# Patient Record
Sex: Female | Born: 1940
Health system: Southern US, Community
[De-identification: ages and names within clinical notes are randomized; demographics above are authoritative.]

## PROBLEM LIST (undated history)

## (undated) DIAGNOSIS — G629 Polyneuropathy, unspecified: Secondary | ICD-10-CM

## (undated) DIAGNOSIS — R0600 Dyspnea, unspecified: Secondary | ICD-10-CM

## (undated) DIAGNOSIS — G4730 Sleep apnea, unspecified: Secondary | ICD-10-CM

## (undated) DIAGNOSIS — Z8719 Personal history of other diseases of the digestive system: Secondary | ICD-10-CM

## (undated) DIAGNOSIS — J449 Chronic obstructive pulmonary disease, unspecified: Secondary | ICD-10-CM

## (undated) DIAGNOSIS — D649 Anemia, unspecified: Secondary | ICD-10-CM

## (undated) DIAGNOSIS — Z87442 Personal history of urinary calculi: Secondary | ICD-10-CM

## (undated) DIAGNOSIS — Z8711 Personal history of peptic ulcer disease: Secondary | ICD-10-CM

## (undated) DIAGNOSIS — Z4542 Encounter for adjustment and management of neuropacemaker (brain) (peripheral nerve) (spinal cord): Secondary | ICD-10-CM

## (undated) DIAGNOSIS — R000 Tachycardia, unspecified: Secondary | ICD-10-CM

## (undated) DIAGNOSIS — M1990 Unspecified osteoarthritis, unspecified site: Secondary | ICD-10-CM

## (undated) DIAGNOSIS — K219 Gastro-esophageal reflux disease without esophagitis: Secondary | ICD-10-CM

## (undated) DIAGNOSIS — I82409 Acute embolism and thrombosis of unspecified deep veins of unspecified lower extremity: Secondary | ICD-10-CM

## (undated) DIAGNOSIS — I10 Essential (primary) hypertension: Secondary | ICD-10-CM

## (undated) HISTORY — PX: ARTHOSCOPIC ROTAOR CUFF REPAIR: SHX5002

## (undated) HISTORY — PX: EYE SURGERY: SHX253

## (undated) HISTORY — PX: APPENDECTOMY: SHX54

## (undated) HISTORY — PX: ABDOMINAL HYSTERECTOMY: SHX81

## (undated) HISTORY — PX: BACK SURGERY: SHX140

---
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
## 1964-05-11 HISTORY — PX: OTHER SURGICAL HISTORY: SHX169

## 1997-05-18 HISTORY — PX: BREAST BIOPSY: SHX20

## 1997-08-22 ENCOUNTER — Encounter: Admission: RE | Admit: 1997-08-22 | Discharge: 1997-11-20 | Payer: Self-pay | Admitting: Internal Medicine

## 1997-12-19 ENCOUNTER — Encounter: Admission: RE | Admit: 1997-12-19 | Discharge: 1998-03-19 | Payer: Self-pay | Admitting: Internal Medicine

## 1998-07-16 ENCOUNTER — Ambulatory Visit (HOSPITAL_COMMUNITY): Admission: RE | Admit: 1998-07-16 | Discharge: 1998-07-16 | Payer: Self-pay | Admitting: Obstetrics & Gynecology

## 1998-07-26 ENCOUNTER — Ambulatory Visit (HOSPITAL_COMMUNITY): Admission: RE | Admit: 1998-07-26 | Discharge: 1998-07-26 | Payer: Self-pay | Admitting: Neurosurgery

## 1998-07-26 ENCOUNTER — Encounter: Payer: Self-pay | Admitting: Neurosurgery

## 1998-08-08 ENCOUNTER — Ambulatory Visit (HOSPITAL_COMMUNITY): Admission: RE | Admit: 1998-08-08 | Discharge: 1998-08-08 | Payer: Self-pay | Admitting: Neurosurgery

## 1998-08-08 ENCOUNTER — Encounter: Payer: Self-pay | Admitting: Neurosurgery

## 1998-09-16 ENCOUNTER — Ambulatory Visit (HOSPITAL_COMMUNITY): Admission: RE | Admit: 1998-09-16 | Discharge: 1998-09-16 | Payer: Self-pay | Admitting: Neurosurgery

## 1998-09-16 ENCOUNTER — Encounter: Payer: Self-pay | Admitting: Neurosurgery

## 1998-09-24 ENCOUNTER — Encounter: Admission: RE | Admit: 1998-09-24 | Discharge: 1998-11-21 | Payer: Self-pay | Admitting: Neurosurgery

## 2001-10-17 ENCOUNTER — Ambulatory Visit: Admission: RE | Admit: 2001-10-17 | Discharge: 2001-10-17 | Payer: Self-pay | Admitting: Internal Medicine

## 2002-04-17 ENCOUNTER — Ambulatory Visit (HOSPITAL_COMMUNITY): Admission: RE | Admit: 2002-04-17 | Discharge: 2002-04-17 | Payer: Self-pay | Admitting: Gastroenterology

## 2002-05-11 HISTORY — PX: KNEE ARTHROSCOPY: SUR90

## 2003-10-26 ENCOUNTER — Encounter: Admission: RE | Admit: 2003-10-26 | Discharge: 2003-10-26 | Payer: Self-pay | Admitting: Internal Medicine

## 2003-11-14 ENCOUNTER — Inpatient Hospital Stay (HOSPITAL_COMMUNITY): Admission: RE | Admit: 2003-11-14 | Discharge: 2003-11-22 | Payer: Self-pay | Admitting: Neurosurgery

## 2003-12-13 ENCOUNTER — Encounter: Admission: RE | Admit: 2003-12-13 | Discharge: 2004-03-12 | Payer: Self-pay | Admitting: Neurosurgery

## 2004-03-13 ENCOUNTER — Encounter: Admission: RE | Admit: 2004-03-13 | Discharge: 2004-06-11 | Payer: Self-pay | Admitting: Neurosurgery

## 2005-05-10 ENCOUNTER — Encounter: Admission: RE | Admit: 2005-05-10 | Discharge: 2005-05-10 | Payer: Self-pay | Admitting: Internal Medicine

## 2007-12-21 ENCOUNTER — Ambulatory Visit (HOSPITAL_COMMUNITY): Admission: RE | Admit: 2007-12-21 | Discharge: 2007-12-22 | Payer: Self-pay | Admitting: Orthopedic Surgery

## 2008-01-23 ENCOUNTER — Encounter: Admission: RE | Admit: 2008-01-23 | Discharge: 2008-04-03 | Payer: Self-pay | Admitting: Orthopedic Surgery

## 2010-01-21 ENCOUNTER — Ambulatory Visit (HOSPITAL_COMMUNITY): Admission: RE | Admit: 2010-01-21 | Discharge: 2010-01-21 | Payer: Self-pay | Admitting: Orthopedic Surgery

## 2010-03-20 ENCOUNTER — Ambulatory Visit (HOSPITAL_COMMUNITY): Admission: RE | Admit: 2010-03-20 | Discharge: 2010-03-20 | Payer: Self-pay | Admitting: Gastroenterology

## 2010-05-11 HISTORY — PX: JOINT REPLACEMENT: SHX530

## 2010-07-22 LAB — GLUCOSE, CAPILLARY
Glucose-Capillary: 120 mg/dL — ABNORMAL HIGH (ref 70–99)
Glucose-Capillary: 56 mg/dL — ABNORMAL LOW (ref 70–99)

## 2010-07-24 LAB — COMPREHENSIVE METABOLIC PANEL
ALT: 20 U/L (ref 0–35)
AST: 24 U/L (ref 0–37)
Albumin: 3.8 g/dL (ref 3.5–5.2)
Alkaline Phosphatase: 44 U/L (ref 39–117)
BUN: 20 mg/dL (ref 6–23)
CO2: 28 mEq/L (ref 19–32)
Calcium: 10 mg/dL (ref 8.4–10.5)
Chloride: 106 mEq/L (ref 96–112)
Creatinine, Ser: 1.22 mg/dL — ABNORMAL HIGH (ref 0.4–1.2)
GFR calc Af Amer: 53 mL/min — ABNORMAL LOW (ref >60)
GFR calc non Af Amer: 44 mL/min — ABNORMAL LOW (ref >60)
Glucose, Bld: 106 mg/dL — ABNORMAL HIGH (ref 70–99)
Potassium: 4.3 mEq/L (ref 3.5–5.1)
Sodium: 141 mEq/L (ref 135–145)
Total Bilirubin: 0.6 mg/dL (ref 0.3–1.2)
Total Protein: 6.9 g/dL (ref 6.0–8.3)

## 2010-07-24 LAB — URINALYSIS, ROUTINE W REFLEX MICROSCOPIC
Bilirubin Urine: NEGATIVE
Glucose, UA: NEGATIVE mg/dL
Hgb urine dipstick: NEGATIVE
Ketones, ur: NEGATIVE mg/dL
Nitrite: NEGATIVE
Protein, ur: NEGATIVE mg/dL
Specific Gravity, Urine: 1.022 (ref 1.005–1.030)
Urobilinogen, UA: 0.2 mg/dL (ref 0.0–1.0)
pH: 8 (ref 5.0–8.0)

## 2010-07-24 LAB — CBC
HCT: 35.8 % — ABNORMAL LOW (ref 36.0–46.0)
HCT: 36.8 % (ref 36.0–46.0)
Hemoglobin: 11.9 g/dL — ABNORMAL LOW (ref 12.0–15.0)
Hemoglobin: 12.6 g/dL (ref 12.0–15.0)
MCH: 27.6 pg (ref 26.0–34.0)
MCH: 27.9 pg (ref 26.0–34.0)
MCHC: 33.3 g/dL (ref 30.0–36.0)
MCHC: 34.3 g/dL (ref 30.0–36.0)
MCV: 81.4 fL (ref 78.0–100.0)
MCV: 82.7 fL (ref 78.0–100.0)
Platelets: 256 10*3/uL (ref 150–400)
Platelets: 270 10*3/uL (ref 150–400)
RBC: 4.33 MIL/uL (ref 3.87–5.11)
RBC: 4.53 MIL/uL (ref 3.87–5.11)
RDW: 14.6 % (ref 11.5–15.5)
RDW: 14.6 % (ref 11.5–15.5)
WBC: 7.3 10*3/uL (ref 4.0–10.5)
WBC: 8.2 10*3/uL (ref 4.0–10.5)

## 2010-07-24 LAB — DIFFERENTIAL
Basophils Absolute: 0 10*3/uL (ref 0.0–0.1)
Basophils Relative: 0 % (ref 0–1)
Eosinophils Absolute: 0.2 10*3/uL (ref 0.0–0.7)
Eosinophils Relative: 3 % (ref 0–5)
Lymphocytes Relative: 35 % (ref 12–46)
Lymphs Abs: 2.5 10*3/uL (ref 0.7–4.0)
Monocytes Absolute: 0.7 10*3/uL (ref 0.1–1.0)
Monocytes Relative: 9 % (ref 3–12)
Neutro Abs: 3.8 10*3/uL (ref 1.7–7.7)
Neutrophils Relative %: 52 % (ref 43–77)

## 2010-07-24 LAB — SURGICAL PCR SCREEN
MRSA, PCR: NEGATIVE
Staphylococcus aureus: NEGATIVE

## 2010-07-24 LAB — PROTIME-INR
INR: 0.84 (ref 0.00–1.49)
Prothrombin Time: 11.7 seconds (ref 11.6–15.2)

## 2010-07-24 LAB — APTT: aPTT: 27 seconds (ref 24–37)

## 2010-07-24 LAB — GLUCOSE, CAPILLARY: Glucose-Capillary: 114 mg/dL — ABNORMAL HIGH (ref 70–99)

## 2010-09-23 NOTE — Op Note (Signed)
NAMEJORDIS, [name]             ACCOUNT NO.:  [id number]   MEDICAL RECORD NO.:  [id number]          PATIENT TYPE:  AMB   LOCATION:  DAY                          FACILITY:  [facility]   PHYSICIAN:  Ronald A. Gioffre, M.D.DATE OF BIRTH:  February 24, 1941   DATE OF PROCEDURE:  12/21/2007  DATE OF DISCHARGE:                               OPERATIVE REPORT   SURGEON:  Dr. Darrelyn Hillock   ASSISTANT:  Arlyn Leak, PA.   PREOPERATIVE DIAGNOSIS:  1. Severe impingement syndrome, left shoulder.  2. Torn rotator cuff tendon, right shoulder.   POSTOPERATIVE DIAGNOSIS:  1. Severe impingement syndrome, left shoulder.  2. Torn rotator cuff tendon, right shoulder.   OPERATION:  1. Partial acromionectomy and acromioplasty, left shoulder.  2. Repair of a torn rotator cuff tendon.  She had a central tear.  3. A tissue mend graft to reinforce the tear of the rotator cuff, left      shoulder.  4. One PEEK anchor was used.   PROCEDURE:  Under general anesthesia, routine orthopedic prep and drape  of the left shoulder was carried out.  Prior to being brought back to  the operating room, she had an interscalene nerve block.  At that time  she also had 1 gram of IV Ancef.  After sterile prep and drape was  carried out, an incision was made down the anterior aspect of the left  shoulder.  Bleeders were identified and cauterized.  I separated the  deltoid tendon from the acromion by sharp dissection.  Following that, I  protected the cuff and I partially incised the proximal part of the  deltoid muscle.  I went down and excised the subdeltoid bursa, which was  quite thickened and full of fluid.  I then went down and noted a large  tear in the rotator cuff.  It was actually a central-type tear.  I  gently retracted the cuff with the retractors and then utilized a bur to  bur down the lateral articular surface of the humerus.  Once a good  bleeding bone bed was established, I then irrigated the area out.  Prior  to  doing that we did a routine acromioplasty and a partial  acromionectomy utilizing an oscillating saw and a bur.  We did protect  the cuff during that procedure.  We thoroughly irrigated out the area as  well.  I then inserted 1 PEEK anchor with 4 sutures.  I brought 2  sutures up through the cuff and sutured the cuff down to the bone and  then reinforced the cuff repair with a tissue mend graft utilizing the  other 2 sutures from the anchor.  We had a nice repair.  We thoroughly  repaired the tendon  circumferentially.  I thoroughly irrigated out the area.  We now had  good reestablishment of the subacromial space.  I then reapproximated  the deltoid tendon and muscle in the usual fashion.  The subcu was  closed with 0 Vicryl, the skin with metal staples.  A sterile Neosporin  dressing was applied.  She was placed in a shoulder immobilizer.  ______________________________  Georges Lynch Darrelyn Hillock, M.D.     RAG/MEDQ  D:  12/21/2007  T:  12/21/2007  Job:  [id number]   cc:   Windy Fast A. Darrelyn Hillock, M.D.  Fax: [phone]

## 2010-09-26 NOTE — H&P (Signed)
[name], [name]                         ACCOUNT NO.:  [id number]   MEDICAL RECORD NO.:  [id number]                   PATIENT TYPE:  INP   LOCATION:  2899                                 FACILITY:  [facility]   PHYSICIAN:  Hilda Lias, M.D.                DATE OF BIRTH:  07/05/1940   DATE OF ADMISSION:  11/14/2003  DATE OF DISCHARGE:                                HISTORY & PHYSICAL   CLINICAL HISTORY:  Ms. [name] is a lady who has been seen by me since 1997.  At that time, she underwent a L4-5 diskectomy. She came to see me  complaining of worsening back pain radiation to both her legs, right worse  than the left one. The patient has had conservative treatment, and she is  not any better. X-rays were obtained. Because of the findings, she wanted to  proceed with surgery.   PAST MEDICAL HISTORY:  1. L4-5 diskectomy in 1997.  2. Hysterectomy.  3. Tonsillectomy.  4. Ovarian resection.   ALLERGIES:  She is allergic to HYDROCHLOROTHIAZIDE, PERCOCET. She is also  allergic to CODEINE.   SOCIAL HISTORY:  Negative.   FAMILY HISTORY:  Mother died of cerebral hemorrhage. Father died of coronary  artery thrombosis.   REVIEW OF SYSTEMS:  Positive for back and bilateral leg pain.   PHYSICAL EXAMINATION:  GENERAL:  The patient came to my office with her  husband. She was walking with a short step. She had difficulty sitting or  standing.  HEENT:  Head, ears, and throat normal.  NECK:  Normal.  LUNGS:  Clear.  HEART SOUNDS:  Normal.  ABDOMEN:  Normal.  EXTREMITIES:  Normal pulses.  NEUROLOGICAL:  Mental status normal. Cranial nerves normal. Strength 5/5 in  the upper and lower extremities except for weakness and dorsi flexion of  both feet. Straight leg raising is positive bilaterally 45 degrees.  Sensation is normal. She has decrease in flexibility of the lumbar spine.   STUDIES:  X-rays show severe case of degenerative disk disease at the level  of 4-5 and 5-1. The MRI showed  _____________ with no evidence of any  spondylolisthesis.   CLINICAL IMPRESSION:  Degenerative disk disease, 4-5/5-1, with bilateral  radiculopathy.   RECOMMENDATIONS:  The patient is being admitted for L4-5/5-1 diskectomy  fusion with pedicle screws. She and her husband know about the risks such as  infection, CSF leak, worsening pain, damage to the vessel in the abdomen,  failure of the bone graft, need for further surgery.                                                Hilda Lias, M.D.    EB/MEDQ  D:  11/14/2003  T:  11/14/2003  Job:  [id number]

## 2010-09-26 NOTE — Op Note (Signed)
   NAMEMARIACHRISTINA, [name]                         ACCOUNT NO.:  [id number]   MEDICAL RECORD NO.:  [id number]                   PATIENT TYPE:  AMB   LOCATION:  ENDO                                 FACILITY:  [facility]   PHYSICIAN:  Danise Edge, M.D.                DATE OF BIRTH:  1940-09-01   DATE OF PROCEDURE:  04/17/2002  DATE OF DISCHARGE:                                 OPERATIVE REPORT   PROCEDURE PERFORMED:  Colonoscopy.   REFERRING PHYSICIAN:  Candyce Churn, M.D.   ENDOSCOPIST:  Charolett Bumpers, M.D.   INDICATIONS FOR PROCEDURE:  The patient is a 70 year old female.  The  patient submitted stool cards for Hemoccult testing; one of six stool cards  was positive for blood.   PREMEDICATION:  Demerol 50 mg, Versed 10 mg.   INSTRUMENT USED:  Olympus pediatric colonoscope.   DESCRIPTION OF PROCEDURE:  After obtaining informed consent, the patient was  placed in the left lateral decubitus position.  I administered intravenous  Demerol and intravenous Versed to achieve conscious sedation for the  procedure.  The patient's blood pressure, oxygen saturations and cardiac  rhythm were monitored throughout the procedure and documented in the medical  record.   Anal inspection was normal.  Digital rectal exam was normal.  The pediatric  Olympus video colonoscope was introduced into the rectum and advanced to the  cecum.  Colonic preparation for the exam today was satisfactory.   Rectum:  Normal.   Sigmoid colon and descending colon:  Normal.   Splenic flexure:  Normal.   Transverse colon:  Normal.   Hepatic flexure:  Normal.   Ascending colon:  Normal.   Cecum and ileocecal valve:  Normal.    ASSESSMENT:  Normal proctocolonoscopy to the cecum.  No endoscopic evidence  for the presence of lower gastrointestinal bleeding or colorectal neoplasia.                                                   Danise Edge, M.D.    MJ/MEDQ  D:  04/17/2002  T:  04/17/2002   Job:  [id number]

## 2010-09-26 NOTE — Op Note (Signed)
NAMEJAYLIANI, [name]                         ACCOUNT NO.:  [id number]   MEDICAL RECORD NO.:  [id number]                   PATIENT TYPE:  INP   LOCATION:  3172                                 FACILITY:  [facility]   PHYSICIAN:  Hilda Lias, M.D.                DATE OF BIRTH:  06/13/1940   DATE OF PROCEDURE:  11/14/2003  DATE OF DISCHARGE:                                 OPERATIVE REPORT   PREOPERATIVE DIAGNOSIS:  Degenerative disk disease, 4-5, 5-1, with bilateral  radiculopathy, right worse than the left one.   POSTOPERATIVE DIAGNOSIS:  Degenerative disk disease, 4-5, 5-1, with  bilateral radiculopathy, right worse than the left one.   PROCEDURE:  1. Bilateral 4-5 laminectomy.  2. Bilateral 4-5 diskectomy.  3. Interbody fusion with allograft 4-5.  4. Foraminotomy.  5. Facetectomy.  6. Decompression of the L4, L5, S1 nerve root.  7. Lysis of adhesions.  8. Pedicle screws from L4 to S1 bilaterally with crosslink.  9. Posterolateral arthrodesis without doing allograft.  10.      Cell-saver.  11.      C-arm.   SURGEON:  Dr. Jeral Fruit   ASSISTANT:  Dr. Danielle Dess.   CLINICAL HISTORY:  The patient was admitted because of back pain, radiating  down to both legs.  The patient in 1977 underwent a 5-1 diskectomy on the  right side.  The patient has a severe case of degenerative disk disease with  chronic radiculopathy.  The patient wants to proceed with surgery.  The  risks were explained in the history and physical.   DESCRIPTION OF PROCEDURE:  The patient was taken to the OR and after  intubation, she was positioned in a prone manner.  The back was prepped with  Betadine.  A midline incision following the previous one was made from L4 to  S1.  Muscle was retracted laterally.  We proceeded with removal of spinous  process and lamina of 4 and 5.  This was done with the Leksell as well as  the drill.  Indeed, we found quite a bit of adhesion.  Although the patient  had surgery on the  right side, the patient had more adhesions in the left  side.  Decompression of the L4, L5, S1 nerve root was done.  Facetectomy was  achieved.  We knew by x-ray that she was almost completely fused at L5-S1.  From then on, we entered the disk space bilaterally, and a total diskectomy  was achieved.  Having done this, the endplate was removed with a curette.  A  piece of bone graft of 8 x 24 was introduced bilaterally and in the midline  as well as laterally.  A mix of allograft and VITOSS was used to fill up the  disk space.  From then on, using the C-arm, we identified the pedicle screw  for 4, 5, and 1.  Cobb was used, and the 6 screws ,  4 being of 6.5 x 45 and  1 of 5.5 x 45 were introduced.  AP and lateral showed good position of the  bone graft as well as the pedicle screws.  Then a rod was applied followed  by caps.  These were tightened in place.  Then a crosslink from right to  left was used.  Then we identified the ala of the sacrum as well as the  transverse process of 4-5.  Within this area, a mix of VITOSS as well as  autograft was used posterolaterally through this.  Having good  decompression, the ___________.  There was a small opening in the dura which  was taken care of with Prolene.  Tisseal was used in the dural space.  From  then on, Valsalva maneuver was negative, and the wound was closed with  Vicryl and Steri-Strips.  The patient did really well.                                               Hilda Lias, M.D.    EB/MEDQ  D:  11/14/2003  T:  11/14/2003  Job:  [id number]

## 2010-09-26 NOTE — Discharge Summary (Signed)
[name], [name]                         ACCOUNT NO.:  [id number]   MEDICAL RECORD NO.:  [id number]                   PATIENT TYPE:  INP   LOCATION:  3009                                 FACILITY:  [facility]   PHYSICIAN:  Hilda Lias, M.D.                DATE OF BIRTH:  02/03/1941   DATE OF ADMISSION:  11/14/2003  DATE OF DISCHARGE:  11/22/2003                                 DISCHARGE SUMMARY   ADMISSION DIAGNOSES:  Degenerative disc disease of L4-L5, L5-S1 with  bilateral radiculopathy.   FINAL DIAGNOSES:  1. Degenerative disc disease L4-L5, L5-S1 with bilateral radiculopathy.  2. Hyperglycemia and hypoglycemia, diabetes mellitus.  3. Hypertension.   HISTORY OF PRESENT ILLNESS:  The patient is a lady who underwent L4-L5  diskectomy on the right side back in 1999.  Lately, for the past year, the  patient has been complaining of back pain with radiation mostly to the right  leg.  The patient has failed conservative treatment.  The patient had x-rays  which showed degenerative disc disease at the L4-L5 and L5-S1 level.   LABORATORY DATA:  At the present time white blood cells 7.1, hemoglobin  12.1.  His urine is pending.  Initially glucose on admission was 129 and  during hospitalization the glucose went from 250 to 360.  ___________  yesterday, came down to 113.   HOSPITAL COURSE:  The patient was taken to surgery and L4-L5, L5-S1  diskectomy with interbody fusion was done.  During surgery we found she had  quite a bit scar tissue, interestingly, mostly in the left side.  Lysis was  accomplished.  After surgery the patient had no pain but she had 0-1  weakness dorsiflexion of the left foot.  She also complained of numbness  affecting the L5 nerve root.  With the use of Decadron we were able to  decrease the swelling.  Because the patient did not improve I did a CT scan  of the lumbar area which showed the possibility of epidural hematoma.  Interesting of note the so-called  epidural hematoma was in the left side,  not in the right side.  I was suspicious that this might be the glue that we  used during surgery to cover the dural matter.  Because the patient did not  improve, I took her back to surgery on Saturday, November 17, 2003.  We invested  the area with more lysis of adhesions and indeed there was no evidence of an  epidural hematoma.  All the screws were in place and there was no evidence  of any screw close to the nerve.  After surgery she was continued on  Decadron and her glucose continued to fluctuate, going over 350.  She had  been seen by physical therapy.  A special brace was made for her left foot.  The patient, for the past few days, has fluctuated with her glucose levels  and thus this was the main reason we decided to keep her until today to be  sure she did not develop any hyperglycemia or hypoglycemia situation which  might cause her to lose consciousness, fall and damage her spine.  Right now  her glucose is under control and therefore she is being discharged today.   CONDITION ON DISCHARGE:  Improved in relationship to her pain, still some  numbness in the foot with weakness of dorsiflexion of the left.   DISCHARGE MEDICATIONS:  Patient will continue to take Demerol, Neurontin and  Flexeril.  She is going to continue taking her medications for diabetes and  blood pressure.   ACTIVITY:  Patient is encouraged to walk and she is going to be seen by  physical therapy at home.   DIET:  She is to continue with her ADA diet.   FOLLOW UP:  She is going to come to my office in four weeks in follow up.  She is going to give me a call in two weeks to let me know how she is coming  along.  If she continues to have burning we are going to increase the  Neurontin from 300 mg three times a day to 600 mg three times a day.  Again,  the reason for this lady to be kept in the hospital two extra days was to  prevent the possibility of changes in mental  status secondary to  hypoglycemia.   Incidentally, this morning, November 22, 2003, she had an episode where she went  down onto the floor and she was helped back into her bed by personnel.                                                Hilda Lias, M.D.    EB/MEDQ  D:  11/22/2003  T:  11/22/2003  Job:  [id number]

## 2010-09-26 NOTE — Op Note (Signed)
[name], [name]                         ACCOUNT NO.:  [id number]   MEDICAL RECORD NO.:  [id number]                   PATIENT TYPE:  INP   LOCATION:  3009                                 FACILITY:  [facility]   PHYSICIAN:  Hilda Lias, M.D.                DATE OF BIRTH:  1940/06/27   DATE OF PROCEDURE:  11/17/2003  DATE OF DISCHARGE:                                 OPERATIVE REPORT   PREOPERATIVE DIAGNOSIS:  Status post L4-5, L5-S1 fusion.  Weakness of the  left foot.  Rule out epidural hematoma.   POSTOPERATIVE DIAGNOSIS:  Status post L4-5, L5-S1 fusion.  Lysis of  adhesions.  Microscope.   OPERATION PERFORMED:   SURGEON:  Hilda Lias, M.D.   ASSISTANT:  Coletta Memos, M.D.   ANESTHESIA:  General.   INDICATIONS FOR PROCEDURE:  [name] was admitted because of back pain  with radiation to both legs, right worse than the left one.  Previously she  had surgery  back in 1999.  Fusion at the level of 4-5, 5-1 with pedicle  screw was done.  We found quite a bit of scar tissue, mostly in the left  side involving the L5 nerve root and the S1.  Nevertheless, she also had  some in the right side where she had previously surgery.  During the  procedure there was a small opening in the dura mater and a single stitch of  Prolene was used.  After surgery, the patient developed 1/5 weakness of  dorsiflexion on the left foot with normal plantar flexion. We waited 48  hours to see if that would improve.  Because of no improvement, a CT scan of  the lumbar spine was done.  It was really difficult to read. It was read by  Dr. Sherrie Mustache but later on I saw him with Dr. Tyron Russell and Dr. Lyman Bishop.  They  advised to do an MRI.  Knowing that probably MRI won't be of any help, I  talked to the patient and we agreed with exploration.   DESCRIPTION OF PROCEDURE:  The patient was taken to the operating room and  was positioned in prone manner.  The back was prepped with Betadine.  Midline incision  was made.  There was a small amount of serosanguineous  fluid which was removed.  Indeed, we brought the microscope into the area.  There was no evidence of any hematoma.  We did the Valsalva maneuver which  was essentially negative. There was some scar tissue right underneath the L5  nerve root and that was removed.  The foramen was opened not only for the L4  but also L5-S1.  The pedicle screw were in good position.  Then with the  help of the microscope we removed the stitch of Prolene which was right at  the take off of the L5 nerve root.  Indeed we explored and also there was  some scar tissue  there was no major compromise of the L5 nerve root.  The  dura mater was seen.  In the dural sac itself we put a single stitch of  Prolene to stop the CSF leak. In the area where the nerve root was going  through the foramen we decided not to put any stitch but we used Duragen to  cover the nerve root.  On top of that we used Tisseel.  Then the Valsalva  maneuver was negative and the wound was closed with Vicryl and Steri-Strip.                                               Hilda Lias, M.D.    EB/MEDQ  D:  11/17/2003  T:  11/19/2003  Job:  [id number]

## 2010-09-26 NOTE — Consult Note (Signed)
NAMEJULETTA, [name]                         ACCOUNT NO.:  [id number]   MEDICAL RECORD NO.:  [id number]                   PATIENT TYPE:  INP   LOCATION:  3009                                 FACILITY:  [facility]   PHYSICIAN:  Hilda Lias, M.D.                DATE OF BIRTH:  Apr 21, 1941   DATE OF CONSULTATION:  11/16/2003  DATE OF DISCHARGE:                                   CONSULTATION   [name] is a lady who was taken to surgery on July 6 by me for L4-5, L5-  S1 diskectomy with fusion.  Dr. Barnett Abu helped me during the case.  The  procedure went really well, although she had surgery previously at the level  of 4-5 on the right side with mostly scar tissue on the left side affecting  the S1 nerve root.  The procedure was done with fluoroscopy, and the pedicle  screws were in the normal posthion.  After surgery, the patient came back  complaining of numbness and weakness of the left foot which was 1 to 2/5 and  eventually right now is 1/5.  She denies any back pain or any pain in the  right leg.  There is no shooting pain in the left leg.  She denies any  burning sensation in the left foot.  Although during surgery we had to do  quite a bit of reduction of the L5 nerve root; nevertheless, I have been  concerned about the weakness in the left foot.  Today, knowing that the CT  scan can be a little bit misleading, I did a CT scan of the lumbar spine  which showed that the pedicle screws were in normal position except that  probably the L4 might be a little bit medial.  Nevertheless, it is not  affecting either the L4 or L5 nerve root.  The one at the L5-S1 is normal.   Dr. Sherrie Mustache, from the x-ray department, called me while I was in surgery to  let me know that the CT scan showed a possibility of epidural hematoma at  the level of 4-5 and 5-1 on the left side.  Since I knew after surgery I  left Tisseel in the epidural space, I was concerned that what they were  calling a  hematoma was probably the Tisseel.  After surgery, I went down.  I  talked to Dr. Lyman Bishop and to Dr. Tyron Russell.  We looked at the CT scan in the  hospital location.  It is difficult to say if indeed this is an epidural  hematoma or might be the Tisseel in that area.  One thing works against  epidural hematoma is the location is midline to the left.  It does not go to  the right side.  We talked about doing an MRI and probably the MRI might  give [place] a clue about what is going on.  Nevertheless, I was  really concerned  that doing the MRI we might end up having the same confusion about what is  going on in the epidural space.   I came to [name].  She just finished eating dinner.  I explained to her  what my dilemma is.  I told her repeating the MRI might be helpful, but it  might add to the confusion about the epidural space.  I told her that in one  way I would like to wait and see how she develops.  On the other hand, I  will be disappointed to sit and do nothing if this is indeed an epidural  hematoma.  Finally, looking at the pros and cons, we agreed to go ahead with  a exploration at level 4-5 tomorrow morning.  This procedure will not take  more than one-half hour since we are not going to be doing any changes in  the bone graft and pedicle screws.  I tried three times to see if I was able  to get hold of her husband, but he was not around.  She will be talking to  him, and if any questions, they will let me know.  I will be seeing her  again tomorrow morning.                                               Hilda Lias, M.D.    EB/MEDQ  D:  11/16/2003  T:  11/17/2003  Job:  [id number]

## 2010-10-23 ENCOUNTER — Other Ambulatory Visit: Payer: Self-pay | Admitting: Orthopedic Surgery

## 2010-10-23 ENCOUNTER — Other Ambulatory Visit (HOSPITAL_COMMUNITY): Payer: Self-pay | Admitting: Orthopedic Surgery

## 2010-10-23 ENCOUNTER — Encounter (HOSPITAL_COMMUNITY): Payer: Medicare Other

## 2010-10-23 ENCOUNTER — Ambulatory Visit (HOSPITAL_COMMUNITY)
Admission: RE | Admit: 2010-10-23 | Discharge: 2010-10-23 | Disposition: A | Discharge status: Home / Self Care | Payer: Medicare Other | Source: Ambulatory Visit | Attending: Orthopedic Surgery | Admitting: Orthopedic Surgery

## 2010-10-23 DIAGNOSIS — Z01810 Encounter for preprocedural cardiovascular examination: Secondary | ICD-10-CM | POA: Insufficient documentation

## 2010-10-23 DIAGNOSIS — M1711 Unilateral primary osteoarthritis, right knee: Secondary | ICD-10-CM

## 2010-10-23 DIAGNOSIS — E119 Type 2 diabetes mellitus without complications: Secondary | ICD-10-CM | POA: Insufficient documentation

## 2010-10-23 DIAGNOSIS — I10 Essential (primary) hypertension: Secondary | ICD-10-CM | POA: Insufficient documentation

## 2010-10-23 DIAGNOSIS — Z01812 Encounter for preprocedural laboratory examination: Secondary | ICD-10-CM | POA: Insufficient documentation

## 2010-10-23 DIAGNOSIS — M1710 Unilateral primary osteoarthritis, unspecified knee: Secondary | ICD-10-CM | POA: Insufficient documentation

## 2010-10-23 LAB — COMPREHENSIVE METABOLIC PANEL
ALT: 15 U/L (ref 0–35)
AST: 20 U/L (ref 0–37)
Albumin: 3.3 g/dL — ABNORMAL LOW (ref 3.5–5.2)
Alkaline Phosphatase: 51 U/L (ref 39–117)
BUN: 23 mg/dL (ref 6–23)
CO2: 26 mEq/L (ref 19–32)
Calcium: 10 mg/dL (ref 8.4–10.5)
Chloride: 103 mEq/L (ref 96–112)
Creatinine, Ser: 1.02 mg/dL (ref 0.4–1.2)
GFR calc Af Amer: >60 mL/min (ref >60)
GFR calc non Af Amer: 54 mL/min — ABNORMAL LOW (ref >60)
Glucose, Bld: 78 mg/dL (ref 70–99)
Potassium: 4.1 mEq/L (ref 3.5–5.1)
Sodium: 139 mEq/L (ref 135–145)
Total Bilirubin: 0.3 mg/dL (ref 0.3–1.2)
Total Protein: 7.1 g/dL (ref 6.0–8.3)

## 2010-10-23 LAB — URINALYSIS, ROUTINE W REFLEX MICROSCOPIC
Bilirubin Urine: NEGATIVE
Glucose, UA: NEGATIVE mg/dL
Hgb urine dipstick: NEGATIVE
Ketones, ur: NEGATIVE mg/dL
Leukocytes, UA: NEGATIVE
Nitrite: NEGATIVE
Protein, ur: NEGATIVE mg/dL
Specific Gravity, Urine: 1.02 (ref 1.005–1.030)
Urobilinogen, UA: 0.2 mg/dL (ref 0.0–1.0)
pH: 5.5 (ref 5.0–8.0)

## 2010-10-23 LAB — PROTIME-INR
INR: 0.95 (ref 0.00–1.49)
Prothrombin Time: 12.9 seconds (ref 11.6–15.2)

## 2010-10-23 LAB — APTT: aPTT: 29 seconds (ref 24–37)

## 2010-10-23 LAB — SURGICAL PCR SCREEN
MRSA, PCR: NEGATIVE
Staphylococcus aureus: NEGATIVE

## 2010-10-29 ENCOUNTER — Inpatient Hospital Stay (HOSPITAL_COMMUNITY): Payer: Medicare Other

## 2010-10-29 ENCOUNTER — Inpatient Hospital Stay (HOSPITAL_COMMUNITY)
Admission: RE | Admit: 2010-10-29 | Discharge: 2010-11-01 | DRG: 470 | Disposition: A | Discharge status: Home Health | Payer: Medicare Other | Source: Ambulatory Visit | Attending: Orthopedic Surgery | Admitting: Orthopedic Surgery

## 2010-10-29 DIAGNOSIS — D62 Acute posthemorrhagic anemia: Secondary | ICD-10-CM | POA: Diagnosis not present

## 2010-10-29 DIAGNOSIS — E669 Obesity, unspecified: Secondary | ICD-10-CM | POA: Diagnosis present

## 2010-10-29 DIAGNOSIS — E119 Type 2 diabetes mellitus without complications: Secondary | ICD-10-CM | POA: Diagnosis present

## 2010-10-29 DIAGNOSIS — M1710 Unilateral primary osteoarthritis, unspecified knee: Principal | ICD-10-CM | POA: Diagnosis present

## 2010-10-29 DIAGNOSIS — Z01812 Encounter for preprocedural laboratory examination: Secondary | ICD-10-CM

## 2010-10-29 DIAGNOSIS — K219 Gastro-esophageal reflux disease without esophagitis: Secondary | ICD-10-CM | POA: Diagnosis present

## 2010-10-29 DIAGNOSIS — Z86718 Personal history of other venous thrombosis and embolism: Secondary | ICD-10-CM

## 2010-10-29 DIAGNOSIS — I10 Essential (primary) hypertension: Secondary | ICD-10-CM | POA: Diagnosis present

## 2010-10-29 LAB — GLUCOSE, CAPILLARY
Glucose-Capillary: 137 mg/dL — ABNORMAL HIGH (ref 70–99)
Glucose-Capillary: 186 mg/dL — ABNORMAL HIGH (ref 70–99)
Glucose-Capillary: 215 mg/dL — ABNORMAL HIGH (ref 70–99)
Glucose-Capillary: 195 mg/dL — ABNORMAL HIGH (ref 70–99)
Glucose-Capillary: 154 mg/dL — ABNORMAL HIGH (ref 70–99)

## 2010-10-29 LAB — TYPE AND SCREEN
ABO/RH(D): B POS
Antibody Screen: NEGATIVE

## 2010-10-29 NOTE — H&P (Addendum)
[name], [name]             ACCOUNT NO.:  [id number]  MEDICAL RECORD NO.:  [id number]  LOCATION:  0003                         FACILITY:  [facility]  PHYSICIAN:  Georges Lynch. Ailana Cuadrado, M.D.DATE OF BIRTH:  August 02, 1940  DATE OF ADMISSION:  10/29/2010 DATE OF DISCHARGE:                             HISTORY & PHYSICAL   BRIEF HISTORY:  [name] has been followed for worsening pain in her right knee by Dr. Darrelyn Hillock.  She has undergone conservative measures such as injections with Celestone as well as Depo-Medrol.  Unfortunately she is getting less and less relief with these injections.  She feels she is also having increased difficulty with her daily activities and the knee pain is significantly limiting her activity.  [name] now presents for a right total knee arthroplasty.  ALLERGIES: 1. VIOXX. 2. REZULIN. 3. CYMBALTA. 4. HYDROCHLOROTHIAZIDE. 5. INDOCIN. 6. LYRICA. 7. NEOSPORIN. 8. VYTORIN. 9. ZOCOR.  CURRENT MEDICATIONS: 1. Metaglip. 2. Niacinamide. 3. Omeprazole 4. Allegra 5. Fish oil 6. Coenzyme Q10. 7. Crestor. 8. Levemir FlexPen. 9. Vitamin D. 10.Multivitamin with iron 11.Diovan 12.Byetta, 13.Estradiol. 14.Vicodin.  PRIMARY CARE PHYSICIAN:  Candyce Churn, MD  PAST MEDICAL HISTORY: 1. End-stage arthritis of the right knee. 2. History of bronchitis. 3. Hypertension. 4. Hyperlipidemia. 5. Reflux disease. 6. Gastric ulceration secondary to NSAID usage. 7. History of urinary tract infection, the last one was 3 to 4 years     ago. 8. History of kidney stones 15 to 20 years ago. 9. Non-insulin dependent diabetes. 10.History of DVT back in 2003 following a long flight 11.Arthritis 12.Degenerative disk disease. 13.History of measles, mumps and rubella as a child 80.History of menopause.  PAST SURGICAL HISTORY: 1. Right knee arthroscopy in 2011 2. Rotator cuff repair in 2009. 3. Spinal implants in 2003. 4. Vasectomy in 1999. 5. Partial  hysterectomy and then full hysterectomy. 6. Ovarian wedge resection. 7. Tonsillectomy in 1959.  She has done fine with anesthesia was all     of her surgeries.  FAMILY HISTORY:  Father passed at age of 34, had coronary thrombosis. Mother passed at age of 70, she had a brain aneurysm.  SOCIAL HISTORY:  The patient is married, retired.  Denies past or present use of alcohol or tobacco products.  She does plan to go home following her hospital stay.  So we will arrange for physical therapy to come out to the house.  REVIEW OF SYSTEMS:  GENERAL:  Negative for fevers, chills or weight change.  HEENT/NEURO:  Negative for headache, blurred vision or insomnia.  DERMATOLOGIC:  Positive for hives.  Negative for rash or lesion.  RESPIRATORY:  Negative for shortness of breath at rest or with exertion.  CARDIOVASCULAR:  Negative for chest pain or palpitations. GI:  Negative for nausea, vomiting, diarrhea.  GU: Negative for hematuria, dysuria.  MUSCULOSKELETAL:  Positive for muscular pain and back pain.  PHYSICAL EXAMINATION:  VITAL SIGNS:  Pulse 80, respirations 18, blood pressure 138/80 in the left arm. GENERAL:  [name] is alert and oriented x3.  She is well developed, well nourished, in no apparent distress.  She is a pleasant 70 year old female.  She has a stated height of 5 feet 6 inches  and stated weight of 190 pounds. HEENT:  Normocephalic, atraumatic.  Extraocular movements intact.  The patient wears reading glasses. NECK:  Supple full range of motion without lymphadenopathy. CHEST:  Lungs are clear to auscultation bilaterally without wheezes, rhonchi or rales. HEART:  Regular rate and rhythm without murmur. ABDOMEN:  Bowel sounds present in all 4 quadrants.  Abdomen is obese and nontender to palpation. EXTREMITIES:  Right knee, she has mild effusion, pain with palpation over the medial joint line.  No instability is noted.  She has marked crepitus throughout the range of motion.   Range is 5 to 100 degrees. SKIN:  Unremarkable. NEUROLOGIC:  Intact.  Peripheral vascular, carotid pulses 2+ bilaterally without bruit.  RADIOGRAPHS:  AP and lateral views of the right knee reveal end-stage arthritic changes tricompartmentally.  IMPRESSION:  End-stage arthritis of the right knee.  PLAN:  Right total knee arthroplasty to be performed by Dr. Darrelyn Hillock.     Rozell Searing, PAC   ______________________________ Georges Lynch Darrelyn Hillock, M.D.    LD/MEDQ  D:  10/29/2010  T:  10/29/2010  Job:  [id number]  Electronically Signed by Rozell Searing  on 10/29/2010 08:01:06 AM Electronically Signed by Ranee Gosselin M.D. on 11/15/2010 08:18:45 AM

## 2010-10-30 LAB — GLUCOSE, CAPILLARY
Glucose-Capillary: 154 mg/dL — ABNORMAL HIGH (ref 70–99)
Glucose-Capillary: 148 mg/dL — ABNORMAL HIGH (ref 70–99)
Glucose-Capillary: 171 mg/dL — ABNORMAL HIGH (ref 70–99)
Glucose-Capillary: 148 mg/dL — ABNORMAL HIGH (ref 70–99)

## 2010-10-30 LAB — PROTIME-INR
INR: 0.97 (ref 0.00–1.49)
Prothrombin Time: 13.1 seconds (ref 11.6–15.2)

## 2010-10-30 LAB — HEMOGLOBIN AND HEMATOCRIT, BLOOD
HCT: 32.3 % — ABNORMAL LOW (ref 36.0–46.0)
Hemoglobin: 10.5 g/dL — ABNORMAL LOW (ref 12.0–15.0)

## 2010-10-31 LAB — GLUCOSE, CAPILLARY
Glucose-Capillary: 129 mg/dL — ABNORMAL HIGH (ref 70–99)
Glucose-Capillary: 130 mg/dL — ABNORMAL HIGH (ref 70–99)
Glucose-Capillary: 196 mg/dL — ABNORMAL HIGH (ref 70–99)
Glucose-Capillary: 114 mg/dL — ABNORMAL HIGH (ref 70–99)

## 2010-10-31 LAB — HEMOGLOBIN AND HEMATOCRIT, BLOOD
HCT: 30.8 % — ABNORMAL LOW (ref 36.0–46.0)
Hemoglobin: 9.8 g/dL — ABNORMAL LOW (ref 12.0–15.0)

## 2010-10-31 LAB — PROTIME-INR
INR: 1.21 (ref 0.00–1.49)
Prothrombin Time: 15.6 seconds — ABNORMAL HIGH (ref 11.6–15.2)

## 2010-11-01 LAB — PROTIME-INR
INR: 1.53 — ABNORMAL HIGH (ref 0.00–1.49)
Prothrombin Time: 18.7 seconds — ABNORMAL HIGH (ref 11.6–15.2)

## 2010-11-01 LAB — HEMOGLOBIN AND HEMATOCRIT, BLOOD
HCT: 28.8 % — ABNORMAL LOW (ref 36.0–46.0)
Hemoglobin: 9.2 g/dL — ABNORMAL LOW (ref 12.0–15.0)

## 2010-11-01 LAB — GLUCOSE, CAPILLARY: Glucose-Capillary: 76 mg/dL (ref 70–99)

## 2010-11-05 NOTE — Discharge Summary (Addendum)
[name], [name]             ACCOUNT NO.:  [id number]  MEDICAL RECORD NO.:  [id number]  LOCATION:  1601                         FACILITY:  [facility]  PHYSICIAN:  Georges Lynch. Jaion Lagrange, M.D.DATE OF BIRTH:  09/20/1940  DATE OF ADMISSION:  10/29/2010 DATE OF DISCHARGE:  11/01/2010                              DISCHARGE SUMMARY   ADMITTING DIAGNOSES: 1. End-stage arthritis of the right knee. 2. History of bronchitis. 3. Hypertension. 4. Hyperlipidemia. 5. Reflux disease. 6. Gastric ulceration secondary to nonsteroidal anti-inflammatory     drugs usage. 7. History of urinary tract infection, the last one was about 3-4     years ago. 8. History of kidney stones about 15-20 years ago. 9. Non-insulin dependent diabetes. 10.History of deep venous thrombosis. 11.Arthritis. 12.Degenerative disk disease. 13.History of measles, mumps and rubella as a child. 14.History of menopause.  DISCHARGE DIAGNOSES: 1. End-stage arthritis of the right knee, status post right total knee     arthroplasty. 2. History of bronchitis. 3. Hypertension. 4. Hyperlipidemia. 5. Reflux disease. 6. Gastric ulceration secondary to nonsteroidal anti-inflammatory     drugs usage. 7. History of urinary tract infection, the last one was about 3-4     years ago. 8. History of kidney stones about 15-20 years ago. 9. Non-insulin dependent diabetes. 10.History of deep venous thrombosis. 11.Arthritis. 12.Degenerative disk disease. 13.History of measles, mumps and rubella as a child. 14.History of menopause.  LABORATORY DATA:  Throughout the patient's hospital stay, serial hemoglobin and hematocrits were taken.  On postoperative day #1, the patient's hemoglobin was 9.5.  On postop day #2, it  was 9.8.  On postop day #3, she was down to 9.2.  She did not require blood transfusion throughout the hospital stay.  The patient's INR was also monitored and by postoperative day #3, she was still subtherapeutic at  1.53.  PROCEDURES:  On October 29, 2010, Ms. [name] was taken to the operating room by surgeon Dr. Ranee Gosselin, assistant, Rozell Searing, PA-C. She underwent a right total knee arthroplasty.  The procedure was performed under general anesthesia.  Routine orthopedic prep and drape were carried out.  She received IV antibiotics preoperatively.  There are no complications with the procedure.  The patient was returned to the recovery room in satisfactory condition.  Postoperative radiographs revealed good position and alignment following the right total knee arthroplasty with no obvious complications.  HOSPITAL COURSE:  Ms. Crandell was admitted to [facility] on October 29, 2010.  She underwent the above-stated procedure without complication.  After adequate time in the recovery room, she was taken to the orthopedic floor.  She was placed on reduced dose PCA Dilaudid and muscle relaxant for pain control.  On postoperative day #1, the patient's Hemovac was pulled and her PCA was discontinued.  She was visited by physical therapy and she was able to ambulate 80 feet twice using the rolling walker in the morning session and then 6 feet twice using the rolling walker in the afternoon session.  On postoperative day #2, she was able to ambulate 100 feet in the morning session and 175 in the afternoon session.  She was able to work on stairs and on postoperative day #3,  the patient was discharged home.  DISPOSITION:  Home on November 01, 2010, which is postop day #3.  DISCHARGE MEDICATIONS: 1. Diovan. 2. Rogaine. 3. Mobic, which she will discontinue on Coumadin. 4. Multivitamin. 5. Fish oil. 6. Vitamin D. 7. CoQ10. 8. Niacinamide. 9. Omeprazole. 10.Vicodin, which she will discontinue while she is taking Percocet. 11.Estrace. 12.Byetta. 13.Levemir. 14.Crestor. 15.Metaglip. 16.She is going to be taking hydrocodone. 17.Iron. 18.Docusate  sodium. 19.Coumadin. 20.Crestor.  DISCHARGE INSTRUCTIONS:  Patient will increase her activity slowly.  She should use a walker for assistance.  Diet:  No restrictions.  WOUND CARE:  Daily dressing change.  SPECIAL INSTRUCTIONS:  She will be on Coumadin for 4 weeks from the day of surgery, INR needs to be kept between 2 and 3 to remain therapeutic.  FOLLOWUP:  She will follow with Dr. Darrelyn Hillock in 2 weeks from the day of surgery.  CONDITION ON DISCHARGE:  Improving.     Rozell Searing, PAC   ______________________________ Georges Lynch Darrelyn Hillock, M.D.    LD/MEDQ  D:  11/05/2010  T:  11/05/2010  Job:  [id number]  Electronically Signed by Rozell Searing  on 11/05/2010 05:04:14 PM Electronically Signed by Ranee Gosselin M.D. on 11/15/2010 08:18:43 AM

## 2010-11-15 NOTE — Op Note (Signed)
[name], [name]             ACCOUNT NO.:  [id number]  MEDICAL RECORD NO.:  [id number]  LOCATION:  1601                         FACILITY:  [facility]  PHYSICIAN:  Georges Lynch. Kodey Xue, M.D.DATE OF BIRTH:  March 26, 1941  DATE OF PROCEDURE:  10/29/2010 DATE OF DISCHARGE:                              OPERATIVE REPORT   SURGEON:  Georges Lynch. Darrelyn Hillock, M.D.  ASSISTANT:  Rozell Searing, [facility]  PREOPERATIVE DIAGNOSIS:  Degenerative arthritis, right knee.  POSTOPERATIVE DIAGNOSIS:  Degenerative arthritis, right knee.  OPERATION:  Right total knee arthroplasty utilizing DePuy system.  The sizes used were as follows:  Tibial tray was a size 3 cemented.  We utilized a rotating platform insert size 4 of 12.5-mm thickness.  The femoral component was a size 4 right narrow.  The patella was a size 35. We did use gentamicin in the cement.  All three components were cemented.  DESCRIPTION OF PROCEDURE:  The procedure under general anesthesia, routine orthopedic prep and drape of the right lower extremity was carried out.  She had 1 gram of IV Ancef preop.  The appropriate time- out was carried out before any incision was made.  Also, on the holding area, I marked the appropriate right leg.  At this time, after sterile prep and drape was carried out, the leg was placed in a DeMayo leg holder.  The leg was exsanguinated with Esmarch and tourniquet was elevated to 350 mmHg.  The knee then was flexed and the anterior incision was made.  Bleeders identified and cauterized.  Two flaps were created.  I then carried out a median parapatellar incision reflecting patella laterally and then went down did a synovectomy and did a medial and lateral meniscectomy and excised the anterior and posterior cruciate ligaments.  Following that, initial drill holes were made in the intercondylar notch.  The intramedullary guide rod was inserted, 12 mm thickness was taken off the distal femur.  We then measured the femur  to be a size 4 narrow.  We then cut the anterior-posterior chamfering cuts for a size 4 narrow right femoral component.  After that, we then prepared the tibia by removing 6 mm thickness off of the appropriate side as a guide for removal of the tibia portion of the tibial plateau cut.  We did utilize the intramedullary rod.  Following that, the keel cut was cut for a size 3 tibial tray.  Prior to doing that, we did go through our tension guides and selected a 12.5-mm thickness tibial insert.  After that, we then prepared the remaining part of the tibia. We then cut our keel cut as I mentioned above and then cut our notch cut out of the femur in the usual fashion.  Thoroughly irrigated out the knee, inserted our trial components and felt that the 12.5 mm thickness insert was the best far stability and flexion/extension, after that, did a resurfacing procedure on the patella.  Three drill holes were made in the patella.  Patella size selected with a 35 mm.  We then removed all trial components, thoroughly water picked out of the knee, cemented all three components in simultaneously.  After the cement was dried, we then searched for all  loose pieces of cement and they were appropriately removed.  We then water picked the knee to make sure there are no other loose pieces of cement posteriorly.  I then inserted the rotating platform, reduced the knee and we had excellent stability again and excellent motion.  I injected 10 mL of Surgiflo into the soft tissue structures, closed the knee in layers in the usual fashion with the over Hemovac drain.  Prior to closing the subcu and skin, the tourniquet was let down and we had excellent control of the bleeding.  Remaining part of the wound then was closed.  Skin was closed with metal staples.  Sterile dressing was applied.  No Neosporin was used.          ______________________________ Georges Lynch Darrelyn Hillock, M.D.     RAG/MEDQ  D:  10/29/2010  T:   10/29/2010  Job:  [id number]  cc:   Windy Fast A. Darrelyn Hillock, M.D. Fax: [phone]  Elson Areas Dr. Kevan Ny  Electronically Signed by Ranee Gosselin M.D. on 11/15/2010 08:18:47 AM

## 2010-11-21 ENCOUNTER — Encounter: Payer: Medicare Other | Admitting: Physical Therapy

## 2010-11-21 ENCOUNTER — Ambulatory Visit: Payer: Medicare Other | Attending: Orthopedic Surgery | Admitting: Physical Therapy

## 2010-11-21 DIAGNOSIS — M25669 Stiffness of unspecified knee, not elsewhere classified: Secondary | ICD-10-CM | POA: Insufficient documentation

## 2010-11-21 DIAGNOSIS — R262 Difficulty in walking, not elsewhere classified: Secondary | ICD-10-CM | POA: Insufficient documentation

## 2010-11-21 DIAGNOSIS — M25569 Pain in unspecified knee: Secondary | ICD-10-CM | POA: Insufficient documentation

## 2010-11-21 DIAGNOSIS — IMO0001 Reserved for inherently not codable concepts without codable children: Secondary | ICD-10-CM | POA: Insufficient documentation

## 2010-11-21 DIAGNOSIS — M6281 Muscle weakness (generalized): Secondary | ICD-10-CM | POA: Insufficient documentation

## 2010-11-25 ENCOUNTER — Ambulatory Visit: Payer: Medicare Other | Admitting: Physical Therapy

## 2010-11-28 ENCOUNTER — Encounter: Payer: Medicare Other | Admitting: Physical Therapy

## 2010-12-02 ENCOUNTER — Encounter: Payer: Medicare Other | Admitting: Physical Therapy

## 2010-12-04 ENCOUNTER — Encounter: Payer: Medicare Other | Admitting: Physical Therapy

## 2010-12-09 ENCOUNTER — Ambulatory Visit: Payer: Medicare Other | Admitting: Physical Therapy

## 2010-12-12 ENCOUNTER — Ambulatory Visit: Payer: Medicare Other | Attending: Orthopedic Surgery | Admitting: Physical Therapy

## 2010-12-12 ENCOUNTER — Encounter (HOSPITAL_COMMUNITY): Payer: Self-pay | Admitting: Radiology

## 2010-12-12 ENCOUNTER — Emergency Department (HOSPITAL_COMMUNITY)
Admission: EM | Admit: 2010-12-12 | Discharge: 2010-12-12 | Disposition: A | Discharge status: Home / Self Care | Payer: Medicare Other | Attending: Emergency Medicine | Admitting: Emergency Medicine

## 2010-12-12 ENCOUNTER — Other Ambulatory Visit: Payer: Self-pay | Admitting: Internal Medicine

## 2010-12-12 ENCOUNTER — Emergency Department (HOSPITAL_COMMUNITY): Payer: Medicare Other

## 2010-12-12 DIAGNOSIS — IMO0001 Reserved for inherently not codable concepts without codable children: Secondary | ICD-10-CM | POA: Insufficient documentation

## 2010-12-12 DIAGNOSIS — H409 Unspecified glaucoma: Secondary | ICD-10-CM | POA: Insufficient documentation

## 2010-12-12 DIAGNOSIS — R000 Tachycardia, unspecified: Secondary | ICD-10-CM | POA: Insufficient documentation

## 2010-12-12 DIAGNOSIS — M25669 Stiffness of unspecified knee, not elsewhere classified: Secondary | ICD-10-CM | POA: Insufficient documentation

## 2010-12-12 DIAGNOSIS — R002 Palpitations: Secondary | ICD-10-CM | POA: Insufficient documentation

## 2010-12-12 DIAGNOSIS — R262 Difficulty in walking, not elsewhere classified: Secondary | ICD-10-CM | POA: Insufficient documentation

## 2010-12-12 DIAGNOSIS — R110 Nausea: Secondary | ICD-10-CM | POA: Insufficient documentation

## 2010-12-12 DIAGNOSIS — I10 Essential (primary) hypertension: Secondary | ICD-10-CM | POA: Insufficient documentation

## 2010-12-12 DIAGNOSIS — E119 Type 2 diabetes mellitus without complications: Secondary | ICD-10-CM | POA: Insufficient documentation

## 2010-12-12 DIAGNOSIS — M25569 Pain in unspecified knee: Secondary | ICD-10-CM | POA: Insufficient documentation

## 2010-12-12 DIAGNOSIS — E785 Hyperlipidemia, unspecified: Secondary | ICD-10-CM | POA: Insufficient documentation

## 2010-12-12 DIAGNOSIS — M6281 Muscle weakness (generalized): Secondary | ICD-10-CM | POA: Insufficient documentation

## 2010-12-12 DIAGNOSIS — R5381 Other malaise: Secondary | ICD-10-CM | POA: Insufficient documentation

## 2010-12-12 HISTORY — DX: Essential (primary) hypertension: I10

## 2010-12-12 HISTORY — DX: Acute embolism and thrombosis of unspecified deep veins of unspecified lower extremity: I82.409

## 2010-12-12 HISTORY — DX: Anemia, unspecified: D64.9

## 2010-12-12 LAB — DIFFERENTIAL
Basophils Absolute: 0 10*3/uL (ref 0.0–0.1)
Basophils Relative: 0 % (ref 0–1)
Eosinophils Absolute: 0.1 10*3/uL (ref 0.0–0.7)
Eosinophils Relative: 1 % (ref 0–5)
Lymphocytes Relative: 25 % (ref 12–46)
Lymphs Abs: 3.2 10*3/uL (ref 0.7–4.0)
Monocytes Absolute: 1 10*3/uL (ref 0.1–1.0)
Monocytes Relative: 8 % (ref 3–12)
Neutro Abs: 8.5 10*3/uL — ABNORMAL HIGH (ref 1.7–7.7)
Neutrophils Relative %: 66 % (ref 43–77)

## 2010-12-12 LAB — CBC
HCT: 35.8 % — ABNORMAL LOW (ref 36.0–46.0)
Hemoglobin: 11.7 g/dL — ABNORMAL LOW (ref 12.0–15.0)
MCH: 24.4 pg — ABNORMAL LOW (ref 26.0–34.0)
MCHC: 32.7 g/dL (ref 30.0–36.0)
MCV: 74.6 fL — ABNORMAL LOW (ref 78.0–100.0)
Platelets: 476 10*3/uL — ABNORMAL HIGH (ref 150–400)
RBC: 4.8 MIL/uL (ref 3.87–5.11)
RDW: 14.9 % (ref 11.5–15.5)
WBC: 12.8 10*3/uL — ABNORMAL HIGH (ref 4.0–10.5)

## 2010-12-12 LAB — BASIC METABOLIC PANEL
BUN: 22 mg/dL (ref 6–23)
CO2: 23 mEq/L (ref 19–32)
Calcium: 10.3 mg/dL (ref 8.4–10.5)
Chloride: 101 mEq/L (ref 96–112)
Creatinine, Ser: 1.02 mg/dL (ref 0.50–1.10)
GFR calc Af Amer: >60 mL/min (ref >60)
GFR calc non Af Amer: 54 mL/min — ABNORMAL LOW (ref >60)
Glucose, Bld: 152 mg/dL — ABNORMAL HIGH (ref 70–99)
Potassium: 4.2 mEq/L (ref 3.5–5.1)
Sodium: 137 mEq/L (ref 135–145)

## 2010-12-12 MED ORDER — IOHEXOL 300 MG/ML  SOLN
100.0000 mL | Freq: Once | INTRAMUSCULAR | Status: AC | PRN
  Administered 2010-12-12: 100 mL via INTRAVENOUS

## 2010-12-16 ENCOUNTER — Ambulatory Visit: Payer: Medicare Other | Admitting: Physical Therapy

## 2010-12-18 ENCOUNTER — Encounter: Payer: Medicare Other | Admitting: Physical Therapy

## 2010-12-23 ENCOUNTER — Ambulatory Visit: Payer: Medicare Other | Admitting: Physical Therapy

## 2010-12-25 ENCOUNTER — Ambulatory Visit: Payer: Medicare Other | Admitting: Physical Therapy

## 2010-12-30 ENCOUNTER — Ambulatory Visit: Payer: Medicare Other | Admitting: Physical Therapy

## 2011-01-01 ENCOUNTER — Encounter: Payer: Medicare Other | Admitting: Physical Therapy

## 2011-01-06 ENCOUNTER — Ambulatory Visit: Payer: Medicare Other | Admitting: Physical Therapy

## 2011-01-08 ENCOUNTER — Ambulatory Visit: Payer: Medicare Other | Admitting: Physical Therapy

## 2011-01-13 ENCOUNTER — Ambulatory Visit: Payer: Medicare Other | Attending: Orthopedic Surgery | Admitting: Physical Therapy

## 2011-01-13 DIAGNOSIS — IMO0001 Reserved for inherently not codable concepts without codable children: Secondary | ICD-10-CM | POA: Insufficient documentation

## 2011-01-13 DIAGNOSIS — R262 Difficulty in walking, not elsewhere classified: Secondary | ICD-10-CM | POA: Insufficient documentation

## 2011-01-13 DIAGNOSIS — M6281 Muscle weakness (generalized): Secondary | ICD-10-CM | POA: Insufficient documentation

## 2011-01-13 DIAGNOSIS — M25569 Pain in unspecified knee: Secondary | ICD-10-CM | POA: Insufficient documentation

## 2011-01-13 DIAGNOSIS — M25669 Stiffness of unspecified knee, not elsewhere classified: Secondary | ICD-10-CM | POA: Insufficient documentation

## 2011-01-15 ENCOUNTER — Encounter: Payer: Medicare Other | Admitting: Physical Therapy

## 2011-02-03 ENCOUNTER — Encounter: Payer: Medicare Other | Admitting: Physical Therapy

## 2011-02-05 ENCOUNTER — Encounter: Payer: Medicare Other | Admitting: Physical Therapy

## 2011-02-06 LAB — TYPE AND SCREEN
ABO/RH(D): B POS
Antibody Screen: NEGATIVE

## 2011-02-06 LAB — COMPREHENSIVE METABOLIC PANEL
ALT: 27
AST: 37
Albumin: 3.9
Alkaline Phosphatase: 57
BUN: 23
CO2: 25
Calcium: 9.7
Chloride: 107
Creatinine, Ser: 1.06
GFR calc Af Amer: >60
GFR calc non Af Amer: 52 — ABNORMAL LOW
Glucose, Bld: 139 — ABNORMAL HIGH
Potassium: 4.6
Sodium: 140
Total Bilirubin: 0.5
Total Protein: 6.8

## 2011-02-06 LAB — DIFFERENTIAL
Basophils Absolute: 0.1
Basophils Relative: 1
Eosinophils Absolute: 0.3
Eosinophils Relative: 3
Lymphocytes Relative: 37
Lymphs Abs: 3.1
Monocytes Absolute: 0.7
Monocytes Relative: 9
Neutro Abs: 4.2
Neutrophils Relative %: 50

## 2011-02-06 LAB — URINALYSIS, ROUTINE W REFLEX MICROSCOPIC
Bilirubin Urine: NEGATIVE
Glucose, UA: NEGATIVE
Hgb urine dipstick: NEGATIVE
Ketones, ur: NEGATIVE
Nitrite: NEGATIVE
Protein, ur: NEGATIVE
Specific Gravity, Urine: 1.034 — ABNORMAL HIGH
Urobilinogen, UA: 0.2
pH: 5.5

## 2011-02-06 LAB — PROTIME-INR
INR: 0.8
Prothrombin Time: 11.5 — ABNORMAL LOW

## 2011-02-06 LAB — CBC
HCT: 40
Hemoglobin: 13.6
MCHC: 33.9
MCV: 80.9
Platelets: 261
RBC: 4.94
RDW: 12.9
WBC: 8.4

## 2011-02-06 LAB — GLUCOSE, CAPILLARY
Glucose-Capillary: 147 — ABNORMAL HIGH
Glucose-Capillary: 151 — ABNORMAL HIGH
Glucose-Capillary: 160 — ABNORMAL HIGH
Glucose-Capillary: 190 — ABNORMAL HIGH
Glucose-Capillary: 216 — ABNORMAL HIGH
Glucose-Capillary: 230 — ABNORMAL HIGH

## 2011-02-06 LAB — APTT: aPTT: 26

## 2011-02-06 LAB — ABO/RH: ABO/RH(D): B POS

## 2011-06-30 ENCOUNTER — Ambulatory Visit: Payer: Medicare Other | Attending: Orthopedic Surgery | Admitting: Physical Therapy

## 2011-06-30 DIAGNOSIS — M2560 Stiffness of unspecified joint, not elsewhere classified: Secondary | ICD-10-CM | POA: Insufficient documentation

## 2011-06-30 DIAGNOSIS — M6281 Muscle weakness (generalized): Secondary | ICD-10-CM | POA: Insufficient documentation

## 2011-06-30 DIAGNOSIS — R293 Abnormal posture: Secondary | ICD-10-CM | POA: Insufficient documentation

## 2011-06-30 DIAGNOSIS — IMO0001 Reserved for inherently not codable concepts without codable children: Secondary | ICD-10-CM | POA: Insufficient documentation

## 2011-06-30 DIAGNOSIS — M2550 Pain in unspecified joint: Secondary | ICD-10-CM | POA: Insufficient documentation

## 2011-07-02 ENCOUNTER — Ambulatory Visit: Payer: Medicare Other | Admitting: Physical Therapy

## 2011-07-07 ENCOUNTER — Ambulatory Visit: Payer: Medicare Other | Admitting: Physical Therapy

## 2011-07-09 ENCOUNTER — Encounter: Payer: Medicare Other | Admitting: Physical Therapy

## 2011-07-10 ENCOUNTER — Ambulatory Visit: Payer: Medicare Other | Attending: Orthopedic Surgery | Admitting: Physical Therapy

## 2011-07-10 DIAGNOSIS — R293 Abnormal posture: Secondary | ICD-10-CM | POA: Insufficient documentation

## 2011-07-10 DIAGNOSIS — M6281 Muscle weakness (generalized): Secondary | ICD-10-CM | POA: Insufficient documentation

## 2011-07-10 DIAGNOSIS — M2560 Stiffness of unspecified joint, not elsewhere classified: Secondary | ICD-10-CM | POA: Insufficient documentation

## 2011-07-10 DIAGNOSIS — IMO0001 Reserved for inherently not codable concepts without codable children: Secondary | ICD-10-CM | POA: Insufficient documentation

## 2011-07-10 DIAGNOSIS — M2550 Pain in unspecified joint: Secondary | ICD-10-CM | POA: Insufficient documentation

## 2011-07-14 ENCOUNTER — Ambulatory Visit: Payer: Medicare Other | Admitting: Physical Therapy

## 2011-07-17 ENCOUNTER — Ambulatory Visit: Payer: Medicare Other | Admitting: Physical Therapy

## 2011-07-21 ENCOUNTER — Ambulatory Visit: Payer: Medicare Other | Admitting: Physical Therapy

## 2011-07-23 ENCOUNTER — Encounter: Payer: Medicare Other | Admitting: Physical Therapy

## 2011-07-24 ENCOUNTER — Ambulatory Visit: Payer: Medicare Other | Admitting: Physical Therapy

## 2011-07-28 ENCOUNTER — Ambulatory Visit: Payer: Medicare Other | Admitting: Physical Therapy

## 2011-07-30 ENCOUNTER — Ambulatory Visit: Payer: Medicare Other | Admitting: Physical Therapy

## 2011-08-04 ENCOUNTER — Ambulatory Visit: Payer: Medicare Other | Admitting: Physical Therapy

## 2011-08-06 ENCOUNTER — Encounter: Payer: Medicare Other | Admitting: Physical Therapy

## 2011-08-19 ENCOUNTER — Ambulatory Visit: Payer: Medicare Other | Attending: Orthopedic Surgery | Admitting: Physical Therapy

## 2011-08-19 DIAGNOSIS — R293 Abnormal posture: Secondary | ICD-10-CM | POA: Insufficient documentation

## 2011-08-19 DIAGNOSIS — M2550 Pain in unspecified joint: Secondary | ICD-10-CM | POA: Insufficient documentation

## 2011-08-19 DIAGNOSIS — M6281 Muscle weakness (generalized): Secondary | ICD-10-CM | POA: Insufficient documentation

## 2011-08-19 DIAGNOSIS — IMO0001 Reserved for inherently not codable concepts without codable children: Secondary | ICD-10-CM | POA: Insufficient documentation

## 2011-08-19 DIAGNOSIS — M2560 Stiffness of unspecified joint, not elsewhere classified: Secondary | ICD-10-CM | POA: Insufficient documentation

## 2012-05-11 HISTORY — PX: CARPAL TUNNEL RELEASE: SHX101

## 2012-08-30 ENCOUNTER — Other Ambulatory Visit: Payer: Self-pay | Admitting: Gastroenterology

## 2012-09-27 NOTE — H&P (Signed)
  The patient is a 72 year old female who presents today for follow up of their back. The patient is being followed for their central (left side worse) back pain. The patient presents today following MRI (thoracic spine) and SCS Trial.  Subjective Transcription She presents today for preoperative evaluation for her spinal cord stimulator. The patient has been under my care for several years now. She still continues to have severe debilitating back pain. Ultimately, having a failed back syndrome this patient underwent a spinal cord stimulator trial in April. The patient ultimately had according to Dr. Ethelene Hal and the patient significant improvement in her overall pain. The device has been out and she would like to proceed with permanent implantation.   Allergies HCTZ. 02/13/1999   Social History) Tobacco / smoke exposure. no   Medication History) Norco (5-325MG  Tablet, 1 Oral every six hours, as needed, Taken starting 05/25/2011) Active. MetFORMIN HCl ( Oral) Specific dose unknown - Active. GlipiZIDE XL ( Oral) Specific dose unknown - Active. Levemir ( Subcutaneous) Specific dose unknown - Active. Byetta 10 MCG Pen ( Subcutaneous) Specific dose unknown - Active. Estradiol Acetate ( Oral) Specific dose unknown - Active. Crestor ( Oral) Specific dose unknown - Active. Metoprolol Succinate ( Oral) Specific dose unknown - Active. Restoril ( Oral) Specific dose unknown - Active. Magnesium Oxide ( Oral) Specific dose unknown - Active. Omeprazole ( Oral) Specific dose unknown - Active.   Objective Transcription  She is a pleasant woman who appears younger than her stated age. She is alert and oriented x3. No shortness of breath or chest pain. The abdomen is soft and nontender. She has no loss of bowel and bladder control. She has difficulty and pain when rising from a seated position. She has pain when she walks long distances but she is grossly neurologically intact. No foot  drop. Intact peripheral pulses. Compartments are soft and nontender.    RADIOGRAPHS:  Her thoracic MRI demonstrates a T7-T8 moderate to large right central and posterior lateral disc extrusion which contacts the cord. At C6-C7 there is a small right central disc protrusion. At T11-T12 there is a small cord disc protrusion with no cord deformity. At L1-L2 there is moderate to large central and paracentral disc protrusion with epidural mass effect on the left side. At C5-C6 there is a small to moderate disc protrusion.    Assessments Transcription  At this point in time, I have reviewed the risks with [facility] which include infection, bleeding, nerve damage, death, stroke, paralysis, bleeding, need for further surgery, ongoing or worse pain, migration of the device, inability to place the device, battery failure, need for revision. I will discuss with the [employer] rep the location of implantation. My only concern is that left disc extrusion at T7-T8. While there is not significant central stenosis it may affect my ability to place the device midline if it needs to be above the T7-T8 disc space. The patient has requested late June for her surgery which I think is reasonable and fine. We will go ahead and get preoperative medical clearance from her primary care physician and we will set this up for June 25th.

## 2012-09-29 ENCOUNTER — Encounter (HOSPITAL_COMMUNITY): Payer: Self-pay | Admitting: *Deleted

## 2012-09-30 NOTE — Progress Notes (Signed)
ekg sinus tach 119 rate 12-12-2010 dr Anne Fu on chart lov note 01-20-2012 dr Anne Fu cardiology on chart Echo dr Anne Fu 12-18-2010 on chart Stress test 03-03-2011 dr Anne Fu on chart

## 2012-10-04 ENCOUNTER — Encounter (HOSPITAL_COMMUNITY): Payer: Self-pay | Admitting: Pharmacy Technician

## 2012-10-24 ENCOUNTER — Encounter (HOSPITAL_COMMUNITY): Payer: Self-pay | Admitting: Pharmacist

## 2012-10-25 ENCOUNTER — Encounter (HOSPITAL_COMMUNITY): Payer: Self-pay | Admitting: *Deleted

## 2012-10-25 ENCOUNTER — Encounter (HOSPITAL_COMMUNITY): Payer: Self-pay | Admitting: Anesthesiology

## 2012-10-25 ENCOUNTER — Ambulatory Visit (HOSPITAL_COMMUNITY): Payer: Medicare Other | Admitting: Anesthesiology

## 2012-10-25 ENCOUNTER — Ambulatory Visit (HOSPITAL_COMMUNITY)
Admission: RE | Admit: 2012-10-25 | Discharge: 2012-10-25 | Disposition: A | Discharge status: Home / Self Care | Payer: Medicare Other | Source: Ambulatory Visit | Attending: Gastroenterology | Admitting: Gastroenterology

## 2012-10-25 ENCOUNTER — Encounter (HOSPITAL_COMMUNITY)
Admission: RE | Disposition: A | Discharge status: Home / Self Care | Payer: Self-pay | Source: Ambulatory Visit | Attending: Gastroenterology

## 2012-10-25 DIAGNOSIS — Z1211 Encounter for screening for malignant neoplasm of colon: Secondary | ICD-10-CM | POA: Insufficient documentation

## 2012-10-25 DIAGNOSIS — I10 Essential (primary) hypertension: Secondary | ICD-10-CM | POA: Insufficient documentation

## 2012-10-25 DIAGNOSIS — Z90710 Acquired absence of both cervix and uterus: Secondary | ICD-10-CM | POA: Insufficient documentation

## 2012-10-25 DIAGNOSIS — K5730 Diverticulosis of large intestine without perforation or abscess without bleeding: Secondary | ICD-10-CM | POA: Insufficient documentation

## 2012-10-25 DIAGNOSIS — E7800 Pure hypercholesterolemia, unspecified: Secondary | ICD-10-CM | POA: Insufficient documentation

## 2012-10-25 DIAGNOSIS — Z8711 Personal history of peptic ulcer disease: Secondary | ICD-10-CM | POA: Insufficient documentation

## 2012-10-25 DIAGNOSIS — Z79899 Other long term (current) drug therapy: Secondary | ICD-10-CM | POA: Insufficient documentation

## 2012-10-25 DIAGNOSIS — E119 Type 2 diabetes mellitus without complications: Secondary | ICD-10-CM | POA: Insufficient documentation

## 2012-10-25 HISTORY — DX: Tachycardia, unspecified: R00.0

## 2012-10-25 HISTORY — DX: Gastro-esophageal reflux disease without esophagitis: K21.9

## 2012-10-25 HISTORY — PX: COLONOSCOPY WITH PROPOFOL: SHX5780

## 2012-10-25 LAB — GLUCOSE, CAPILLARY: Glucose-Capillary: 139 mg/dL — ABNORMAL HIGH (ref 70–99)

## 2012-10-25 SURGERY — COLONOSCOPY WITH PROPOFOL
Anesthesia: Monitor Anesthesia Care

## 2012-10-25 MED ORDER — PROPOFOL INFUSION 10 MG/ML OPTIME
INTRAVENOUS | Status: DC | PRN
  Administered 2012-10-25: 120 ug/kg/min via INTRAVENOUS

## 2012-10-25 MED ORDER — PROPOFOL 10 MG/ML IV BOLUS
INTRAVENOUS | Status: DC | PRN
  Administered 2012-10-25: 30 mg via INTRAVENOUS
  Administered 2012-10-25: 20 mg via INTRAVENOUS

## 2012-10-25 MED ORDER — KETAMINE HCL 10 MG/ML IJ SOLN
INTRAMUSCULAR | Status: DC | PRN
  Administered 2012-10-25: 20 mg via INTRAVENOUS

## 2012-10-25 MED ORDER — LACTATED RINGERS IV SOLN
INTRAVENOUS | Status: DC | PRN
  Administered 2012-10-25: 13:00:00 via INTRAVENOUS

## 2012-10-25 SURGICAL SUPPLY — 21 items

## 2012-10-25 NOTE — Transfer of Care (Signed)
Immediate Anesthesia Transfer of Care Note  Patient: [name]  Procedure(s) Performed: Procedure(s): COLONOSCOPY WITH PROPOFOL (N/A)  Patient Location: PACU  Anesthesia Type:MAC  Level of Consciousness: awake, alert  and oriented  Airway & Oxygen Therapy: Patient Spontanous Breathing and Patient connected to face mask oxygen  Post-op Assessment: Report given to PACU RN and Post -op Vital signs reviewed and stable  Post vital signs: Reviewed and stable  Complications: No apparent anesthesia complications

## 2012-10-25 NOTE — Anesthesia Preprocedure Evaluation (Addendum)
Anesthesia Evaluation  Patient identified by MRN, date of birth, ID band Patient awake    Reviewed: Allergy & Precautions, H&P , NPO status , Patient's Chart, lab work & pertinent test results  Airway Mallampati: II TM Distance: >3 FB Neck ROM: Full    Dental  (+) Teeth Intact and Dental Advisory Given   Pulmonary neg pulmonary ROS,  breath sounds clear to auscultation        Cardiovascular hypertension, Pt. on medications Rhythm:Regular Rate:Normal     Neuro/Psych negative neurological ROS  negative psych ROS   GI/Hepatic Neg liver ROS, GERD-  Medicated,  Endo/Other  negative endocrine ROSdiabetes, Type 2, Oral Hypoglycemic Agents and Insulin DependentMorbid obesity  Renal/GU negative Renal ROS     Musculoskeletal negative musculoskeletal ROS (+)   Abdominal (+) + obese,   Peds  Hematology  (+) Blood dyscrasia, anemia ,   Anesthesia Other Findings   Reproductive/Obstetrics negative OB ROS                           Anesthesia Physical Anesthesia Plan  ASA: III  Anesthesia Plan: MAC   Post-op Pain Management:    Induction: Intravenous  Airway Management Planned: Simple Face Mask and Nasal Cannula  Additional Equipment:   Intra-op Plan:   Post-operative Plan:   Informed Consent: I have reviewed the patients History and Physical, chart, labs and discussed the procedure including the risks, benefits and alternatives for the proposed anesthesia with the patient or authorized representative who has indicated his/her understanding and acceptance.   Dental advisory given  Plan Discussed with: CRNA  Anesthesia Plan Comments:         Anesthesia Quick Evaluation

## 2012-10-25 NOTE — Anesthesia Postprocedure Evaluation (Signed)
Anesthesia Post Note  Patient: [name]  Procedure(s) Performed: Procedure(s) (LRB): COLONOSCOPY WITH PROPOFOL (N/A)  Anesthesia type: MAC  Patient location: PACU  Post pain: Pain level controlled  Post assessment: Post-op Vital signs reviewed  Last Vitals: BP 141/90  Resp 13  SpO2 99%  Post vital signs: Reviewed  Level of consciousness: awake  Complications: No apparent anesthesia complications

## 2012-10-25 NOTE — Op Note (Signed)
Procedure: Screening colonoscopy  Endoscopist: Martin Johnson  Premedication: Propofol administered by anesthesia  Procedure: The patient was placed in the left lateral decubitus position. Anal inspection and digital rectal exam were normal. The Pentax pediatric colonoscope was introduced into the rectum and advanced to the cecum. A normal-appearing ileocecal valve and appendiceal orifice were identified. Colonic preparation for the exam today was good.  Rectum. Normal. Retroflex view of the distal rectum normal.  Sigmoid colon and descending colon. Left colonic diverticulosis  Splenic flexure. Normal.  Transverse colon. Normal.  Hepatic flexure. Normal.  Ascending colon. Normal.  Cecum and ileocecal valve. Normal.  Assessment: Normal screening proctocolonoscopy to the cecum.   

## 2012-10-25 NOTE — Preoperative (Signed)
Beta Blockers   Reason not to administer Beta Blockers:Not Applicable 

## 2012-10-25 NOTE — H&P (Signed)
  Procedure: Screening colonoscopy  History: The patient is a 72 year old female born [birth year]. She underwent a normal screening colonoscopy on 04/17/2002. The patient is scheduled to undergo a repeat screening colonoscopy today.  Past medical and surgical history: Hypertension. Type 2 diabetes mellitus. Vitamin D. deficiency. Arthralgias. Hypercholesterolemia. Allergic rhinitis. Lumbar spinal stenosis. Gastric ulcers secondary to nonsteroidal anti-inflammatory medication. Total abdominal hysterectomy. Bilateral salpingo-oophorectomy. Appendectomy. Lumbar surgery. Total right knee replacement surgery. Carpal tunnel surgery.  Allergies: Vioxx. Cymbalta. Hydrochlorothiazide. Indocin. Lyrica. Neosporin. Vytorin. Zocor. Lisinopril.  Exam: The patient is alert and lying comfortably on the endoscopy stretcher. Abdomen is soft, flat, and nontender to palpation. Cardiac exam reveals a regular rhythm. Lungs are clear to auscultation.  Plan: Proceed with repeat screening colonoscopy using propofol sedation.

## 2012-10-26 ENCOUNTER — Encounter (HOSPITAL_COMMUNITY): Payer: Self-pay | Admitting: Gastroenterology

## 2012-10-27 ENCOUNTER — Encounter (HOSPITAL_COMMUNITY): Payer: Self-pay

## 2012-10-27 ENCOUNTER — Encounter (HOSPITAL_COMMUNITY)
Admission: RE | Admit: 2012-10-27 | Discharge: 2012-10-27 | Disposition: A | Discharge status: Home / Self Care | Payer: Medicare Other | Source: Ambulatory Visit | Attending: Orthopedic Surgery | Admitting: Orthopedic Surgery

## 2012-10-27 DIAGNOSIS — Z01818 Encounter for other preprocedural examination: Secondary | ICD-10-CM | POA: Insufficient documentation

## 2012-10-27 DIAGNOSIS — Z01812 Encounter for preprocedural laboratory examination: Secondary | ICD-10-CM | POA: Insufficient documentation

## 2012-10-27 HISTORY — DX: Unspecified osteoarthritis, unspecified site: M19.90

## 2012-10-27 LAB — CBC
HCT: 37.8 % (ref 36.0–46.0)
Hemoglobin: 12.8 g/dL (ref 12.0–15.0)
MCH: 27 pg (ref 26.0–34.0)
MCHC: 33.9 g/dL (ref 30.0–36.0)
MCV: 79.7 fL (ref 78.0–100.0)
Platelets: 265 10*3/uL (ref 150–400)
RBC: 4.74 MIL/uL (ref 3.87–5.11)
RDW: 14.5 % (ref 11.5–15.5)
WBC: 7.3 10*3/uL (ref 4.0–10.5)

## 2012-10-27 LAB — BASIC METABOLIC PANEL
BUN: 16 mg/dL (ref 6–23)
CO2: 25 mEq/L (ref 19–32)
Calcium: 9.6 mg/dL (ref 8.4–10.5)
Chloride: 101 mEq/L (ref 96–112)
Creatinine, Ser: 0.96 mg/dL (ref 0.50–1.10)
GFR calc Af Amer: 67 mL/min — ABNORMAL LOW (ref >90)
GFR calc non Af Amer: 58 mL/min — ABNORMAL LOW (ref >90)
Glucose, Bld: 170 mg/dL — ABNORMAL HIGH (ref 70–99)
Potassium: 4.4 mEq/L (ref 3.5–5.1)
Sodium: 135 mEq/L (ref 135–145)

## 2012-10-27 LAB — SURGICAL PCR SCREEN
MRSA, PCR: NEGATIVE
Staphylococcus aureus: NEGATIVE

## 2012-10-27 MED ORDER — SODIUM CHLORIDE 0.9 % IV SOLN: INTRAVENOUS | Status: DC

## 2012-10-27 NOTE — Progress Notes (Signed)
Dr Anne Fu called for stress test,echo

## 2012-10-27 NOTE — Pre-Procedure Instructions (Signed)
[name]  10/27/2012   Your procedure is scheduled on:  Thursday, June 26th  Report to [facility] at 0530 AM. Come to main entrance "A" and go to east elevators up to 3rd floor. Check in at short stay desk.  Call this number if you have problems the morning of surgery: [phone]   Remember:   Do not eat food or drink liquids after midnight.   Take these medicines the morning of surgery with A SIP OF WATER: Toprol, Prilosec   Do not wear jewelry, make-up or nail polish.  Do not wear lotions, powders, or perfume, deodorant.  Do not shave 48 hours prior to surgery. Men may shave face and neck.  Do not bring valuables to the hospital.  [facility] is not responsible  for any belongings or valuables.  Contacts, dentures or bridgework may not be worn into surgery.  Leave suitcase in the car. After surgery it may be brought to your room.  For patients admitted to the hospital, checkout time is 11:00 AM the day of discharge.   Patients discharged the day of surgery will not be allowed to drive home.  Special Instructions: Shower using CHG 2 nights before surgery and the night before surgery.  If you shower the day of surgery use CHG.  Use special wash - you have one bottle of CHG for all showers.  You should use approximately 1/3 of the bottle for each shower.   Please read over the following fact sheets that you were given: Pain Booklet, Coughing and Deep Breathing, MRSA Information and Surgical Site Infection Prevention

## 2012-11-02 MED ORDER — DEXAMETHASONE SODIUM PHOSPHATE 4 MG/ML IJ SOLN
4.0000 mg | Freq: Once | INTRAMUSCULAR | Status: AC
  Administered 2012-11-03: 4 mg via INTRAVENOUS
  Filled 2012-11-02: qty 1

## 2012-11-02 MED ORDER — CEFAZOLIN SODIUM-DEXTROSE 2-3 GM-% IV SOLR
2.0000 g | INTRAVENOUS | Status: AC
  Administered 2012-11-03: 2 g via INTRAVENOUS
  Filled 2012-11-02: qty 50

## 2012-11-02 MED ORDER — ACETAMINOPHEN 10 MG/ML IV SOLN
1000.0000 mg | Freq: Once | INTRAVENOUS | Status: AC
  Administered 2012-11-03: 1000 mg via INTRAVENOUS
  Filled 2012-11-02: qty 100

## 2012-11-03 ENCOUNTER — Ambulatory Visit (HOSPITAL_COMMUNITY): Payer: Medicare Other | Admitting: Certified Registered"

## 2012-11-03 ENCOUNTER — Encounter (HOSPITAL_COMMUNITY): Payer: Self-pay | Admitting: Certified Registered"

## 2012-11-03 ENCOUNTER — Ambulatory Visit (HOSPITAL_COMMUNITY): Payer: Medicare Other

## 2012-11-03 ENCOUNTER — Encounter (HOSPITAL_COMMUNITY)
Admission: RE | Disposition: A | Discharge status: Home / Self Care | Payer: Self-pay | Source: Ambulatory Visit | Attending: Orthopedic Surgery

## 2012-11-03 ENCOUNTER — Encounter (HOSPITAL_COMMUNITY): Payer: Self-pay | Admitting: *Deleted

## 2012-11-03 ENCOUNTER — Observation Stay (HOSPITAL_COMMUNITY)
Admission: RE | Admit: 2012-11-03 | Discharge: 2012-11-04 | Disposition: A | Discharge status: Home / Self Care | Payer: Medicare Other | Source: Ambulatory Visit | Attending: Orthopedic Surgery | Admitting: Orthopedic Surgery

## 2012-11-03 DIAGNOSIS — Z79899 Other long term (current) drug therapy: Secondary | ICD-10-CM | POA: Insufficient documentation

## 2012-11-03 DIAGNOSIS — I10 Essential (primary) hypertension: Secondary | ICD-10-CM | POA: Insufficient documentation

## 2012-11-03 DIAGNOSIS — E119 Type 2 diabetes mellitus without complications: Secondary | ICD-10-CM | POA: Insufficient documentation

## 2012-11-03 DIAGNOSIS — Z794 Long term (current) use of insulin: Secondary | ICD-10-CM | POA: Insufficient documentation

## 2012-11-03 DIAGNOSIS — G8929 Other chronic pain: Principal | ICD-10-CM | POA: Insufficient documentation

## 2012-11-03 HISTORY — PX: SPINAL CORD STIMULATOR INSERTION: SHX5378

## 2012-11-03 LAB — GLUCOSE, CAPILLARY
Glucose-Capillary: 161 mg/dL — ABNORMAL HIGH (ref 70–99)
Glucose-Capillary: 168 mg/dL — ABNORMAL HIGH (ref 70–99)
Glucose-Capillary: 209 mg/dL — ABNORMAL HIGH (ref 70–99)

## 2012-11-03 SURGERY — INSERTION, SPINAL CORD STIMULATOR, LUMBAR
Anesthesia: General | Site: Back | Wound class: Clean

## 2012-11-03 MED ORDER — OXYCODONE HCL 5 MG PO TABS
5.0000 mg | ORAL_TABLET | Freq: Once | ORAL | Status: AC | PRN
  Administered 2012-11-03: 5 mg via ORAL

## 2012-11-03 MED ORDER — PROMETHAZINE HCL 25 MG/ML IJ SOLN: 6.2500 mg | INTRAMUSCULAR | Status: DC | PRN

## 2012-11-03 MED ORDER — ZOLPIDEM TARTRATE 5 MG PO TABS
5.0000 mg | ORAL_TABLET | Freq: Every evening | ORAL | Status: DC | PRN
  Administered 2012-11-03: 5 mg via ORAL
  Filled 2012-11-03: qty 1

## 2012-11-03 MED ORDER — ONDANSETRON HCL 4 MG/2ML IJ SOLN
4.0000 mg | INTRAMUSCULAR | Status: DC | PRN
  Administered 2012-11-03: 4 mg via INTRAVENOUS
  Filled 2012-11-03: qty 2

## 2012-11-03 MED ORDER — METOPROLOL SUCCINATE ER 100 MG PO TB24
100.0000 mg | ORAL_TABLET | Freq: Two times a day (BID) | ORAL | Status: DC
  Administered 2012-11-03: 100 mg via ORAL
  Filled 2012-11-03 (×3): qty 1

## 2012-11-03 MED ORDER — HYDROMORPHONE HCL PF 1 MG/ML IJ SOLN
0.2500 mg | INTRAMUSCULAR | Status: DC | PRN
  Administered 2012-11-03 (×6): 0.5 mg via INTRAVENOUS

## 2012-11-03 MED ORDER — METFORMIN HCL 500 MG PO TABS
1000.0000 mg | ORAL_TABLET | Freq: Two times a day (BID) | ORAL | Status: DC
  Administered 2012-11-03 – 2012-11-04 (×2): 1000 mg via ORAL
  Filled 2012-11-03 (×4): qty 2

## 2012-11-03 MED ORDER — MIDAZOLAM HCL 2 MG/2ML IJ SOLN
INTRAMUSCULAR | Status: AC
  Filled 2012-11-03: qty 2

## 2012-11-03 MED ORDER — INSULIN DETEMIR 100 UNIT/ML FLEXPEN
25.0000 [IU] | Freq: Every day | SUBCUTANEOUS | Status: DC
  Administered 2012-11-03 – 2012-11-04 (×2): 25 [IU] via SUBCUTANEOUS
  Filled 2012-11-03: qty 3

## 2012-11-03 MED ORDER — GLYCOPYRROLATE 0.2 MG/ML IJ SOLN
INTRAMUSCULAR | Status: DC | PRN
  Administered 2012-11-03 (×2): 0.2 mg via INTRAVENOUS

## 2012-11-03 MED ORDER — HYDROMORPHONE HCL PF 1 MG/ML IJ SOLN
INTRAMUSCULAR | Status: AC
  Filled 2012-11-03: qty 1

## 2012-11-03 MED ORDER — ACETAMINOPHEN 10 MG/ML IV SOLN
1000.0000 mg | Freq: Four times a day (QID) | INTRAVENOUS | Status: DC
  Administered 2012-11-03 – 2012-11-04 (×3): 1000 mg via INTRAVENOUS
  Filled 2012-11-03 (×4): qty 100

## 2012-11-03 MED ORDER — DILTIAZEM HCL ER 120 MG PO CP24
120.0000 mg | ORAL_CAPSULE | Freq: Every day | ORAL | Status: DC
  Administered 2012-11-03: 120 mg via ORAL
  Filled 2012-11-03 (×2): qty 1

## 2012-11-03 MED ORDER — OXYCODONE HCL 5 MG PO TABS
ORAL_TABLET | ORAL | Status: AC
  Filled 2012-11-03: qty 1

## 2012-11-03 MED ORDER — CEFAZOLIN SODIUM 1-5 GM-% IV SOLN
1.0000 g | Freq: Three times a day (TID) | INTRAVENOUS | Status: AC
  Administered 2012-11-03 (×2): 1 g via INTRAVENOUS
  Filled 2012-11-03 (×2): qty 50

## 2012-11-03 MED ORDER — THROMBIN 20000 UNITS EX SOLR
CUTANEOUS | Status: AC
  Filled 2012-11-03: qty 20000

## 2012-11-03 MED ORDER — GLIPIZIDE 10 MG PO TABS
10.0000 mg | ORAL_TABLET | Freq: Two times a day (BID) | ORAL | Status: DC
  Administered 2012-11-03 – 2012-11-04 (×2): 10 mg via ORAL
  Filled 2012-11-03 (×4): qty 1

## 2012-11-03 MED ORDER — MORPHINE SULFATE 2 MG/ML IJ SOLN
1.0000 mg | INTRAMUSCULAR | Status: DC | PRN
  Administered 2012-11-03: 4 mg via INTRAVENOUS
  Administered 2012-11-03: 2 mg via INTRAVENOUS
  Filled 2012-11-03: qty 1
  Filled 2012-11-03: qty 2

## 2012-11-03 MED ORDER — CEFAZOLIN SODIUM 1-5 GM-% IV SOLN: 1.0000 g | Freq: Three times a day (TID) | INTRAVENOUS | Status: DC

## 2012-11-03 MED ORDER — GLIPIZIDE-METFORMIN HCL 5-500 MG PO TABS: 2.0000 | ORAL_TABLET | Freq: Two times a day (BID) | ORAL | Status: DC

## 2012-11-03 MED ORDER — METHOCARBAMOL 500 MG PO TABS
500.0000 mg | ORAL_TABLET | Freq: Four times a day (QID) | ORAL | Status: DC | PRN
  Administered 2012-11-04: 500 mg via ORAL
  Filled 2012-11-03: qty 1

## 2012-11-03 MED ORDER — SODIUM CHLORIDE 0.9 % IV SOLN
250.0000 mL | INTRAVENOUS | Status: DC
  Administered 2012-11-03: 250 mL via INTRAVENOUS

## 2012-11-03 MED ORDER — THROMBIN 20000 UNITS EX SOLR
OROMUCOSAL | Status: DC | PRN
  Administered 2012-11-03: 08:00:00 via TOPICAL

## 2012-11-03 MED ORDER — 0.9 % SODIUM CHLORIDE (POUR BTL) OPTIME
TOPICAL | Status: DC | PRN
  Administered 2012-11-03: 1000 mL

## 2012-11-03 MED ORDER — BUPIVACAINE-EPINEPHRINE 0.25% -1:200000 IJ SOLN
INTRAMUSCULAR | Status: DC | PRN
  Administered 2012-11-03: 10 mL

## 2012-11-03 MED ORDER — SODIUM CHLORIDE 0.9 % IJ SOLN
3.0000 mL | Freq: Two times a day (BID) | INTRAMUSCULAR | Status: DC
  Administered 2012-11-03: 3 mL via INTRAVENOUS

## 2012-11-03 MED ORDER — BUPIVACAINE-EPINEPHRINE PF 0.25-1:200000 % IJ SOLN
INTRAMUSCULAR | Status: AC
  Filled 2012-11-03: qty 30

## 2012-11-03 MED ORDER — ACETAMINOPHEN 10 MG/ML IV SOLN
INTRAVENOUS | Status: AC
  Filled 2012-11-03: qty 100

## 2012-11-03 MED ORDER — PHENOL 1.4 % MT LIQD: 1.0000 | OROMUCOSAL | Status: DC | PRN

## 2012-11-03 MED ORDER — OXYCODONE HCL 5 MG/5ML PO SOLN: 5.0000 mg | Freq: Once | ORAL | Status: AC | PRN

## 2012-11-03 MED ORDER — PROPOFOL 10 MG/ML IV BOLUS
INTRAVENOUS | Status: DC | PRN
  Administered 2012-11-03: 130 mg via INTRAVENOUS

## 2012-11-03 MED ORDER — ESTRADIOL 1 MG PO TABS: 1.0000 mg | ORAL_TABLET | Freq: Every day | ORAL | Status: DC

## 2012-11-03 MED ORDER — ROCURONIUM BROMIDE 100 MG/10ML IV SOLN
INTRAVENOUS | Status: DC | PRN
  Administered 2012-11-03: 50 mg via INTRAVENOUS

## 2012-11-03 MED ORDER — SUFENTANIL CITRATE 50 MCG/ML IV SOLN
INTRAVENOUS | Status: DC | PRN
  Administered 2012-11-03: 20 ug via INTRAVENOUS
  Administered 2012-11-03 (×2): 5 ug via INTRAVENOUS

## 2012-11-03 MED ORDER — LACTATED RINGERS IV SOLN
INTRAVENOUS | Status: DC
  Administered 2012-11-03: 18:00:00 via INTRAVENOUS

## 2012-11-03 MED ORDER — OXYCODONE HCL 5 MG PO TABS
10.0000 mg | ORAL_TABLET | ORAL | Status: DC | PRN
  Administered 2012-11-03 (×2): 10 mg via ORAL
  Filled 2012-11-03 (×2): qty 2

## 2012-11-03 MED ORDER — MENTHOL 3 MG MT LOZG: 1.0000 | LOZENGE | OROMUCOSAL | Status: DC | PRN

## 2012-11-03 MED ORDER — FERROUS SULFATE 325 (65 FE) MG PO TABS
325.0000 mg | ORAL_TABLET | Freq: Every day | ORAL | Status: DC
  Administered 2012-11-04: 325 mg via ORAL
  Filled 2012-11-03 (×2): qty 1

## 2012-11-03 MED ORDER — ESTRADIOL 1 MG PO TABS
1.0000 mg | ORAL_TABLET | Freq: Every day | ORAL | Status: DC
  Administered 2012-11-03: 1 mg via ORAL
  Filled 2012-11-03 (×2): qty 1

## 2012-11-03 MED ORDER — ZOLPIDEM TARTRATE 5 MG PO TABS: 5.0000 mg | ORAL_TABLET | Freq: Every evening | ORAL | Status: DC | PRN

## 2012-11-03 MED ORDER — METHOCARBAMOL 100 MG/ML IJ SOLN
500.0000 mg | Freq: Four times a day (QID) | INTRAVENOUS | Status: DC | PRN
  Filled 2012-11-03: qty 5

## 2012-11-03 MED ORDER — EPHEDRINE SULFATE 50 MG/ML IJ SOLN
INTRAMUSCULAR | Status: DC | PRN
  Administered 2012-11-03 (×3): 10 mg via INTRAVENOUS

## 2012-11-03 MED ORDER — PHENYLEPHRINE HCL 10 MG/ML IJ SOLN
INTRAMUSCULAR | Status: DC | PRN
  Administered 2012-11-03: 80 ug via INTRAVENOUS
  Administered 2012-11-03 (×2): 40 ug via INTRAVENOUS
  Administered 2012-11-03 (×2): 80 ug via INTRAVENOUS
  Administered 2012-11-03 (×2): 40 ug via INTRAVENOUS

## 2012-11-03 MED ORDER — LIDOCAINE HCL (CARDIAC) 20 MG/ML IV SOLN
INTRAVENOUS | Status: DC | PRN
  Administered 2012-11-03: 15 mg via INTRAVENOUS

## 2012-11-03 MED ORDER — SODIUM CHLORIDE 0.9 % IJ SOLN
3.0000 mL | INTRAMUSCULAR | Status: DC | PRN
  Administered 2012-11-04: 3 mL via INTRAVENOUS

## 2012-11-03 MED ORDER — HYDROMORPHONE HCL PF 1 MG/ML IJ SOLN: 0.5000 mg | INTRAMUSCULAR | Status: DC | PRN

## 2012-11-03 MED ORDER — MIDAZOLAM HCL 2 MG/2ML IJ SOLN
0.5000 mg | Freq: Once | INTRAMUSCULAR | Status: AC | PRN
  Administered 2012-11-03: 0.5 mg via INTRAVENOUS

## 2012-11-03 MED ORDER — MEPERIDINE HCL 25 MG/ML IJ SOLN: 6.2500 mg | INTRAMUSCULAR | Status: DC | PRN

## 2012-11-03 MED ORDER — HEMOSTATIC AGENTS (NO CHARGE) OPTIME
TOPICAL | Status: DC | PRN
  Administered 2012-11-03: 1 via TOPICAL

## 2012-11-03 MED ORDER — LACTATED RINGERS IV SOLN
INTRAVENOUS | Status: DC | PRN
  Administered 2012-11-03 (×2): via INTRAVENOUS

## 2012-11-03 MED ORDER — MIDAZOLAM HCL 5 MG/5ML IJ SOLN
INTRAMUSCULAR | Status: DC | PRN
  Administered 2012-11-03: 2 mg via INTRAVENOUS

## 2012-11-03 SURGICAL SUPPLY — 51 items
CANISTER SUCTION 2500CC (MISCELLANEOUS) ×2 IMPLANT
CLOTH BEACON ORANGE TIMEOUT ST (SAFETY) ×2 IMPLANT
CLSR STERI-STRIP ANTIMIC 1/2X4 (GAUZE/BANDAGES/DRESSINGS) ×2 IMPLANT
CORDS BIPOLAR (ELECTRODE) ×2 IMPLANT
COVER MAYO STAND STRL (DRAPES) ×2 IMPLANT
COVER PROBE W GEL 5X96 (DRAPES) ×2 IMPLANT
DRAPE C-ARM 42X72 X-RAY (DRAPES) ×2 IMPLANT
DRAPE INCISE IOBAN 85X60 (DRAPES) ×2 IMPLANT
DRAPE SURG 17X23 STRL (DRAPES) IMPLANT
DRAPE U-SHAPE 47X51 STRL (DRAPES) ×4 IMPLANT
DRSG MEPILEX BORDER 4X4 (GAUZE/BANDAGES/DRESSINGS) ×2 IMPLANT
DRSG MEPILEX BORDER 4X8 (GAUZE/BANDAGES/DRESSINGS) ×2 IMPLANT
DURAPREP 26ML APPLICATOR (WOUND CARE) ×2 IMPLANT
ELECT CAUTERY BLADE 6.4 (BLADE) ×2 IMPLANT
ELECT REM PT RETURN 9FT ADLT (ELECTROSURGICAL) ×2
ELECTRODE REM PT RTRN 9FT ADLT (ELECTROSURGICAL) ×1 IMPLANT
GLOVE BIOGEL PI IND STRL 8.5 (GLOVE) ×1 IMPLANT
GLOVE BIOGEL PI INDICATOR 8.5 (GLOVE) ×1
GLOVE ECLIPSE 8.5 STRL (GLOVE) ×2 IMPLANT
GOWN PREVENTION PLUS XXLARGE (GOWN DISPOSABLE) ×2 IMPLANT
GOWN STRL REIN XL XLG (GOWN DISPOSABLE) ×4 IMPLANT
KIT BASIN OR (CUSTOM PROCEDURE TRAY) ×2 IMPLANT
KIT ROOM TURNOVER OR (KITS) ×2 IMPLANT
LAMI NARROW PRIPOLE 16CH (Orthopedic Implant) ×2 IMPLANT
NDL SUT 6 .5 CRC .975X.05 MAYO (NEEDLE) ×1 IMPLANT
NEEDLE 22X1 1/2 (OR ONLY) (NEEDLE) ×2 IMPLANT
NEEDLE MAYO TAPER (NEEDLE) ×1
NEEDLE SPNL 18GX3.5 QUINCKE PK (NEEDLE) ×4 IMPLANT
NS IRRIG 1000ML POUR BTL (IV SOLUTION) ×2 IMPLANT
PACK LAMINECTOMY ORTHO (CUSTOM PROCEDURE TRAY) ×2 IMPLANT
PACK UNIVERSAL I (CUSTOM PROCEDURE TRAY) ×2 IMPLANT
PAD ARMBOARD 7.5X6 YLW CONV (MISCELLANEOUS) ×4 IMPLANT
PROGRAMMER PATIENT (MISCELLANEOUS) ×2 IMPLANT
SPONGE LAP 4X18 X RAY DECT (DISPOSABLE) ×2 IMPLANT
SPONGE SURGIFOAM ABS GEL 100 (HEMOSTASIS) ×2 IMPLANT
STAPLER VISISTAT 35W (STAPLE) ×2 IMPLANT
STIMULATOR IPG PROTEGE SPINAL (Stimulator) ×2 IMPLANT
SURGIFLO TRUKIT (HEMOSTASIS) ×2 IMPLANT
SUT FIBERWIRE #2 38 REV NDL BL (SUTURE) ×2
SUT MNCRL AB 3-0 PS2 18 (SUTURE) ×4 IMPLANT
SUT VIC AB 1 CT1 27 (SUTURE) ×2
SUT VIC AB 1 CT1 27XBRD ANBCTR (SUTURE) ×2 IMPLANT
SUT VIC AB 2-0 CT1 18 (SUTURE) ×2 IMPLANT
SUTURE FIBERWR#2 38 REV NDL BL (SUTURE) ×1 IMPLANT
SYR BULB IRRIGATION 50ML (SYRINGE) ×2 IMPLANT
SYR CONTROL 10ML LL (SYRINGE) ×2 IMPLANT
SYSTEM CHARGING PRODIGY (MISCELLANEOUS) ×2 IMPLANT
TOWEL OR 17X24 6PK STRL BLUE (TOWEL DISPOSABLE) ×2 IMPLANT
TOWEL OR 17X26 10 PK STRL BLUE (TOWEL DISPOSABLE) ×2 IMPLANT
TRAY FOLEY CATH 14FR (SET/KITS/TRAYS/PACK) IMPLANT
WATER STERILE IRR 1000ML POUR (IV SOLUTION) ×2 IMPLANT

## 2012-11-03 NOTE — Progress Notes (Signed)
Received report from [employer]

## 2012-11-03 NOTE — Progress Notes (Signed)
Orthopedic Tech Progress Note Patient Details:  [name] [birth year] [id number] Brace order completed by bio-tech vendor. Patient ID: [id number], female   DOB: Oct 09, 1940, 72 y.o.   MRN: [id number]   [name] 11/03/2012, 3:54 PM

## 2012-11-03 NOTE — H&P (Signed)
No change in clinical exam H+P reviewed  

## 2012-11-03 NOTE — Preoperative (Signed)
Beta Blockers   Reason not to administer Beta Blockers:Metoprolol taken at 0420 on 11/03/12

## 2012-11-03 NOTE — Anesthesia Procedure Notes (Signed)
Procedure Name: Intubation Date/Time: 11/03/2012 7:31 AM Performed by: Charm Barges, Jozy Mcphearson R Pre-anesthesia Checklist: Patient identified, Emergency Drugs available, Suction available, Patient being monitored and Timeout performed Patient Re-evaluated:Patient Re-evaluated prior to inductionOxygen Delivery Method: Circle system utilized Preoxygenation: Pre-oxygenation with 100% oxygen Intubation Type: IV induction Ventilation: Mask ventilation without difficulty Laryngoscope Size: Mac and 3 Grade View: Grade II Tube type: Oral Tube size: 7.5 mm Number of attempts: 1 Airway Equipment and Method: Stylet Placement Confirmation: ETT inserted through vocal cords under direct vision and breath sounds checked- equal and bilateral Secured at: 21 cm Tube secured with: Tape Dental Injury: Teeth and Oropharynx as per pre-operative assessment

## 2012-11-03 NOTE — Anesthesia Preprocedure Evaluation (Addendum)
Anesthesia Evaluation  Patient identified by MRN, date of birth, ID band Patient awake    Reviewed: Allergy & Precautions, H&P , NPO status , Patient's Chart, lab work & pertinent test results, reviewed documented beta blocker date and time   History of Anesthesia Complications Negative for: history of anesthetic complications  Airway Mallampati: I TM Distance: >3 FB Neck ROM: Full    Dental  (+) Teeth Intact, Dental Advisory Given and Caps   Pulmonary neg pulmonary ROS,  breath sounds clear to auscultation  Pulmonary exam normal       Cardiovascular hypertension, Pt. on medications and Pt. on home beta blockers + dysrhythmias (controlled with metoprolol, Holter negative) Rhythm:Regular Rate:Normal  '13 ECHO: normal LVF, valves OK   Neuro/Psych PSYCHIATRIC DISORDERS Depression Chronic back pain: narcotics    GI/Hepatic Neg liver ROS, GERD-  Medicated and Controlled,  Endo/Other  diabetes (glu 166), Well Controlled, Type 2, Oral Hypoglycemic Agents and Insulin DependentMorbid obesity  Renal/GU negative Renal ROS     Musculoskeletal   Abdominal (+) + obese,   Peds  Hematology   Anesthesia Other Findings   Reproductive/Obstetrics                           Anesthesia Physical Anesthesia Plan  ASA: III  Anesthesia Plan: General   Post-op Pain Management:    Induction: Intravenous  Airway Management Planned: Oral ETT  Additional Equipment:   Intra-op Plan:   Post-operative Plan: Extubation in OR  Informed Consent: I have reviewed the patients History and Physical, chart, labs and discussed the procedure including the risks, benefits and alternatives for the proposed anesthesia with the patient or authorized representative who has indicated his/her understanding and acceptance.   Dental advisory given  Plan Discussed with: CRNA, Surgeon and Anesthesiologist  Anesthesia Plan Comments:  (Plan routine monitors, GETA)       Anesthesia Quick Evaluation

## 2012-11-03 NOTE — Anesthesia Postprocedure Evaluation (Signed)
  Anesthesia Post-op Note  Patient: [name]  Procedure(s) Performed: Procedure(s): LUMBAR SPINAL CORD STIMULATOR INSERTION (N/A)  Patient Location: PACU  Anesthesia Type:General  Level of Consciousness: awake, alert , oriented and patient cooperative  Airway and Oxygen Therapy: Patient Spontanous Breathing  Post-op Pain: mild  Post-op Assessment: Post-op Vital signs reviewed, Patient's Cardiovascular Status Stable, Respiratory Function Stable, Patent Airway, No signs of Nausea or vomiting and Pain level controlled  Post-op Vital Signs: Reviewed and stable  Complications: No apparent anesthesia complications

## 2012-11-03 NOTE — Progress Notes (Signed)
Orthopedic Tech Progress Note Patient Details:  [name] 1941/01/07 [id number] Spoke with attending nurse; she called [employer] herself for brace order. Patient ID: [name], female   DOB: [birth year], 72 y.o.   MRN: [id number]   [name] 11/03/2012, 1:35 PM

## 2012-11-03 NOTE — Brief Op Note (Signed)
11/03/2012  9:52 AM  PATIENT:  [name]  72 y.o. female  PRE-OPERATIVE DIAGNOSIS:  CHRONIC PAIN   POST-OPERATIVE DIAGNOSIS:  chronic pain  PROCEDURE:  Procedure(s): LUMBAR SPINAL CORD STIMULATOR INSERTION (N/A)  SURGEON:  Surgeon(s) and Role:    * Venita Lick, MD - Primary  PHYSICIAN ASSISTANT:   ASSISTANTS: none   ANESTHESIA:   general  EBL:  Total I/O In: 1200 [I.V.:1200] Out: 30 [Blood:30]  BLOOD ADMINISTERED:none  DRAINS: none   LOCAL MEDICATIONS USED:  MARCAINE     SPECIMEN:  No Specimen  DISPOSITION OF SPECIMEN:  N/A  COUNTS:  YES  TOURNIQUET:  * No tourniquets in log *  DICTATION: .Other Dictation: Dictation Number [id number]  PLAN OF CARE: Admit for overnight observation  PATIENT DISPOSITION:  PACU - hemodynamically stable.

## 2012-11-03 NOTE — Transfer of Care (Signed)
Immediate Anesthesia Transfer of Care Note  Patient: [name]  Procedure(s) Performed: Procedure(s): LUMBAR SPINAL CORD STIMULATOR INSERTION (N/A)  Patient Location: PACU  Anesthesia Type:General  Level of Consciousness: awake  Airway & Oxygen Therapy: Patient Spontanous Breathing and Patient connected to nasal cannula oxygen  Post-op Assessment: Report given to PACU RN, Post -op Vital signs reviewed and stable and Patient moving all extremities  Post vital signs: Reviewed and stable  Complications: No apparent anesthesia complications

## 2012-11-04 ENCOUNTER — Encounter (HOSPITAL_COMMUNITY): Payer: Self-pay | Admitting: Orthopedic Surgery

## 2012-11-04 LAB — GLUCOSE, CAPILLARY
Glucose-Capillary: 173 mg/dL — ABNORMAL HIGH (ref 70–99)
Glucose-Capillary: 188 mg/dL — ABNORMAL HIGH (ref 70–99)

## 2012-11-04 MED ORDER — ONDANSETRON HCL 4 MG PO TABS: 4.0000 mg | ORAL_TABLET | Freq: Three times a day (TID) | ORAL | Status: DC | PRN

## 2012-11-04 MED ORDER — POLYETHYLENE GLYCOL 3350 17 GM/SCOOP PO POWD: 17.0000 g | Freq: Every day | ORAL | Status: DC

## 2012-11-04 MED ORDER — OXYCODONE-ACETAMINOPHEN 10-325 MG PO TABS: 1.0000 | ORAL_TABLET | ORAL | Status: DC | PRN

## 2012-11-04 MED ORDER — DOCUSATE SODIUM 100 MG PO CAPS: 100.0000 mg | ORAL_CAPSULE | Freq: Three times a day (TID) | ORAL | Status: DC | PRN

## 2012-11-04 MED ORDER — METHOCARBAMOL 500 MG PO TABS: 500.0000 mg | ORAL_TABLET | Freq: Three times a day (TID) | ORAL | Status: DC | PRN

## 2012-11-04 NOTE — Op Note (Signed)
NAMETOCARA, MENNEN             ACCOUNT NO.:  [id number]  MEDICAL RECORD NO.:  [id number]  LOCATION:  [id number]                        FACILITY:  [facility]  PHYSICIAN:  Alvy Beal, MD    DATE OF BIRTH:  [birth year]  DATE OF PROCEDURE:  11/03/2012 DATE OF DISCHARGE:                              OPERATIVE REPORT   PREOPERATIVE DIAGNOSIS:  Chronic pain.  POSTOPERATIVE DIAGNOSIS:  Chronic pain.  OPERATIVE PROCEDURE:  Spinal cord stimulator implantation.  COMPLICATIONS:  None.  CONDITION:  Stable.  INSTRUMENTATION SYSTEM USED:  St. Jude system.  This is a very pleasant elderly woman who has been having severe chronic debilitating pain.  She had a previous L4-S1 instrumented fusion, but continued to have severe pain.  As a result of the ongoing discomfort and loss of quality of life, she had a spinal cord stimulator trial done and had excellent results.  As a result, she was referred to me for definitive implantation.  All appropriate risks, benefits, and alternatives were discussed with the patient and consent was obtained.  OPERATIVE NOTE:  The patient was brought to the operating room, placed supine on the operating table.  After successful induction of general anesthesia and endotracheal intubation, TEDs, SCDs were applied.  The patient was then turned prone onto the Wilson frame.  All bony prominences were well-padded and the back was prepped and draped in a standard fashion.  Time-out was done to confirm patient, procedure, and all other pertinent important data.  Once this was completed, an x-ray was brought sterilely into the field, and we identified the L4 pedicle screw.  I then counted up from the L4 pedicle screw in the lateral plane until I was at the T10 vertebral body.  Once I confirmed this, I then marked the area for skin incision.  I infiltrated this with 0.25% Marcaine and made a midline incision starting at the superior aspect of the T10 spinous process and  proceeding to the inferior aspect of T11 spinous process.  Sharp dissection was carried out down to the deep fascia.  Deep fascia was sharply incised and exposed the T10 and T11 spinous process and lamina.  Self-retaining retractors were placed and then I again counted up from the L5 vertebral body to the T10 vertebral body with lateral fluoroscopy.  Once I confirmed a second time that I was at the appropriate level, I then proceeded with the laminotomy.  A double-action Leksell rongeur was used to remove the bulk of the T10 spinous process.  I then used a fine nerve curette to develop a plane underneath the lamina.  I then performed a generous laminotomy of T10. I then used Penfield 4 to dissect the ventral raphe of the ligamentum flavum and then I used 2 mm Kerrison to resect the ligamentum flavum and expose the underlying thecal sac.  Once this was properly exposed underneath the dural spatula, which could easily pass up to the T8-9 disk space.  Once this was done, I then took the actual implant and advanced it gently.  There was no traction or tension placed on the thecal sac.  Once it was properly positioned, I confirmed with AP and lateral x-rays that I was  in the correct position.  It spanned from the T8-9 disk space down to the superior portion at T10 vertebral body. According to the [facility] rep this was exactly where the trial device was being maximally programmed.  I then placed the plastic strain gauge onto the leads and placed 2 bone holes into the spinous process of T11. I then sutured the leads down to the spinous process using FiberWire.  I then made a second incision on the right-hand side near the gluteal region and created a pocket 2.5 cm deep.  I then used the submuscular transferring device to pass the leads from the thoracic wound to the battery site.  These were secured to the battery and locked in place according to the manufacture's standards.  I then secured the  battery with two #1 Vicryl sutures into the battery's pocket.  The battery was then tested and noted to be functioning fine.  Both wounds were then copiously irrigated with normal saline.  I used bipolar electrocautery to obtain hemostasis.  I closed in a similar fashion using #1 Vicryl sutures for the deep fascia, 2-0 Vicryl sutures for superficial, and 3-0 Monocryl for the skin.  Steri-Strips and dry dressing were applied.  The patient was extubated, transferred to PACU without incident.  At the end of the case, all needle and sponge counts were correct.  There was no adverse intraoperative events.     Alvy Beal, MD     DDB/MEDQ  D:  11/03/2012  T:  11/04/2012  Job:  [id number]

## 2012-11-04 NOTE — Plan of Care (Signed)
D/c instructions given to pt. Med scripts given to pt. Pt is stable to be d/c.  

## 2012-11-04 NOTE — Progress Notes (Signed)
OT Cancellation Note  Patient Details Name: ALANNI VADER MRN: [id number] DOB: May 13, 1940   Cancelled Treatment:    Reason Eval/Treat Not Completed: OT screened, no needs identified, will sign off   Earlie Raveling OTR/L [phone]  11/04/2012, 10:38 AM

## 2012-11-04 NOTE — Progress Notes (Signed)
    Subjective: Procedure(s) (LRB): LUMBAR SPINAL CORD STIMULATOR INSERTION (N/A) 1 Day Post-Op  Patient reports pain as 3 on 0-10 scale.  Reports decreased leg pain reports incisional back pain   Positive void Negative bowel movement Positive flatus Negative chest pain or shortness of breath  Objective: Vital signs in last 24 hours: Temp:  [97.1 F (36.2 C)-98.4 F (36.9 C)] 97.7 F (36.5 C) (06/27 0551) Pulse Rate:  [44-91] 72 (06/27 0551) Resp:  [9-20] 18 (06/27 0551) BP: (99-133)/(43-80) 105/58 mmHg (06/27 0551) SpO2:  [92 %-100 %] 99 % (06/27 0551)  Intake/Output from previous day: 06/26 0701 - 06/27 0700 In: 1410 [P.O.:60; I.V.:1350] Out: 30 [Blood:30]  Labs: No results found for this basename: WBC, RBC, HCT, PLT,  in the last 72 hours No results found for this basename: NA, K, CL, CO2, BUN, CREATININE, GLUCOSE, CALCIUM,  in the last 72 hours No results found for this basename: LABPT, INR,  in the last 72 hours  Physical Exam: Neurologically intact ABD soft Neurovascular intact Incision: dressing C/D/I and no drainage Compartment soft  Assessment/Plan: Patient stable  xrays n/a Continue mobilization with physical therapy Continue care  Advance diet Up with therapy D/C IV fluids Plan on d/c to home today Stimulator functioning  Venita Lick, MD [facility] [phone]

## 2012-11-04 NOTE — Clinical Social Work Note (Signed)
Clinical Social Worker received referral for possible ST-SNF placement.  Chart reviewed.  PT/OT have not worked with patient at this time, however per notes, patient has already been discharged by MD.  Inappropriate standing order at this time.   CSW signing off - please re consult if social work needs arise.  Macario Golds, [place] [phone]

## 2012-11-04 NOTE — Discharge Summary (Signed)
Patient ID: [name] MRN: [id number] DOB/AGE: May 02, 1941 72 y.o.  Admit date: 11/03/2012 Discharge date: 11/04/2012  Admission Diagnoses:  Active Problems:   * No active hospital problems. *   Discharge Diagnoses:  Active Problems:   * No active hospital problems. *  status post Procedure(s): LUMBAR SPINAL CORD STIMULATOR INSERTION  Past Medical History  Diagnosis Date  . Diabetes mellitus   . Anemia   . DVT (deep venous thrombosis)   . GERD (gastroesophageal reflux disease)   . Rapid heartbeat   . Hypertension     dr Anne Fu  . Arthritis     Surgeries: Procedure(s): LUMBAR SPINAL CORD STIMULATOR INSERTION on 11/03/2012   Consultants:  none  Discharged Condition: Improved  Hospital Course: [name] is an 72 y.o. female who was admitted 11/03/2012 for operative treatment of <principal problem not specified>. Patient failed conservative treatments (please see the history and physical for the specifics) and had severe unremitting pain that affects sleep, daily activities and work/hobbies. After pre-op clearance, the patient was taken to the operating room on 11/03/2012 and underwent  Procedure(s): LUMBAR SPINAL CORD STIMULATOR INSERTION.    Patient was given perioperative antibiotics: Anti-infectives   Start     Dose/Rate Route Frequency Ordered Stop   11/03/12 1400  ceFAZolin (ANCEF) IVPB 1 g/50 mL premix     1 g 100 mL/hr over 30 Minutes Intravenous 3 times per day 11/03/12 1227 11/03/12 2237   11/03/12 1200  ceFAZolin (ANCEF) IVPB 1 g/50 mL premix  Status:  Discontinued     1 g 100 mL/hr over 30 Minutes Intravenous Every 8 hours 11/03/12 1147 11/03/12 1224   11/02/12 1425  ceFAZolin (ANCEF) IVPB 2 g/50 mL premix     2 g 100 mL/hr over 30 Minutes Intravenous 30 min pre-op 11/02/12 1425 11/03/12 0720       Patient was given sequential compression devices and early ambulation to prevent DVT.   Patient benefited maximally from hospital stay and  there were no complications. At the time of discharge, the patient was urinating/moving their bowels without difficulty, tolerating a regular diet, pain is controlled with oral pain medications and they have been cleared by PT/OT.   Recent vital signs: Patient Vitals for the past 24 hrs:  BP Temp Temp src Pulse Resp SpO2  11/04/12 0551 105/58 mmHg 97.7 F (36.5 C) Oral 72 18 99 %  11/04/12 0248 107/59 mmHg 97.5 F (36.4 C) Oral 81 18 98 %  11/03/12 2330 111/58 mmHg 97.7 F (36.5 C) Oral 66 18 98 %  11/03/12 1800 115/71 mmHg 98.1 F (36.7 C) Oral 91 20 94 %  11/03/12 1700 110/80 mmHg - - 80 - -  11/03/12 1600 133/70 mmHg 97.6 F (36.4 C) Oral 44 20 94 %  11/03/12 1124 99/52 mmHg - - - - -  11/03/12 1123 - - - 65 10 99 %  11/03/12 1122 - 97.1 F (36.2 C) - 70 12 100 %  11/03/12 1115 105/43 mmHg - - 71 13 99 %  11/03/12 1100 111/58 mmHg - - 64 9 92 %  11/03/12 1045 119/63 mmHg - - 67 15 98 %  11/03/12 1030 120/65 mmHg - - 64 17 100 %  11/03/12 1025 111/69 mmHg - - 68 18 99 %  11/03/12 1020 - - - 68 13 99 %  11/03/12 1015 125/60 mmHg - - 70 13 99 %  11/03/12 1014 - - - 70 14 99 %  11/03/12 1009 - - - 71 9 98 %  11/03/12 1000 - - - 76 11 99 %  11/03/12 0945 126/68 mmHg 98.4 F (36.9 C) - 76 11 100 %     Recent laboratory studies: No results found for this basename: WBC, HGB, HCT, PLT, NA, K, CL, CO2, BUN, CREATININE, GLUCOSE, PT, INR, CALCIUM, 2,  in the last 72 hours   Discharge Medications:     Medication List    TAKE these medications       Biotin 5000 MCG Tabs  Take 5,000 mg by mouth daily.     BYETTA 10 MCG PEN [place]  Inject 10 Units into the skin 2 (two) times daily.     calcium carbonate 600 MG Tabs  Commonly known as:  OS-CAL  Take 600 mg by mouth daily.     Co-Enzyme Q10 200 MG Caps  Take 200 mg by mouth every evening.     diltiazem 120 MG 24 hr capsule  Commonly known as:  DILACOR XR  Take 120 mg by mouth every evening.     docusate sodium 100 MG capsule    Commonly known as:  COLACE  Take 1 capsule (100 mg total) by mouth 3 (three) times daily as needed for constipation.     estradiol 1 MG tablet  Commonly known as:  ESTRACE  Take 1 mg by mouth daily.     ferrous sulfate 325 (65 FE) MG tablet  Take 325 mg by mouth daily with breakfast.     fish oil-omega-3 fatty acids 1000 MG capsule  Take 1 g by mouth 2 (two) times daily.     glipiZIDE-metformin 5-500 MG per tablet  Commonly known as:  METAGLIP  Take 2 tablets by mouth 2 (two) times daily before a meal.     insulin detemir 100 unit/ml Soln  Commonly known as:  LEVEMIR  Inject 25 Units into the skin daily.     losartan 100 MG tablet  Commonly known as:  COZAAR  Take 100 mg by mouth every evening.     Magnesium 500 MG Tabs  Take 1,000 mg by mouth at bedtime.     methocarbamol 500 MG tablet  Commonly known as:  ROBAXIN  Take 1 tablet (500 mg total) by mouth 3 (three) times daily as needed.     metoprolol succinate 100 MG 24 hr tablet  Commonly known as:  TOPROL-XL  Take 100 mg by mouth 2 (two) times daily. Take with or immediately following a meal.     multivitamin with minerals tablet  Take 1 tablet by mouth daily.     niacinamide 500 MG tablet  Take 500 mg by mouth daily.     omeprazole 20 MG capsule  Commonly known as:  PRILOSEC  Take 20-40 mg by mouth See admin instructions. Takes 20mg  every day in the morning and 20mg  in the evening as needed for reflux     ondansetron 4 MG tablet  Commonly known as:  ZOFRAN  Take 1 tablet (4 mg total) by mouth every 8 (eight) hours as needed for nausea.     oxyCODONE-acetaminophen 10-325 MG per tablet  Commonly known as:  PERCOCET  Take 1 tablet by mouth every 4 (four) hours as needed for pain.     polyethylene glycol powder powder  Commonly known as:  GLYCOLAX  Take 17 g by mouth daily.     rosuvastatin 10 MG tablet  Commonly known as:  CRESTOR  Take 10 mg  by mouth every evening.     SUPER B COMPLEX PO  Take 1  tablet by mouth daily. Plus vitamin c     Vitamin D 2000 UNITS tablet  Take 2,000 Units by mouth daily.        Diagnostic Studies: Dg Chest 2 View  10/27/2012   *RADIOLOGY REPORT*  Clinical Data: Preop  CHEST - 2 VIEW  Comparison: 10/23/2010  Findings: Cardiomediastinal silhouette is stable.  Small calcified pleural plaques in the left upper lobe are stable.  No acute infiltrate or pleural effusion.  No pulmonary edema. Mild degenerative changes thoracic spine.  IMPRESSION: No active disease.  No significant change.   Original Report Authenticated By: Natasha Mead, M.D.   Dg Thoracic Spine 2 View  11/03/2012   *RADIOLOGY REPORT*  Clinical Data: Spinal cord stimulator insertion  DG C-ARM 1-60 MIN,THORACIC SPINE - 2 VIEW  Comparison:  Chest radiograph - 10/27/2012; chest CT - 12/12/2010  Fluoroscopy time:  29-seconds  Findings:  Two spot intraoperative radiographic images of the inferior thoracic spine are provided for review.  Images demonstrate a spinal stimulator within the spinal canal tip regional to the T9 - T10 intervertebral disc space, though note, exact spinal labeling is difficult secondary to coned field of view.  An esophageal pH probe is noted within the distal esophagus. Limited visualization of adjacent thorax is normal.  Regional soft tissues are normal.  IMPRESSION: Spinal stimulator device within the spinal canal of the inferior thoracic spine.   Original Report Authenticated By: Tacey Ruiz, MD   Dg C-arm 1-60 Min  11/03/2012   *RADIOLOGY REPORT*  Clinical Data: Spinal cord stimulator insertion  DG C-ARM 1-60 MIN,THORACIC SPINE - 2 VIEW  Comparison:  Chest radiograph - 10/27/2012; chest CT - 12/12/2010  Fluoroscopy time:  29-seconds  Findings:  Two spot intraoperative radiographic images of the inferior thoracic spine are provided for review.  Images demonstrate a spinal stimulator within the spinal canal tip regional to the T9 - T10 intervertebral disc space, though note, exact spinal  labeling is difficult secondary to coned field of view.  An esophageal pH probe is noted within the distal esophagus. Limited visualization of adjacent thorax is normal.  Regional soft tissues are normal.  IMPRESSION: Spinal stimulator device within the spinal canal of the inferior thoracic spine.   Original Report Authenticated By: Tacey Ruiz, MD          Follow-up Information   Follow up with Alvy Beal, MD. Schedule an appointment as soon as possible for a visit in 2 weeks.   Contact information:   [address] [place] [address] [phone]       Discharge Plan:  discharge to home  Disposition: stable    Signed: Venita Lick D for Dr. Venita Lick [facility] [phone] 11/04/2012, 7:44 AM

## 2012-11-04 NOTE — Progress Notes (Signed)
UR COMPLETED  

## 2012-11-04 NOTE — Evaluation (Signed)
Physical Therapy Evaluation Patient Details Name: [name] MRN: [id number] DOB: October 30, 1940 Today's Date: 11/04/2012 Time: 4098-1191 PT Time Calculation (min): 10 min  PT Assessment / Plan / Recommendation History of Present Illness  Pt s/p insertion of spinal cord stimulator.  Clinical Impression  Pt doing well and no further PT required.    PT Assessment  Patent does not need any further PT services    Follow Up Recommendations  No PT follow up    Does the patient have the potential to tolerate intense rehabilitation      Barriers to Discharge        Equipment Recommendations  None recommended by PT    Recommendations for Other Services     Frequency      Precautions / Restrictions Precautions Precautions: Back Required Braces or Orthoses: Spinal Brace Spinal Brace: Lumbar corset;Applied in sitting position   Pertinent Vitals/Pain Incisional soreness.      Mobility  Bed Mobility Bed Mobility: Left Sidelying to Sit;Sit to Sidelying Left;Rolling Left Rolling Left: 6: Modified independent (Device/Increase time) Left Sidelying to Sit: 6: Modified independent (Device/Increase time);HOB flat Sit to Sidelying Left: 6: Modified independent (Device/Increase time);HOB flat Details for Bed Mobility Assistance: incr time Transfers Transfers: Sit to Stand;Stand to Sit Sit to Stand: 6: Modified independent (Device/Increase time);With upper extremity assist;From bed Stand to Sit: 6: Modified independent (Device/Increase time);With upper extremity assist;To bed Ambulation/Gait Ambulation/Gait Assistance: 6: Modified independent (Device/Increase time) Ambulation Distance (Feet): 300 Feet Assistive device: None Ambulation/Gait Assistance Details: walks with legs in external rotation. Gait Pattern: Wide base of support;Step-through pattern;Decreased stride length Gait velocity: decr    Exercises     PT Diagnosis:    PT Problem List:   PT Treatment Interventions:        PT Goals(Current goals can be found in the care plan section)    Visit Information  Last PT Received On: 11/04/12 Assistance Needed: +1 History of Present Illness: Pt s/p insertion of spinal cord stimulator.       Prior Functioning  Home Living Family/patient expects to be discharged to:: Private residence Living Arrangements: Spouse/significant other Available Help at Discharge: Family Type of Home: House Home Access: Stairs to enter Secretary/administrator of Steps: 2 Entrance Stairs-Rails: Right;Can reach both;Left Home Layout: One level Home Equipment: Shower seat Prior Function Level of Independence: Independent Communication Communication: No difficulties    Cognition  Cognition Arousal/Alertness: Awake/alert Behavior During Therapy: WFL for tasks assessed/performed Overall Cognitive Status: Within Functional Limits for tasks assessed    Extremity/Trunk Assessment Upper Extremity Assessment Upper Extremity Assessment: Overall WFL for tasks assessed Lower Extremity Assessment Lower Extremity Assessment: Overall WFL for tasks assessed   Balance Balance Balance Assessed: Yes Static Standing Balance Static Standing - Balance Support: No upper extremity supported Static Standing - Level of Assistance: 7: Independent  End of Session PT - End of Session Activity Tolerance: Patient tolerated treatment well Patient left: in bed (sitting) Nurse Communication: Mobility status  GP Functional Assessment Tool Used: clinical judgement Functional Limitation: Mobility: Walking and moving around Mobility: Walking and Moving Around Current Status (Y7829): 0 percent impaired, limited or restricted Mobility: Walking and Moving Around Goal Status (F6213): 0 percent impaired, limited or restricted Mobility: Walking and Moving Around Discharge Status [phone]): 0 percent impaired, limited or restricted   [facility] 11/04/2012, 11:07 AM  [name] PT [phone]

## 2013-01-19 ENCOUNTER — Other Ambulatory Visit (HOSPITAL_COMMUNITY): Payer: Self-pay | Admitting: Cardiology

## 2013-01-19 DIAGNOSIS — R0602 Shortness of breath: Secondary | ICD-10-CM

## 2013-01-31 ENCOUNTER — Encounter (HOSPITAL_COMMUNITY): Payer: Medicare Other

## 2013-02-13 ENCOUNTER — Ambulatory Visit (HOSPITAL_COMMUNITY)
Admission: RE | Admit: 2013-02-13 | Discharge: 2013-02-13 | Disposition: A | Discharge status: Home / Self Care | Payer: Medicare Other | Source: Ambulatory Visit | Attending: Cardiology | Admitting: Cardiology

## 2013-02-13 DIAGNOSIS — R0602 Shortness of breath: Secondary | ICD-10-CM | POA: Insufficient documentation

## 2013-02-13 DIAGNOSIS — R0989 Other specified symptoms and signs involving the circulatory and respiratory systems: Secondary | ICD-10-CM | POA: Insufficient documentation

## 2013-02-13 DIAGNOSIS — R0609 Other forms of dyspnea: Secondary | ICD-10-CM | POA: Insufficient documentation

## 2013-02-13 MED ORDER — ALBUTEROL SULFATE (5 MG/ML) 0.5% IN NEBU
2.5000 mg | INHALATION_SOLUTION | Freq: Once | RESPIRATORY_TRACT | Status: AC
  Administered 2013-02-13: 2.5 mg via RESPIRATORY_TRACT

## 2013-02-28 ENCOUNTER — Telehealth: Payer: Self-pay | Admitting: Cardiology

## 2013-02-28 NOTE — Telephone Encounter (Signed)
New message    Want breathing test results

## 2013-03-02 NOTE — Telephone Encounter (Signed)
Called the Respiratory Department left message to obtain results, Epic shows the patient went to appt. ,but the results are not in Epic.

## 2013-03-02 NOTE — Telephone Encounter (Signed)
pt notified of pft.Lung function study showed mild obstructive lung disease also with suggested restrictive component. Normal DLCO, diffusion capacity. FEV1 81%. This may be in part responsible for your continued shortness of breath. Mild COPD.will forward to Dr.Gates.pt verbalized understanding

## 2013-03-23 ENCOUNTER — Encounter (INDEPENDENT_AMBULATORY_CARE_PROVIDER_SITE_OTHER): Payer: Self-pay

## 2013-03-28 ENCOUNTER — Telehealth: Payer: Self-pay | Admitting: Cardiology

## 2013-03-28 NOTE — Telephone Encounter (Signed)
New Problem:  Pt has a billing question regarding her PFT/respiratory therapy done at [facility] on 10/6. Dr. Dione Housekeeper ordered the test. However, the test was not pre-authorized with the pt's insurance. I contacted Charmaine in our Billing dept. She advised me sending a msg to Dr. Dione Housekeeper nurse to review this information. Please call pt back letting her know why the test had not been pre-cert/authorized. Please advise

## 2013-03-30 NOTE — Telephone Encounter (Signed)
I called [facility] @ [phone] and spoke with Elenore Rota.  She states the patient has a 0.00 balance and her 02/13/13 PFT was fully covered.   I then called [employer] at [phone] and spoke with Lonia Blood. She states the letter that was sent out was not a denial for non authorization, she stated that the charge for the albuterol that was given (CPT [phone]) during her PFT appointment was a non covered code and that if she has a balance owed to [facility] for that charge that her prescription coverage should be the ones to cover those fees. Per Margaretmary Lombard the charge for albuterol was 44.04 and she stated it is usually up to the physician if they write that fee off or not.  I am thinking it has been written off since [employer] states she has a 0.00 balance.  Per Lonia Blood. There is no precert required for PFTs.    I called Mrs. Cumberland's home # and spoke with her husband.  He stated he thinks he understands, however would like for his wife to call me back later today so I can explain it to her.  I told him I would be more than happy to talk with Mrs. Darnelle Bos when she calls me back and that I would be available until 4:00 today.    I am routing this to [facility] so he will know this is taken care of.  You may close this encounter when it is read, I am awaiting Mrs. Krenzer's return call.  Please let me know if I can do anything else.

## 2013-04-03 ENCOUNTER — Other Ambulatory Visit: Payer: Self-pay | Admitting: Orthopedic Surgery

## 2013-04-03 DIAGNOSIS — S8990XA Unspecified injury of unspecified lower leg, initial encounter: Secondary | ICD-10-CM

## 2013-04-03 DIAGNOSIS — S99919A Unspecified injury of unspecified ankle, initial encounter: Secondary | ICD-10-CM

## 2013-04-10 ENCOUNTER — Ambulatory Visit
Admission: RE | Admit: 2013-04-10 | Discharge: 2013-04-10 | Disposition: A | Discharge status: Home / Self Care | Payer: Medicare Other | Source: Ambulatory Visit | Attending: Orthopedic Surgery | Admitting: Orthopedic Surgery

## 2013-04-10 DIAGNOSIS — S8990XA Unspecified injury of unspecified lower leg, initial encounter: Secondary | ICD-10-CM

## 2013-08-06 ENCOUNTER — Encounter: Payer: Self-pay | Admitting: *Deleted

## 2014-01-19 ENCOUNTER — Ambulatory Visit (INDEPENDENT_AMBULATORY_CARE_PROVIDER_SITE_OTHER): Payer: Medicare Other | Admitting: Cardiology

## 2014-01-19 ENCOUNTER — Encounter: Payer: Self-pay | Admitting: Cardiology

## 2014-01-19 VITALS — BP 120/74 | HR 94 | Ht 66.0 in | Wt 192.0 lb

## 2014-01-19 DIAGNOSIS — E7800 Pure hypercholesterolemia, unspecified: Secondary | ICD-10-CM | POA: Insufficient documentation

## 2014-01-19 DIAGNOSIS — R0609 Other forms of dyspnea: Secondary | ICD-10-CM | POA: Insufficient documentation

## 2014-01-19 DIAGNOSIS — I10 Essential (primary) hypertension: Secondary | ICD-10-CM | POA: Insufficient documentation

## 2014-01-19 DIAGNOSIS — E669 Obesity, unspecified: Secondary | ICD-10-CM

## 2014-01-19 DIAGNOSIS — R0600 Dyspnea, unspecified: Secondary | ICD-10-CM | POA: Insufficient documentation

## 2014-01-19 DIAGNOSIS — R0989 Other specified symptoms and signs involving the circulatory and respiratory systems: Secondary | ICD-10-CM

## 2014-01-19 MED ORDER — METOPROLOL SUCCINATE ER 100 MG PO TB24: 100.0000 mg | ORAL_TABLET | Freq: Every day | ORAL | Status: DC

## 2014-01-19 NOTE — Patient Instructions (Signed)
The current medical regimen is effective;  continue present plan and medications.  Follow up as needed 

## 2014-01-19 NOTE — Progress Notes (Signed)
[facility]. [address], [address], [place]  [address] Phone: [phone] Fax:  [phone]  Date:  01/19/2014   ID:  [name], DOB [birth year], MRN [id number]  PCP:  Lori Screws, MD   History of Present Illness: [name] is a 73 y.o. female for follow up of dyspnea on exertion. This has been  ever since she had her knee surgery she states. Chest x-ray was unremarkable. Dr. Inda [name] walked her around the office and she became quite short of breath but her oxygen saturations maintained at 98%. No wheezes, no obvious chest pain. She has not been having any claudication. While doing water aerobics for approximately an hour she feels fine.  Cardiac risk factors of diabetes and history of DVT in 2003. Her father had a heart attack in his 7s. Her hemoglobin was 12.4.   In 2012 and 2014, she underwent a nuclear stress test which was low risk, no ischemia. She's also previously undergone CT scan to rule out pulmonary embolism which was negative. Echocardiogram performed on 12/06/12 was overall reassuring. Normal EF.   Feeling better after cutting back on medication. Metoprolol cut back to once a day. Diltiazem DC'd. Lost 20 pounds. Great.   Wt Readings from Last 3 Encounters:  01/19/14 192 lb (87.091 kg)  10/27/12 214 lb 11.2 oz (97.387 kg)     Past Medical History  Diagnosis Date  . Diabetes mellitus   . Anemia   . DVT (deep venous thrombosis)   . GERD (gastroesophageal reflux disease)   . Rapid heartbeat   . Hypertension     dr [name]  . Arthritis     Past Surgical History  Procedure Laterality Date  . Carpal tunnel release Right 2014  . Joint replacement Right 2012    knee  . Arthoscopic rotaor cuff repair Left 2010 or 2011  . Back surgery  x 2 1999 and 2008    lower back L 4 to L 5 L5 to S 1  . Ovarian wedge resection  1966  . Abdominal hysterectomy  1981  . Tonsillectomy  1960  . Colonoscopy with propofol N/A 10/25/2012    Procedure:  COLONOSCOPY WITH PROPOFOL;  Surgeon: Lori Fair, MD;  Location: [facility];  Service: Endoscopy;  Laterality: N/A;  . Spinal cord stimulator insertion N/A 11/03/2012    Procedure: LUMBAR SPINAL CORD STIMULATOR INSERTION;  Surgeon: [name], MD;  Location: [facility];  Service: Orthopedics;  Laterality: N/A;    Current Outpatient Prescriptions  Medication Sig Dispense Refill  . B Complex-C (SUPER B COMPLEX PO) Take 1 tablet by mouth daily. Plus vitamin c      . Biotin 5000 MCG TABS Take 5,000 mg by mouth daily.       . calcium carbonate (OS-CAL) 600 MG TABS Take 600 mg by mouth daily.      . Cholecalciferol (VITAMIN D) 2000 UNITS tablet Take 2,000 Units by mouth daily.      Marland Kitchen Co-Enzyme Q10 200 MG CAPS Take 200 mg by mouth every evening.      . DULoxetine (CYMBALTA) 20 MG capsule       . estradiol (ESTRACE) 1 MG tablet Take 1 mg by mouth daily.      . fish oil-omega-3 fatty acids 1000 MG capsule Take 1 g by mouth 2 (two) times daily.       Marland Kitchen glipiZIDE-metformin (METAGLIP) 5-500 MG per tablet Take 2 tablets by mouth 2 (two) times daily  before a meal.      . HYDROcodone-acetaminophen (NORCO) 7.5-325 MG per tablet       . insulin detemir (LEVEMIR) 100 unit/ml SOLN Inject 50 Units into the skin daily.       Marland Kitchen losartan (COZAAR) 100 MG tablet Take 100 mg by mouth every evening.       . Magnesium 500 MG TABS Take 500 mg by mouth at bedtime.       . metoprolol succinate (TOPROL-XL) 100 MG 24 hr tablet Take 100 mg by mouth 2 (two) times daily. Take with or immediately following a meal.      . Multiple Vitamins-Minerals (MULTIVITAMIN WITH MINERALS) tablet Take 1 tablet by mouth daily.      . niacinamide 500 MG tablet Take 500 mg by mouth daily.      . rosuvastatin (CRESTOR) 10 MG tablet Take 10 mg by mouth every evening.        No current facility-administered medications for this visit.    Allergies:    Allergies  Allergen Reactions  . Hydrochlorothiazide Anaphylaxis    low bp  . Lyrica  [Pregabalin] Other (See Comments)    Weight gain, drowsy    Social History:  The patient  reports that she has never smoked. She has never used smokeless tobacco. She reports that she does not drink alcohol or use illicit drugs.   No family history on file.  ROS:  Please see the history of present illness.   Denies any fevers, chills, orthopnea, PND.   All other systems reviewed and negative.   PHYSICAL EXAM: VS:  BP 120/74  Pulse 94  Ht 5\' 6"  (1.676 m)  Wt 192 lb (87.091 kg)  BMI 31.00 kg/m2 Well nourished, well developed, in no acute distress HEENT: normal, [place]/AT, EOMI Neck: no JVD, normal carotid upstroke, no bruit Cardiac:  normal S1, S2; RRR; no murmur Lungs:  clear to auscultation bilaterally, no wheezing, rhonchi or rales Abd: soft, nontender, no hepatomegaly, no bruits Ext: no edema, 2+ distal pulses Skin: warm and dry GU: deferred Neuro: no focal abnormalities noted, AAO x 3  EKG:  01/19/14-sinus rhythm, septal infarct pattern     ASSESSMENT AND PLAN:  1. Dyspnea-improved. She has lost approximately 20 pounds. She has cut back on her medication. Excellent. Heart rate is currently very reasonable. No changes made. I will see her back on as-needed basis. Please let me know if I can be of further assistance. 2. Hyperlipidemia-Crestor. Dr. Inda [name] will continue to manage. 3. Hypertension-currently well controlled. Excellent job with weight loss. 4. Obesity-excellent job with weight loss. Water aerobics.  Signed, Lori Furbish, MD [facility]  01/19/2014 2:31 PM

## 2014-04-03 ENCOUNTER — Other Ambulatory Visit: Payer: Self-pay | Admitting: Radiology

## 2014-05-11 DIAGNOSIS — Z4542 Encounter for adjustment and management of neuropacemaker (brain) (peripheral nerve) (spinal cord): Secondary | ICD-10-CM

## 2014-05-11 HISTORY — DX: Encounter for adjustment and management of neurostimulator: Z45.42

## 2015-04-23 ENCOUNTER — Encounter: Payer: Self-pay | Admitting: *Deleted

## 2015-04-24 ENCOUNTER — Ambulatory Visit (INDEPENDENT_AMBULATORY_CARE_PROVIDER_SITE_OTHER): Payer: Medicare Other | Admitting: Pulmonary Disease

## 2015-04-24 ENCOUNTER — Encounter: Payer: Self-pay | Admitting: Pulmonary Disease

## 2015-04-24 VITALS — BP 140/82 | HR 101 | Ht 66.0 in | Wt 206.2 lb

## 2015-04-24 DIAGNOSIS — R0600 Dyspnea, unspecified: Secondary | ICD-10-CM | POA: Diagnosis not present

## 2015-04-24 NOTE — Progress Notes (Signed)
Subjective:    Patient ID: [name], female    DOB: 09-Oct-1940, 74 y.o.   MRN: [id number]  HPI Consult for evaluation of cough, dyspnea  [name] is a 74 year old with past medical history as below. She complains of worsening dyspnea on exertion for the past 3 and half to 4 years. She has multiple episodes  of bronchitis usually 2-3 times a year which is treated with antibiotics, prednisone. She had her last attack a few months ago but has been asymptomatic since then. Her main complaints are dyspnea on exertion. She does not have any cough except during episodes of bronchitis. She does not have any wheezing, sputum production, hemoptysis.  She had PFTs in 2014 which is read as mild COPD with restrictive component. I do not have the actual study to review. She has been tried on [employer] during her episodes of bronchitis but is not on a long-standing inhaler medication.   She is a never smoker but been exposed to secondhand smoke as a child. She occasionally drinks alcohol. No illegal drug use. There is no family history of lung disease. 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 She does not have any exposures at work or at home.  Past Medical History  Diagnosis Date  . Diabetes mellitus   . Anemia   . DVT (deep venous thrombosis) ([place])   . GERD (gastroesophageal reflux disease)   . Rapid heartbeat   . Hypertension     dr Marlou Porch  . Arthritis     Current outpatient prescriptions:  .  B Complex-C (SUPER B COMPLEX PO), Take 1 tablet by mouth daily. Plus vitamin c, Disp: , Rfl:  .  Biotin 5000 MCG TABS, Take 5,000 mg by mouth daily. , Disp: , Rfl:  .  calcium carbonate (OS-CAL) 600 MG TABS, Take 600 mg by mouth daily., Disp: , Rfl:  .  Cholecalciferol (VITAMIN D) 2000 UNITS tablet, Take 2,000 Units by mouth daily., Disp: , Rfl:  .  Co-Enzyme Q10 200 MG CAPS, Take 200 mg by mouth every evening., Disp: , Rfl:  .  DULoxetine (CYMBALTA) 20 MG capsule, ,  Disp: , Rfl:  .  estradiol (ESTRACE) 1 MG tablet, Take 1 mg by mouth daily., Disp: , Rfl:  .  fish oil-omega-3 fatty acids 1000 MG capsule, Take 1 g by mouth 2 (two) times daily. , Disp: , Rfl:  .  glipiZIDE-metformin (METAGLIP) 5-500 MG per tablet, Take 2 tablets by mouth 2 (two) times daily before a meal., Disp: , Rfl:  .  HYDROcodone-acetaminophen (NORCO) 7.5-325 MG per tablet, , Disp: , Rfl:  .  insulin detemir (LEVEMIR) 100 unit/ml SOLN, Inject 50 Units into the skin daily. , Disp: , Rfl:  .  Insulin Pen Needle (BD PEN NEEDLE NANO U/F) 32G X 4 MM MISC, , Disp: , Rfl:  .  losartan (COZAAR) 100 MG tablet, Take 100 mg by mouth every evening. , Disp: , Rfl:  .  Magnesium 500 MG TABS, Take 500 mg by mouth at bedtime. , Disp: , Rfl:  .  metoprolol succinate (TOPROL-XL) 100 MG 24 hr tablet, Take 1 tablet (100 mg total) by mouth daily. Take with or immediately following a meal., Disp: , Rfl:  .  Multiple Vitamins-Minerals (MULTIVITAMIN WITH MINERALS) tablet, Take 1 tablet by mouth daily., Disp: , Rfl:  .  niacinamide 500 MG tablet, Take 500 mg by mouth daily., Disp: , Rfl:  .  rosuvastatin (CRESTOR) 10 MG tablet, Take 10 mg by mouth every evening. , Disp: , Rfl:  .  MAGNESIUM PO, 2 tablets with a meal, Disp: , Rfl:   Review of Systems Dyspnea on exertion denies any cough, sputum production, wheezing, hemoptysis. No chest pain, palpitations. No nausea, vomiting, diarrhea, constipation. No fevers, chills, loss of appetite, weight, malaise, fatigue. All other review of systems are negative.    Objective:   Physical Exam  Blood pressure 140/82, pulse 101, height 5\' 6"  (1.676 m), weight 206 lb 3.2 oz (93.532 kg), SpO2 97 %. Gen: No apparent distress Neuro: No gross focal deficits. Neck: No JVD, lymphadenopathy, thyromegaly. RS: Clear, no wheeze, crackles CVS: S1-S2 heard, no murmurs rubs gallops. Abdomen: Soft, positive bowel sounds. Extremities: No edema.    Assessment & Plan:  Dyspnea on  exertion. Recurrent bronchitis Mild COPD.  She has mild COPD by PFTs in 2014. Although she is a nonsmoker she had been exposed to significant amounts of secondhand smoke in the past. She is usually asymptomatic except for episodes of recurrent bronchitis. She is not on a long-term inhaler medication. I'll get a set of lung function tests to get a recent assessment of her lung function. If markedly abnormal with can consider putting her on a long-term medication such as Spiriva. However for now we'll continue to observe her off any inhalers.  Plan: - Pulmonary function tests.  Return to clinic in 2-3 months.  Marshell Garfinkel MD [facility] Pager [phone] If no answer or after 3pm call: [phone] 04/24/2015, 4:45 PM

## 2015-04-24 NOTE — Patient Instructions (Signed)
We'll schedule you for lung function tests.   Return to clinic in 2-3 months.

## 2015-05-14 ENCOUNTER — Encounter: Payer: Self-pay | Admitting: Cardiology

## 2015-05-14 ENCOUNTER — Ambulatory Visit (INDEPENDENT_AMBULATORY_CARE_PROVIDER_SITE_OTHER): Payer: PPO | Admitting: Cardiology

## 2015-05-14 VITALS — BP 132/76 | HR 73 | Ht 66.0 in | Wt 200.0 lb

## 2015-05-14 DIAGNOSIS — E669 Obesity, unspecified: Secondary | ICD-10-CM | POA: Diagnosis not present

## 2015-05-14 DIAGNOSIS — R002 Palpitations: Secondary | ICD-10-CM | POA: Diagnosis not present

## 2015-05-14 DIAGNOSIS — R0600 Dyspnea, unspecified: Secondary | ICD-10-CM

## 2015-05-14 DIAGNOSIS — I10 Essential (primary) hypertension: Secondary | ICD-10-CM

## 2015-05-14 MED ORDER — METOPROLOL SUCCINATE ER 100 MG PO TB24: ORAL_TABLET | ORAL | Status: DC

## 2015-05-14 NOTE — Patient Instructions (Signed)
Medication Instructions:  1) Take 1/2 tab (50mg ) of your Metoprolol Succinate in the morning and 1/2 tab (50mg ) in the evening.  Labwork: None  Testing/Procedures: None  Follow-Up: Your physician recommends that you schedule a follow-up appointment in: 3 months with [name].   Any Other Special Instructions Will Be Listed Below (If Applicable).     If you need a refill on your cardiac medications before your next appointment, please call your pharmacy.

## 2015-05-14 NOTE — Progress Notes (Signed)
[facility]. [address], [place]  [address] Phone: [phone] Fax:  [phone]  Date:  05/14/2015   ID:  [name], DOB Aug 22, 1940, MRN [id number]  PCP:  Henrine Screws, MD   History of Present Illness: [name] is a 75 y.o. female for follow up of dyspnea on exertion as well as evaluation of episodes of high heart rate at nighttime. This, dyspnea has been  ever since she had her knee surgery she states. Chest x-ray was unremarkable. Dr. Inda Merlin walked her around the office and she became quite short of breath but her oxygen saturations maintained at 98%. No wheezes, no obvious chest pain. She has not been having any claudication. While doing water aerobics for approximately an hour she feels fine.  Cardiac risk factors of diabetes and history of DVT in 2003. Her father had a heart attack in his 71s. Her hemoglobin was 12.4.    - 2012 and 2014, she underwent a nuclear stress test which was low risk, no ischemia.   - She's also previously undergone CT scan to rule out pulmonary embolism which was negative.   - Echocardiogram performed on 12/06/12 was overall reassuring. Normal EF.   Diltiazem DC'd in the past. Lost 20 pounds. Great.   Has had heart rate spiking up in middle of night. Feels like heart is going to explode. Has not had sleep study. Shoots up to 115-120 without provocation. Always hot, head sweats.   Wt Readings from Last 3 Encounters:  05/14/15 200 lb (90.719 kg)  04/24/15 206 lb 3.2 oz (93.532 kg)  01/19/14 192 lb (87.091 kg)     Past Medical History  Diagnosis Date  . Diabetes mellitus   . Anemia   . DVT (deep venous thrombosis) ([place])   . GERD (gastroesophageal reflux disease)   . Rapid heartbeat   . Hypertension     dr Marlou Porch  . Arthritis     Past Surgical History  Procedure Laterality Date  . Carpal tunnel release Right 2014  . Joint replacement Right 2012    knee  . Arthoscopic rotaor cuff repair Left 2010 or 2011    . Back surgery  x 2 1999 and 2008    lower back L 4 to L 5 L5 to S 1  . Ovarian wedge resection  1966  . Abdominal hysterectomy  1981  . Tonsillectomy  1960  . Colonoscopy with propofol N/A 10/25/2012    Procedure: COLONOSCOPY WITH PROPOFOL;  Surgeon: [name], MD;  Location: [facility];  Service: Endoscopy;  Laterality: N/A;  . Spinal cord stimulator insertion N/A 11/03/2012    Procedure: LUMBAR SPINAL CORD STIMULATOR INSERTION;  Surgeon: [name], MD;  Location: [facility];  Service: Orthopedics;  Laterality: N/A;  . Appendectomy      Current Outpatient Prescriptions  Medication Sig Dispense Refill  . B Complex-C (SUPER B COMPLEX PO) Take 1 tablet by mouth daily. Plus vitamin c    . Biotin 5000 MCG TABS Take 5,000 mg by mouth daily.     . calcium carbonate (OS-CAL) 600 MG TABS Take 600 mg by mouth daily.    . Cholecalciferol (VITAMIN D) 2000 UNITS tablet Take 2,000 Units by mouth daily.    Marland Kitchen Co-Enzyme Q10 200 MG CAPS Take 200 mg by mouth every evening.    . DULoxetine (CYMBALTA) 20 MG capsule     . estradiol (ESTRACE) 1 MG tablet Take 1 mg by mouth daily.    Marland Kitchen  fish oil-omega-3 fatty acids 1000 MG capsule Take 1 g by mouth 2 (two) times daily.     Marland Kitchen glipiZIDE (GLUCOTROL) 5 MG tablet Take by mouth 2 (two) times daily before a meal.    . HYDROcodone-acetaminophen (NORCO) 7.5-325 MG per tablet     . insulin detemir (LEVEMIR) 100 unit/ml SOLN Inject 50 Units into the skin daily.     . Insulin Pen Needle (BD PEN NEEDLE NANO U/F) 32G X 4 MM MISC     . losartan (COZAAR) 100 MG tablet Take 100 mg by mouth every evening.     . Magnesium 500 MG TABS Take 500 mg by mouth at bedtime.     Marland Kitchen MAGNESIUM PO 2 tablets with a meal    . metFORMIN (GLUCOPHAGE) 1000 MG tablet Take 1,000 mg by mouth 2 (two) times daily with a meal.    . metoprolol succinate (TOPROL-XL) 100 MG 24 hr tablet Take 1 tablet (100 mg total) by mouth daily. Take with or immediately following a meal.    . Multiple  Vitamins-Minerals (MULTIVITAMIN WITH MINERALS) tablet Take 1 tablet by mouth daily.    . niacinamide 500 MG tablet Take 500 mg by mouth daily.    . rosuvastatin (CRESTOR) 10 MG tablet Take 10 mg by mouth every evening.      No current facility-administered medications for this visit.    Allergies:    Allergies  Allergen Reactions  . Hydrochlorothiazide Anaphylaxis    low bp  . Ace Inhibitors     Other reaction(s): cough  . Antihistamines, Chlorpheniramine-Type     Other reaction(s): hyper  . Ezetimibe-Simvastatin     Other reaction(s): myalgia  . Indomethacin     Other reaction(s): peptic ulcers  . Lyrica [Pregabalin] Other (See Comments)    Weight gain, drowsy  . Prednisone Other (See Comments)  . Simvastatin     Other reaction(s): Elevated LFT's  . Troglitazone Other (See Comments)  . Vioxx [Rofecoxib]     Other reaction(s): anxiety & chills    Social History:  The patient  reports that she has never smoked. She has never used smokeless tobacco. She reports that she does not drink alcohol or use illicit drugs.   Family History  Problem Relation Age of Onset  . Heart disease Father   . Diabetes Mother     ROS:  Please see the history of present illness.   Denies any fevers, chills, orthopnea, PND.   All other systems reviewed and negative.   PHYSICAL EXAM: VS:  BP 132/76 mmHg  Pulse 73  Ht 5\' 6"  (1.676 m)  Wt 200 lb (90.719 kg)  BMI 32.30 kg/m2 Well nourished, well developed, in no acute distress HEENT: normal, [place]/AT, EOMI Neck: no JVD, normal carotid upstroke, no bruit Cardiac:  normal S1, S2; RRR; no murmur Lungs:  clear to auscultation bilaterally, no wheezing, rhonchi or rales Abd: soft, nontender, no hepatomegaly, no bruits Ext: no edema, 2+ distal pulses Skin: warm and dry GU: deferred Neuro: no focal abnormalities noted, AAO x 3  EKG:  Today 05/14/15-sinus rhythm, 73, poor R-wave progression, septal infarct pattern personally viewed-no change from  prior-previous 01/19/14-sinus rhythm, septal infarct pattern     ASSESSMENT AND PLAN:  1. Elevated heart rate at night/palpitations - she has had an event monitor in the past which was unremarkable. At this time, we will split her Toprol and take half in the morning, half in the evening. She states that during the daytime when she  takes her metoprolol in the morning, she does not have any of the symptoms. She has not had a previous sleep study. Could consider in the future. 2. Dyspnea-improved. She has lost approximately 20 pounds in the past.  3. Hyperlipidemia-Crestor. Dr. Inda Merlin will continue to manage. 4. Hypertension-currently well controlled. Excellent job with weight loss. 5. Obesity-excellent job with weight loss. Water aerobics. Reassuringly, she is not having any high risk symptoms during her water aerobics. 6. Three-month follow-up to see if heart rate is improved after splitting medication. If not, we consider repeating event monitor to ensure that she does not have any adverse arrhythmias.  Signed, [name], MD [facility]  05/14/2015 8:54 AM

## 2015-05-17 DIAGNOSIS — M961 Postlaminectomy syndrome, not elsewhere classified: Secondary | ICD-10-CM | POA: Diagnosis not present

## 2015-05-17 DIAGNOSIS — G629 Polyneuropathy, unspecified: Secondary | ICD-10-CM | POA: Diagnosis not present

## 2015-05-17 DIAGNOSIS — Z462 Encounter for fitting and adjustment of other devices related to nervous system and special senses: Secondary | ICD-10-CM | POA: Diagnosis not present

## 2015-05-20 ENCOUNTER — Telehealth: Payer: Self-pay | Admitting: Cardiology

## 2015-05-20 ENCOUNTER — Other Ambulatory Visit: Payer: Self-pay | Admitting: Cardiology

## 2015-05-20 MED ORDER — METOPROLOL SUCCINATE ER 50 MG PO TB24: 50.0000 mg | ORAL_TABLET | Freq: Two times a day (BID) | ORAL | Status: AC

## 2015-05-20 NOTE — Telephone Encounter (Signed)
Left message for [name] at [facility] that RX is being sent electronically.  Requested she call back with further questions.

## 2015-05-20 NOTE — Telephone Encounter (Signed)
New message       *STAT* If patient is at the pharmacy, call can be transferred to refill team.   1. Which medications need to be refilled? (please list name of each medication and dose if known) metoprolol succ  100mg --1/2 tablet in am and 1/2 tablet in pm 2. Which pharmacy/location (including street and city if local pharmacy) is medication to be sent to? [facility]  3. Do they need a 30 day or 90 day supply? 90 day Pt want a presc for 50mg  so that she will not have to cut the pill in half.  She will take 1 tablet in am and 1 tablet in pm

## 2015-06-17 DIAGNOSIS — Z4542 Encounter for adjustment and management of neuropacemaker (brain) (peripheral nerve) (spinal cord): Secondary | ICD-10-CM | POA: Diagnosis not present

## 2015-06-17 DIAGNOSIS — M5442 Lumbago with sciatica, left side: Secondary | ICD-10-CM | POA: Diagnosis not present

## 2015-06-17 DIAGNOSIS — M961 Postlaminectomy syndrome, not elsewhere classified: Secondary | ICD-10-CM | POA: Diagnosis not present

## 2015-06-17 DIAGNOSIS — G8929 Other chronic pain: Secondary | ICD-10-CM | POA: Diagnosis not present

## 2015-06-17 DIAGNOSIS — M5136 Other intervertebral disc degeneration, lumbar region: Secondary | ICD-10-CM | POA: Diagnosis not present

## 2015-06-27 ENCOUNTER — Ambulatory Visit (INDEPENDENT_AMBULATORY_CARE_PROVIDER_SITE_OTHER): Payer: PPO | Admitting: Pulmonary Disease

## 2015-06-27 ENCOUNTER — Ambulatory Visit (INDEPENDENT_AMBULATORY_CARE_PROVIDER_SITE_OTHER)
Admission: RE | Admit: 2015-06-27 | Discharge: 2015-06-27 | Disposition: A | Discharge status: Home / Self Care | Payer: PPO | Source: Ambulatory Visit | Attending: Pulmonary Disease | Admitting: Pulmonary Disease

## 2015-06-27 ENCOUNTER — Encounter: Payer: Self-pay | Admitting: Pulmonary Disease

## 2015-06-27 VITALS — BP 112/78 | HR 78 | Ht 65.0 in | Wt 213.0 lb

## 2015-06-27 DIAGNOSIS — R0600 Dyspnea, unspecified: Secondary | ICD-10-CM

## 2015-06-27 DIAGNOSIS — R0602 Shortness of breath: Secondary | ICD-10-CM | POA: Diagnosis not present

## 2015-06-27 LAB — PULMONARY FUNCTION TEST
DL/VA % pred: 89 %
DL/VA: 4.39 ml/min/mmHg/L
DLCO cor % pred: 66 %
DLCO cor: 16.91 ml/min/mmHg
DLCO unc % pred: 65 %
DLCO unc: 16.75 ml/min/mmHg
FEF 25-75 Post: 1.55 L/sec
FEF 25-75 Pre: 1.54 L/sec
FEF2575-%Change-Post: 0 %
FEF2575-%Pred-Post: 86 %
FEF2575-%Pred-Pre: 86 %
FEV1-%Change-Post: 0 %
FEV1-%Pred-Post: 83 %
FEV1-%Pred-Pre: 83 %
FEV1-Post: 1.87 L
FEV1-Pre: 1.89 L
FEV1FVC-%Change-Post: 4 %
FEV1FVC-%Pred-Pre: 104 %
FEV6-%Change-Post: -5 %
FEV6-%Pred-Post: 80 %
FEV6-%Pred-Pre: 84 %
FEV6-Post: 2.28 L
FEV6-Pre: 2.41 L
FEV6FVC-%Pred-Post: 105 %
FEV6FVC-%Pred-Pre: 105 %
FVC-%Change-Post: -5 %
FVC-%Pred-Post: 76 %
FVC-%Pred-Pre: 80 %
FVC-Post: 2.28 L
FVC-Pre: 2.41 L
Post FEV1/FVC ratio: 82 %
Post FEV6/FVC ratio: 100 %
Pre FEV1/FVC ratio: 78 %
Pre FEV6/FVC Ratio: 100 %
RV % pred: 71 %
RV: 1.64 L
TLC % pred: 83 %
TLC: 4.33 L

## 2015-06-27 MED ORDER — MOMETASONE FURO-FORMOTEROL FUM 100-5 MCG/ACT IN AERO: 2.0000 | INHALATION_SPRAY | Freq: Two times a day (BID) | RESPIRATORY_TRACT | Status: DC

## 2015-06-27 NOTE — Progress Notes (Addendum)
Subjective:    Patient ID: [name], female    DOB: 1940-11-15, 75 y.o.   MRN: [id number]  HPI Follow up for evaluation of cough, dyspnea  Mrs. Hibbitts is a 75 year old with past medical history as below. She complains of worsening dyspnea on exertion for the past 3 and half to 4 years. She has multiple episodes  of bronchitis usually 2-3 times a year which is treated with antibiotics, prednisone. She had her last attack a few months. Her main complaints are dyspnea on exertion. She however maintains an active lifestyle and does aerobics for approximately an hour. She does not have any cough except during episodes of bronchitis. She does not have any wheezing, sputum production, hemoptysis. She has been tried on [employer] during her episodes of bronchitis but is not on a long-standing inhaler medication.   DATA: PFTs 06/27/15 FVC 2.41 (80%) FEV1 1.89 (83%) F/F 78 TLC 83% DLCO 65%. Very minimal obstruction, reduction in diffusion capacity.  Nuclear stress test 2012, 2014-low risk, no ischemia  CTA 12/12/10 No PE, clear lungs.  Echocardiogram 2014 - normal EF.  Social History: She is a never smoker but been exposed to secondhand smoke as a child. She occasionally drinks alcohol. No illegal drug use. There is no family history of lung disease. 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 She does not have any exposures at work or at home.  Family History: Father- heart disease  Past Medical History  Diagnosis Date  . Diabetes mellitus   . Anemia   . DVT (deep venous thrombosis) ([place])   . GERD (gastroesophageal reflux disease)   . Rapid heartbeat   . Hypertension     dr Marlou Porch  . Arthritis     Current outpatient prescriptions:  .  B Complex-C (SUPER B COMPLEX PO), Take 1 tablet by mouth daily. Plus vitamin c, Disp: , Rfl:  .  Biotin 5000 MCG TABS, Take 5,000 mg by mouth daily. , Disp: , Rfl:  .  calcium carbonate (OS-CAL) 600 MG TABS, Take 600 mg  by mouth daily., Disp: , Rfl:  .  Cholecalciferol (VITAMIN D) 2000 UNITS tablet, Take 2,000 Units by mouth daily., Disp: , Rfl:  .  Co-Enzyme Q10 200 MG CAPS, Take 200 mg by mouth every evening., Disp: , Rfl:  .  DULoxetine (CYMBALTA) 20 MG capsule, , Disp: , Rfl:  .  estradiol (ESTRACE) 1 MG tablet, Take 1 mg by mouth daily., Disp: , Rfl:  .  fish oil-omega-3 fatty acids 1000 MG capsule, Take 1 g by mouth 2 (two) times daily. , Disp: , Rfl:  .  glipiZIDE (GLUCOTROL) 5 MG tablet, Take by mouth 2 (two) times daily before a meal., Disp: , Rfl:  .  HYDROcodone-acetaminophen (NORCO) 7.5-325 MG per tablet, , Disp: , Rfl:  .  insulin detemir (LEVEMIR) 100 unit/ml SOLN, Inject 50 Units into the skin daily. , Disp: , Rfl:  .  Insulin Pen Needle (BD PEN NEEDLE NANO U/F) 32G X 4 MM MISC, , Disp: , Rfl:  .  losartan (COZAAR) 100 MG tablet, Take 100 mg by mouth every evening. , Disp: , Rfl:  .  Magnesium 500 MG TABS, Take 500 mg by mouth at bedtime. , Disp: , Rfl:  .  MAGNESIUM PO, 2 tablets with a meal, Disp: , Rfl:  .  metFORMIN (GLUCOPHAGE) 1000 MG tablet, Take 1,000 mg by mouth 2 (two) times daily with a meal., Disp: ,  Rfl:  .  metoprolol succinate (TOPROL-XL) 50 MG 24 hr tablet, Take 1 tablet (50 mg total) by mouth 2 (two) times daily., Disp: 180 tablet, Rfl: 3 .  Multiple Vitamins-Minerals (MULTIVITAMIN WITH MINERALS) tablet, Take 1 tablet by mouth daily., Disp: , Rfl:  .  niacinamide 500 MG tablet, Take 500 mg by mouth daily., Disp: , Rfl:  .  rosuvastatin (CRESTOR) 10 MG tablet, Take 10 mg by mouth every evening. , Disp: , Rfl:   Review of Systems Dyspnea on exertion denies any cough, sputum production, wheezing, hemoptysis. No chest pain, palpitations. No nausea, vomiting, diarrhea, constipation. No fevers, chills, loss of appetite, weight, malaise, fatigue. All other review of systems are negative.    Objective:   Physical Exam Blood pressure 112/78, pulse 78, height 5\' 5"  (1.651 m), weight  213 lb (96.616 kg), SpO2 93 %. Gen: No apparent distress Neuro: No gross focal deficits. Neck: No JVD, lymphadenopathy, thyromegaly. RS: Clear, no wheeze, crackles CVS: S1-S2 heard, no murmurs rubs gallops. Abdomen: Soft, positive bowel sounds. Extremities: No edema.    Assessment & Plan:  Dyspnea on exertion. Recurrent bronchitis  PFTs reviewed today. Although there is no overt reduction in F/F ratio there is scooping out of expiratory loop suggestive of mild obstruction. Her DLCO is reduced obut corrects for alveolar volume. Although she is a nonsmoker she had been exposed to significant amounts of secondhand smoke in the past.  The etiology of her dyspnea on exertion is unclear as there does not seem to be any major pulmonary issue here. I suspect this is secondary to deconditioning. She had been evaluated by cardiology with normal stress test and echo in the past. I'll start her on Dulera inhaler and send her for a cardiac pulmonary exercise test.  Plan: - Dulera inhaler - CPET  Return to clinic in 3 months.  Marshell Garfinkel MD [facility] Pager [phone] If no answer or after 3pm call: [phone] 06/27/2015, 4:36 PM

## 2015-06-27 NOTE — Progress Notes (Signed)
PFT done today. 

## 2015-06-27 NOTE — Addendum Note (Signed)
Addended by: Raymondo Band D on: 06/27/2015 04:43 PM   Modules accepted: Orders

## 2015-06-27 NOTE — Patient Instructions (Signed)
We will get a CXR today and schedule you for a cardiopulmonary exercise test.  Start Dulera inhaler 100/5  Return to clinic in 3 months.

## 2015-06-28 NOTE — Progress Notes (Signed)
Quick Note:  Called spoke with patient, advised of cxr results / recs as stated by PM. Pt verbalized her understanding and denied any questions. ______

## 2015-07-02 ENCOUNTER — Telehealth: Payer: Self-pay | Admitting: Pulmonary Disease

## 2015-07-02 MED ORDER — MOMETASONE FURO-FORMOTEROL FUM 100-5 MCG/ACT IN AERO: 2.0000 | INHALATION_SPRAY | Freq: Two times a day (BID) | RESPIRATORY_TRACT | Status: DC

## 2015-07-02 NOTE — Telephone Encounter (Signed)
Called [facility]. They received RX for dulera for 30 day supply and is needing VO for 90 day supply. I gave VO to the pharmacists. Nothing further needed

## 2015-07-05 ENCOUNTER — Encounter (HOSPITAL_COMMUNITY): Payer: PPO

## 2015-07-08 DIAGNOSIS — G894 Chronic pain syndrome: Secondary | ICD-10-CM | POA: Diagnosis not present

## 2015-07-08 DIAGNOSIS — M961 Postlaminectomy syndrome, not elsewhere classified: Secondary | ICD-10-CM | POA: Diagnosis not present

## 2015-07-08 DIAGNOSIS — Z79891 Long term (current) use of opiate analgesic: Secondary | ICD-10-CM | POA: Diagnosis not present

## 2015-07-11 ENCOUNTER — Ambulatory Visit (HOSPITAL_COMMUNITY): Payer: PPO | Attending: Pulmonary Disease

## 2015-07-11 DIAGNOSIS — R0600 Dyspnea, unspecified: Secondary | ICD-10-CM | POA: Diagnosis not present

## 2015-07-12 ENCOUNTER — Telehealth: Payer: Self-pay | Admitting: Pulmonary Disease

## 2015-07-12 NOTE — Telephone Encounter (Signed)
Attempted to contact [facility]. Received a recording to leave a message. lmtcb x1.

## 2015-07-16 NOTE — Telephone Encounter (Signed)
Attempted to contact [facility]. Left message to call back.

## 2015-07-17 NOTE — Telephone Encounter (Signed)
Spoke with Benjamine Mola at [facility] - states that PA was already initiated and denied for [facility]. I advised that our office has yet to initiate a PA for this medication. Benjamine Mola states that the PA was submitted by the patient 07/03/15 and the PA was initiated at this point. States that several faxes were sent to our office to complete PA and due to lack of communication from our office the PA was denied on 07/13/15. I advised that we were unaware that a PA was needed nor did we ever receive any faxes and that this was the first attempt from our office to initiate a PA. Benjamine Mola states that at this point the medication has been denied and we would have to do an Appeal for coverage. Denial letter is being faxed to our office with appeal information. Dr Vaughan Browner, please advise if you wish to do the appeal or change to an alternative medication. Thanks.

## 2015-07-17 NOTE — Telephone Encounter (Signed)
I am ok with whatever the insurance approves. Either dulera, advair or symbicort.

## 2015-07-17 NOTE — Telephone Encounter (Signed)
LVM for for [facility] to return our call.

## 2015-07-18 MED ORDER — BUDESONIDE-FORMOTEROL FUMARATE 80-4.5 MCG/ACT IN AERO: 2.0000 | INHALATION_SPRAY | Freq: Two times a day (BID) | RESPIRATORY_TRACT | Status: DC

## 2015-07-18 NOTE — Telephone Encounter (Signed)
I have received the Denial Letter from [facility] dated 3.4.17 On the letter it states that covered alternatives are: Advair Diskus and HFA, Breo Ellipta, Combivent, Duoneb Solution, Stiolto Respimat and Symbicort.  Dr Vaughan Browner since Advair and Symbicort are covered, which would you prefer to change the patient to?  Thanks.

## 2015-07-18 NOTE — Telephone Encounter (Signed)
LMTCB

## 2015-07-18 NOTE — Telephone Encounter (Signed)
Spoke with the pt and notified of recs per PM  Pt verbalized understanding  Rx was sent to pharm  Nothing further needed

## 2015-07-18 NOTE — Telephone Encounter (Signed)
Yes. Symbicort 80 is OK

## 2015-07-18 NOTE — Telephone Encounter (Signed)
Called and spoke with [name]. She states that he Ruthe Mannan was denied. She states that symbicort 80 is not covered but they offer a coupon that will make the medication cost $45.00 for a year. I explained to her that I will need to send a message to PM to make sure Symbicort 80 is appropriate. She voiced understanding and had no further questions.   PM please advise if symbicort 80 is okay

## 2015-07-23 DIAGNOSIS — M5416 Radiculopathy, lumbar region: Secondary | ICD-10-CM | POA: Diagnosis not present

## 2015-07-27 ENCOUNTER — Telehealth: Payer: Self-pay | Admitting: Pulmonary Disease

## 2015-07-27 NOTE — Telephone Encounter (Signed)
Called patient. Recently started on Dulera. Reports a white film on the inside of her mouth. Patient reports she is doing appropriate oral hygiene. Instructed her to stop the [facility] for now. I will send this note to Dr. Vaughan Browner for his review and adjustment of her inhaler. I am phoning in a Rx for Nystatin swish & swallow & instructed her to use it until the thrush is gone.  Pharmacy was [facility] [phone].

## 2015-07-29 ENCOUNTER — Encounter: Payer: Self-pay | Admitting: *Deleted

## 2015-07-29 ENCOUNTER — Telehealth: Payer: Self-pay | Admitting: Pulmonary Disease

## 2015-07-29 NOTE — Telephone Encounter (Signed)
Call Documentation      Javier Glazier, MD at 07/27/2015 4:36 PM     Status: Signed       Expand All Collapse All   Called patient. Recently started on Dulera. Reports a white film on the inside of her mouth. Patient reports she is doing appropriate oral hygiene. Instructed her to stop the [facility] for now. I will send this note to Dr. Vaughan Browner for his review and adjustment of her inhaler. I am phoning in a Rx for Nystatin swish & swallow & instructed her to use it until the thrush is gone. Pharmacy was [facility] [phone].       Spoke with pt and she states she has gotten Nystatin that was called in by [facility] Saturday. Pt states it is helping some. Pt states that she did not make call to office this morning.  No further questions or concerns.

## 2015-07-30 NOTE — Telephone Encounter (Signed)
Spoke with pt, states her s/s have improved and are improving daily, but thrush is still present. Pt is still using Nystatin as prescribed.  Denies any complaints with nystatin.  I advised pt to contact our office if the thrush dose not go away, or if her s/s worsen.  Pt expressed understanding.  Nothing further needed, forwarding to [facility] as FYI.

## 2015-07-30 NOTE — Telephone Encounter (Signed)
Reviewed the schedule and noted that Dr. Vaughan Browner is currently on vacation. Please call patient and inquire as to how she is doing and if she is having any difficulty breathing this should be addressed by the Doctor of the Day or may be able to wait for Dr. Matilde Bash return to work. Thanks.

## 2015-07-30 NOTE — Telephone Encounter (Signed)
LMTCB and will hold in triage as Dr Ashok Cordia created the [facility]

## 2015-07-30 NOTE — Telephone Encounter (Signed)
[phone], pt cb

## 2015-08-01 ENCOUNTER — Ambulatory Visit: Payer: PPO | Admitting: Cardiology

## 2015-08-01 DIAGNOSIS — Z79899 Other long term (current) drug therapy: Secondary | ICD-10-CM | POA: Diagnosis not present

## 2015-08-01 DIAGNOSIS — K140 Glossitis: Secondary | ICD-10-CM | POA: Diagnosis not present

## 2015-08-01 DIAGNOSIS — M545 Low back pain: Secondary | ICD-10-CM | POA: Diagnosis not present

## 2015-08-01 DIAGNOSIS — I10 Essential (primary) hypertension: Secondary | ICD-10-CM | POA: Diagnosis not present

## 2015-08-01 DIAGNOSIS — D509 Iron deficiency anemia, unspecified: Secondary | ICD-10-CM | POA: Diagnosis not present

## 2015-08-01 DIAGNOSIS — Z0001 Encounter for general adult medical examination with abnormal findings: Secondary | ICD-10-CM | POA: Diagnosis not present

## 2015-08-01 DIAGNOSIS — R61 Generalized hyperhidrosis: Secondary | ICD-10-CM | POA: Diagnosis not present

## 2015-08-01 DIAGNOSIS — E782 Mixed hyperlipidemia: Secondary | ICD-10-CM | POA: Diagnosis not present

## 2015-08-01 DIAGNOSIS — E1140 Type 2 diabetes mellitus with diabetic neuropathy, unspecified: Secondary | ICD-10-CM | POA: Diagnosis not present

## 2015-08-01 DIAGNOSIS — Z1389 Encounter for screening for other disorder: Secondary | ICD-10-CM | POA: Diagnosis not present

## 2015-08-01 DIAGNOSIS — Z794 Long term (current) use of insulin: Secondary | ICD-10-CM | POA: Diagnosis not present

## 2015-08-01 DIAGNOSIS — M609 Myositis, unspecified: Secondary | ICD-10-CM | POA: Diagnosis not present

## 2015-08-05 DIAGNOSIS — R0600 Dyspnea, unspecified: Secondary | ICD-10-CM | POA: Diagnosis not present

## 2015-09-16 ENCOUNTER — Ambulatory Visit (INDEPENDENT_AMBULATORY_CARE_PROVIDER_SITE_OTHER): Payer: PPO | Admitting: Pulmonary Disease

## 2015-09-16 ENCOUNTER — Encounter: Payer: Self-pay | Admitting: Pulmonary Disease

## 2015-09-16 VITALS — BP 122/72 | HR 82 | Ht 66.0 in

## 2015-09-16 DIAGNOSIS — R0600 Dyspnea, unspecified: Secondary | ICD-10-CM | POA: Diagnosis not present

## 2015-09-16 NOTE — Progress Notes (Signed)
Subjective:    Patient ID: [name], female    DOB: 11-26-1940, 75 y.o.   MRN: [id number]  HPI Follow up for evaluation of cough, dyspnea  [name] is a 75 year old with past medical history as below. She complains of worsening dyspnea on exertion for the past 3.5 to 4 years. She has multiple episodes  of bronchitis usually 2-3 times a year which is treated with antibiotics, prednisone. She had her last attack a few months. Her main complaints are dyspnea on exertion. She however maintains an active lifestyle and does aerobics for approximately an hour. She does not have any cough except during episodes of bronchitis. She does not have any wheezing, sputum production, hemoptysis.   DATA: PFTs 06/27/15 FVC 2.41 (80%) FEV1 1.89 (83%) F/F 78 TLC 83% DLCO 65%. Very minimal obstruction, reduction in diffusion capacity.  Nuclear stress test 2012, 2014-low risk, no ischemia  CTA 12/12/10 No PE, clear lungs.  Echocardiogram 2014 - normal EF.  CPX 07/11/15 Exercise testing with gas exchange demonstrates normal functional capacity when compared to matched sedentary norms. There are no obvious ventilatory or circulatory limitations to exercise, however there was a hypertensive response to the exercise. At peak exercise, patient appears primarily limited by her body habitus.    Social History: She is a never smoker but been exposed to secondhand smoke as a child. She occasionally drinks alcohol. No illegal drug use. There is no family history of lung disease. 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 She does not have any exposures at work or at home.  Family History: Father- heart disease  Past Medical History  Diagnosis Date  . Diabetes mellitus   . Anemia   . DVT (deep venous thrombosis) ([place])   . GERD (gastroesophageal reflux disease)   . Rapid heartbeat   . Hypertension     dr Marlou Porch  . Arthritis     Current outpatient prescriptions:  .  B  Complex-C (SUPER B COMPLEX PO), Take 1 tablet by mouth daily. Plus vitamin c, Disp: , Rfl:  .  Biotin 5000 MCG TABS, Take 5,000 mg by mouth daily. , Disp: , Rfl:  .  budesonide-formoterol (SYMBICORT) 80-4.5 MCG/ACT inhaler, Inhale 2 puffs into the lungs 2 (two) times daily., Disp: 1 Inhaler, Rfl: 2 .  calcium carbonate (OS-CAL) 600 MG TABS, Take 600 mg by mouth daily., Disp: , Rfl:  .  Cholecalciferol (VITAMIN D) 2000 UNITS tablet, Take 2,000 Units by mouth daily., Disp: , Rfl:  .  Co-Enzyme Q10 200 MG CAPS, Take 200 mg by mouth every evening., Disp: , Rfl:  .  DULoxetine (CYMBALTA) 20 MG capsule, , Disp: , Rfl:  .  estradiol (ESTRACE) 1 MG tablet, Take 1 mg by mouth daily., Disp: , Rfl:  .  fish oil-omega-3 fatty acids 1000 MG capsule, Take 1 g by mouth 2 (two) times daily. , Disp: , Rfl:  .  glipiZIDE (GLUCOTROL) 5 MG tablet, Take 2 tablets twice daily, Disp: , Rfl:  .  HYDROcodone-acetaminophen (NORCO) 7.5-325 MG per tablet, , Disp: , Rfl:  .  Insulin Pen Needle (BD PEN NEEDLE NANO U/F) 32G X 4 MM MISC, , Disp: , Rfl:  .  losartan (COZAAR) 100 MG tablet, Take 100 mg by mouth every evening. , Disp: , Rfl:  .  Magnesium 500 MG TABS, Take 500 mg by mouth at bedtime. , Disp: , Rfl:  .  metFORMIN (GLUCOPHAGE) 1000 MG tablet, Take  1,000 mg by mouth 2 (two) times daily with a meal., Disp: , Rfl:  .  metoprolol succinate (TOPROL-XL) 50 MG 24 hr tablet, Take 1 tablet (50 mg total) by mouth 2 (two) times daily., Disp: 180 tablet, Rfl: 3 .  Multiple Vitamins-Minerals (MULTIVITAMIN WITH MINERALS) tablet, Take 1 tablet by mouth daily., Disp: , Rfl:  .  niacinamide 500 MG tablet, Take 500 mg by mouth daily., Disp: , Rfl:  .  rosuvastatin (CRESTOR) 10 MG tablet, Take 10 mg by mouth every evening. , Disp: , Rfl:   Review of Systems Dyspnea on exertion denies any cough, sputum production, wheezing, hemoptysis. No chest pain, palpitations. No nausea, vomiting, diarrhea, constipation. No fevers, chills, loss  of appetite, weight, malaise, fatigue. All other review of systems are negative.    Objective:   Physical Exam Blood pressure 122/72, pulse 82, height 5\' 6"  (1.676 m), SpO2 95 %. Gen: No apparent distress Neuro: No gross focal deficits. Neck: No JVD, lymphadenopathy, thyromegaly. RS: Clear, no wheeze, crackles CVS: S1-S2 heard, no murmurs rubs gallops. Abdomen: Soft, positive bowel sounds. Extremities: No edema.    Assessment & Plan:  Dyspnea on exertion.  All her tests were reviewed with her today. There is no obvious explanation for her dyspnea except for body habitus, deconditioning. There does not seem to be any major pulmonary issue here. There no overt reduction in F/F ratio on PFTs but there is scooping out of expiratory loop suggestive of minimal obstruction. Her DLCO is reduced but corrects for alveolar volume. Although she is a nonsmoker she had been exposed to significant amounts of secondhand smoke in the past. She had been evaluated by cardiology with normal stress test and echo in the past. CPX shows normal exercise capacity with no cardiac or pulmonary limitations.   We have tried her on dulera and symbicort but she developed thrush and has not noticed any difference in dyspnea. She will stop using the inhaler and will work on her weight loss and continuing the exercise regimen. I will give her albuterol rescue inhaler to be used as needed.   She will call back as needed for a reval if she has worsening of symptoms.    Plan: - Albuterol resuce inhaler - Return to clinic as needed.    Marshell Garfinkel MD [facility] Pager [phone] If no answer or after 3pm call: [phone] 09/16/2015, 12:48 PM

## 2015-09-16 NOTE — Patient Instructions (Signed)
We will give her an albuterol rescue inhaler. Stop using the Symbicort as it is not helping with his symptoms.  We'll not make follow-up appointment however please call us back as needed with any worsening symptoms.

## 2015-10-28 DIAGNOSIS — M62830 Muscle spasm of back: Secondary | ICD-10-CM | POA: Diagnosis not present

## 2015-10-28 DIAGNOSIS — M5441 Lumbago with sciatica, right side: Secondary | ICD-10-CM | POA: Diagnosis not present

## 2015-10-28 DIAGNOSIS — M9903 Segmental and somatic dysfunction of lumbar region: Secondary | ICD-10-CM | POA: Diagnosis not present

## 2015-10-28 DIAGNOSIS — M5442 Lumbago with sciatica, left side: Secondary | ICD-10-CM | POA: Diagnosis not present

## 2015-10-29 DIAGNOSIS — M25562 Pain in left knee: Secondary | ICD-10-CM | POA: Diagnosis not present

## 2015-10-29 DIAGNOSIS — S8982XA Other specified injuries of left lower leg, initial encounter: Secondary | ICD-10-CM | POA: Diagnosis not present

## 2015-10-30 DIAGNOSIS — M5442 Lumbago with sciatica, left side: Secondary | ICD-10-CM | POA: Diagnosis not present

## 2015-10-30 DIAGNOSIS — M62830 Muscle spasm of back: Secondary | ICD-10-CM | POA: Diagnosis not present

## 2015-10-30 DIAGNOSIS — M5441 Lumbago with sciatica, right side: Secondary | ICD-10-CM | POA: Diagnosis not present

## 2015-10-30 DIAGNOSIS — M9903 Segmental and somatic dysfunction of lumbar region: Secondary | ICD-10-CM | POA: Diagnosis not present

## 2015-10-31 DIAGNOSIS — M62830 Muscle spasm of back: Secondary | ICD-10-CM | POA: Diagnosis not present

## 2015-10-31 DIAGNOSIS — M5441 Lumbago with sciatica, right side: Secondary | ICD-10-CM | POA: Diagnosis not present

## 2015-10-31 DIAGNOSIS — M5442 Lumbago with sciatica, left side: Secondary | ICD-10-CM | POA: Diagnosis not present

## 2015-10-31 DIAGNOSIS — M9903 Segmental and somatic dysfunction of lumbar region: Secondary | ICD-10-CM | POA: Diagnosis not present

## 2015-11-04 DIAGNOSIS — M62830 Muscle spasm of back: Secondary | ICD-10-CM | POA: Diagnosis not present

## 2015-11-04 DIAGNOSIS — M5441 Lumbago with sciatica, right side: Secondary | ICD-10-CM | POA: Diagnosis not present

## 2015-11-04 DIAGNOSIS — M5442 Lumbago with sciatica, left side: Secondary | ICD-10-CM | POA: Diagnosis not present

## 2015-11-04 DIAGNOSIS — M9903 Segmental and somatic dysfunction of lumbar region: Secondary | ICD-10-CM | POA: Diagnosis not present

## 2015-11-06 DIAGNOSIS — M62830 Muscle spasm of back: Secondary | ICD-10-CM | POA: Diagnosis not present

## 2015-11-06 DIAGNOSIS — M5442 Lumbago with sciatica, left side: Secondary | ICD-10-CM | POA: Diagnosis not present

## 2015-11-06 DIAGNOSIS — M9903 Segmental and somatic dysfunction of lumbar region: Secondary | ICD-10-CM | POA: Diagnosis not present

## 2015-11-06 DIAGNOSIS — M5441 Lumbago with sciatica, right side: Secondary | ICD-10-CM | POA: Diagnosis not present

## 2015-11-08 DIAGNOSIS — G5602 Carpal tunnel syndrome, left upper limb: Secondary | ICD-10-CM | POA: Diagnosis not present

## 2015-11-08 DIAGNOSIS — G5601 Carpal tunnel syndrome, right upper limb: Secondary | ICD-10-CM | POA: Diagnosis not present

## 2015-11-11 DIAGNOSIS — M9903 Segmental and somatic dysfunction of lumbar region: Secondary | ICD-10-CM | POA: Diagnosis not present

## 2015-11-11 DIAGNOSIS — M5441 Lumbago with sciatica, right side: Secondary | ICD-10-CM | POA: Diagnosis not present

## 2015-11-11 DIAGNOSIS — M5442 Lumbago with sciatica, left side: Secondary | ICD-10-CM | POA: Diagnosis not present

## 2015-11-11 DIAGNOSIS — M62830 Muscle spasm of back: Secondary | ICD-10-CM | POA: Diagnosis not present

## 2015-11-14 DIAGNOSIS — M25562 Pain in left knee: Secondary | ICD-10-CM | POA: Diagnosis not present

## 2015-11-27 DIAGNOSIS — G5602 Carpal tunnel syndrome, left upper limb: Secondary | ICD-10-CM | POA: Diagnosis not present

## 2015-12-06 DIAGNOSIS — Z4789 Encounter for other orthopedic aftercare: Secondary | ICD-10-CM | POA: Diagnosis not present

## 2015-12-10 DIAGNOSIS — M5416 Radiculopathy, lumbar region: Secondary | ICD-10-CM | POA: Diagnosis not present

## 2015-12-12 DIAGNOSIS — M25562 Pain in left knee: Secondary | ICD-10-CM | POA: Diagnosis not present

## 2015-12-25 DIAGNOSIS — M5442 Lumbago with sciatica, left side: Secondary | ICD-10-CM | POA: Diagnosis not present

## 2015-12-25 DIAGNOSIS — M5441 Lumbago with sciatica, right side: Secondary | ICD-10-CM | POA: Diagnosis not present

## 2015-12-25 DIAGNOSIS — M9903 Segmental and somatic dysfunction of lumbar region: Secondary | ICD-10-CM | POA: Diagnosis not present

## 2015-12-25 DIAGNOSIS — M62830 Muscle spasm of back: Secondary | ICD-10-CM | POA: Diagnosis not present

## 2016-01-14 DIAGNOSIS — H3540 Unspecified peripheral retinal degeneration: Secondary | ICD-10-CM | POA: Diagnosis not present

## 2016-01-14 DIAGNOSIS — H52223 Regular astigmatism, bilateral: Secondary | ICD-10-CM | POA: Diagnosis not present

## 2016-01-14 DIAGNOSIS — H524 Presbyopia: Secondary | ICD-10-CM | POA: Diagnosis not present

## 2016-01-14 DIAGNOSIS — H5203 Hypermetropia, bilateral: Secondary | ICD-10-CM | POA: Diagnosis not present

## 2016-01-14 DIAGNOSIS — H1850 Unspecified hereditary corneal dystrophies: Secondary | ICD-10-CM | POA: Diagnosis not present

## 2016-01-14 DIAGNOSIS — H2513 Age-related nuclear cataract, bilateral: Secondary | ICD-10-CM | POA: Diagnosis not present

## 2016-01-14 DIAGNOSIS — H04203 Unspecified epiphora, bilateral lacrimal glands: Secondary | ICD-10-CM | POA: Diagnosis not present

## 2016-01-14 DIAGNOSIS — E119 Type 2 diabetes mellitus without complications: Secondary | ICD-10-CM | POA: Diagnosis not present

## 2016-01-14 DIAGNOSIS — Z7984 Long term (current) use of oral hypoglycemic drugs: Secondary | ICD-10-CM | POA: Diagnosis not present

## 2016-01-22 DIAGNOSIS — R2232 Localized swelling, mass and lump, left upper limb: Secondary | ICD-10-CM | POA: Diagnosis not present

## 2016-01-22 DIAGNOSIS — M67442 Ganglion, left hand: Secondary | ICD-10-CM | POA: Diagnosis not present

## 2016-01-22 DIAGNOSIS — M24042 Loose body in left finger joint(s): Secondary | ICD-10-CM | POA: Diagnosis not present

## 2016-02-06 DIAGNOSIS — I10 Essential (primary) hypertension: Secondary | ICD-10-CM | POA: Diagnosis not present

## 2016-02-06 DIAGNOSIS — G4700 Insomnia, unspecified: Secondary | ICD-10-CM | POA: Diagnosis not present

## 2016-02-06 DIAGNOSIS — Z23 Encounter for immunization: Secondary | ICD-10-CM | POA: Diagnosis not present

## 2016-02-06 DIAGNOSIS — J42 Unspecified chronic bronchitis: Secondary | ICD-10-CM | POA: Diagnosis not present

## 2016-02-06 DIAGNOSIS — Z79899 Other long term (current) drug therapy: Secondary | ICD-10-CM | POA: Diagnosis not present

## 2016-02-06 DIAGNOSIS — E1140 Type 2 diabetes mellitus with diabetic neuropathy, unspecified: Secondary | ICD-10-CM | POA: Diagnosis not present

## 2016-02-06 DIAGNOSIS — Z794 Long term (current) use of insulin: Secondary | ICD-10-CM | POA: Diagnosis not present

## 2016-02-06 DIAGNOSIS — M609 Myositis, unspecified: Secondary | ICD-10-CM | POA: Diagnosis not present

## 2016-02-06 DIAGNOSIS — E782 Mixed hyperlipidemia: Secondary | ICD-10-CM | POA: Diagnosis not present

## 2016-02-06 DIAGNOSIS — E559 Vitamin D deficiency, unspecified: Secondary | ICD-10-CM | POA: Diagnosis not present

## 2016-02-06 DIAGNOSIS — G4710 Hypersomnia, unspecified: Secondary | ICD-10-CM | POA: Diagnosis not present

## 2016-02-06 DIAGNOSIS — M545 Low back pain: Secondary | ICD-10-CM | POA: Diagnosis not present

## 2016-02-06 DIAGNOSIS — K140 Glossitis: Secondary | ICD-10-CM | POA: Diagnosis not present

## 2016-02-10 DIAGNOSIS — M62830 Muscle spasm of back: Secondary | ICD-10-CM | POA: Diagnosis not present

## 2016-02-10 DIAGNOSIS — M9903 Segmental and somatic dysfunction of lumbar region: Secondary | ICD-10-CM | POA: Diagnosis not present

## 2016-02-10 DIAGNOSIS — M5441 Lumbago with sciatica, right side: Secondary | ICD-10-CM | POA: Diagnosis not present

## 2016-02-10 DIAGNOSIS — M5442 Lumbago with sciatica, left side: Secondary | ICD-10-CM | POA: Diagnosis not present

## 2016-02-12 DIAGNOSIS — M5442 Lumbago with sciatica, left side: Secondary | ICD-10-CM | POA: Diagnosis not present

## 2016-02-12 DIAGNOSIS — M62830 Muscle spasm of back: Secondary | ICD-10-CM | POA: Diagnosis not present

## 2016-02-12 DIAGNOSIS — M5441 Lumbago with sciatica, right side: Secondary | ICD-10-CM | POA: Diagnosis not present

## 2016-02-12 DIAGNOSIS — M9903 Segmental and somatic dysfunction of lumbar region: Secondary | ICD-10-CM | POA: Diagnosis not present

## 2016-02-24 DIAGNOSIS — D485 Neoplasm of uncertain behavior of skin: Secondary | ICD-10-CM | POA: Diagnosis not present

## 2016-02-24 DIAGNOSIS — D1801 Hemangioma of skin and subcutaneous tissue: Secondary | ICD-10-CM | POA: Diagnosis not present

## 2016-02-24 DIAGNOSIS — L814 Other melanin hyperpigmentation: Secondary | ICD-10-CM | POA: Diagnosis not present

## 2016-02-24 DIAGNOSIS — Z85828 Personal history of other malignant neoplasm of skin: Secondary | ICD-10-CM | POA: Diagnosis not present

## 2016-02-24 DIAGNOSIS — D225 Melanocytic nevi of trunk: Secondary | ICD-10-CM | POA: Diagnosis not present

## 2016-03-12 DIAGNOSIS — G478 Other sleep disorders: Secondary | ICD-10-CM | POA: Diagnosis not present

## 2016-03-12 DIAGNOSIS — R7981 Abnormal blood-gas level: Secondary | ICD-10-CM | POA: Diagnosis not present

## 2016-03-12 DIAGNOSIS — R0683 Snoring: Secondary | ICD-10-CM | POA: Diagnosis not present

## 2016-03-12 DIAGNOSIS — G2581 Restless legs syndrome: Secondary | ICD-10-CM | POA: Diagnosis not present

## 2016-03-12 DIAGNOSIS — R5383 Other fatigue: Secondary | ICD-10-CM | POA: Diagnosis not present

## 2016-03-12 DIAGNOSIS — J449 Chronic obstructive pulmonary disease, unspecified: Secondary | ICD-10-CM | POA: Diagnosis not present

## 2016-03-19 ENCOUNTER — Other Ambulatory Visit: Payer: Self-pay | Admitting: Internal Medicine

## 2016-03-19 DIAGNOSIS — R079 Chest pain, unspecified: Secondary | ICD-10-CM

## 2016-03-19 DIAGNOSIS — J42 Unspecified chronic bronchitis: Secondary | ICD-10-CM | POA: Diagnosis not present

## 2016-03-19 DIAGNOSIS — G4710 Hypersomnia, unspecified: Secondary | ICD-10-CM | POA: Diagnosis not present

## 2016-03-19 DIAGNOSIS — M545 Low back pain: Secondary | ICD-10-CM | POA: Diagnosis not present

## 2016-03-19 DIAGNOSIS — D509 Iron deficiency anemia, unspecified: Secondary | ICD-10-CM | POA: Diagnosis not present

## 2016-03-24 ENCOUNTER — Telehealth (HOSPITAL_COMMUNITY): Payer: Self-pay | Admitting: *Deleted

## 2016-03-24 NOTE — Telephone Encounter (Signed)
Patient given detailed instructions per Myocardial Perfusion Study Information Sheet for the test on 03/30/16. Patient notified to arrive 15 minutes early and that it is imperative to arrive on time for appointment to keep from having the test rescheduled.  If you need to cancel or reschedule your appointment, please call the office within 24 hours of your appointment. Failure to do so may result in a cancellation of your appointment, and a $50 no show fee. Patient verbalized understanding. Hubbard Robinson, RN

## 2016-03-26 ENCOUNTER — Encounter (HOSPITAL_COMMUNITY): Payer: PPO

## 2016-03-27 DIAGNOSIS — M67442 Ganglion, left hand: Secondary | ICD-10-CM | POA: Diagnosis not present

## 2016-03-30 ENCOUNTER — Ambulatory Visit (HOSPITAL_COMMUNITY): Payer: PPO | Attending: Cardiovascular Disease

## 2016-03-30 ENCOUNTER — Other Ambulatory Visit: Payer: Self-pay | Admitting: Internal Medicine

## 2016-03-30 DIAGNOSIS — I10 Essential (primary) hypertension: Secondary | ICD-10-CM | POA: Insufficient documentation

## 2016-03-30 DIAGNOSIS — E119 Type 2 diabetes mellitus without complications: Secondary | ICD-10-CM | POA: Diagnosis not present

## 2016-03-30 DIAGNOSIS — R0609 Other forms of dyspnea: Secondary | ICD-10-CM | POA: Insufficient documentation

## 2016-03-30 DIAGNOSIS — R079 Chest pain, unspecified: Secondary | ICD-10-CM | POA: Diagnosis not present

## 2016-03-30 DIAGNOSIS — R0789 Other chest pain: Secondary | ICD-10-CM | POA: Insufficient documentation

## 2016-03-30 DIAGNOSIS — G4733 Obstructive sleep apnea (adult) (pediatric): Secondary | ICD-10-CM | POA: Diagnosis not present

## 2016-03-30 MED ORDER — REGADENOSON 0.4 MG/5ML IV SOLN
0.4000 mg | Freq: Once | INTRAVENOUS | Status: AC
  Administered 2016-03-30: 0.4 mg via INTRAVENOUS

## 2016-03-30 MED ORDER — TECHNETIUM TC 99M TETROFOSMIN IV KIT
33.0000 | PACK | Freq: Once | INTRAVENOUS | Status: AC | PRN
  Administered 2016-03-30: 33 via INTRAVENOUS
  Filled 2016-03-30: qty 33

## 2016-03-31 DIAGNOSIS — G4700 Insomnia, unspecified: Secondary | ICD-10-CM | POA: Diagnosis not present

## 2016-03-31 DIAGNOSIS — G4733 Obstructive sleep apnea (adult) (pediatric): Secondary | ICD-10-CM | POA: Diagnosis not present

## 2016-04-01 ENCOUNTER — Ambulatory Visit (HOSPITAL_COMMUNITY): Payer: PPO | Attending: Cardiology

## 2016-04-01 LAB — MYOCARDIAL PERFUSION IMAGING
LV dias vol: 65 mL (ref 46–106)
LV sys vol: 16 mL
Peak HR: 103 {beats}/min
RATE: 0.29
Rest HR: 66 {beats}/min
SDS: 3
SRS: 15
SSS: 18
TID: 0.94

## 2016-04-01 MED ORDER — TECHNETIUM TC 99M TETROFOSMIN IV KIT
32.1000 | PACK | Freq: Once | INTRAVENOUS | Status: AC | PRN
  Administered 2016-04-01: 32.1 via INTRAVENOUS
  Filled 2016-04-01: qty 33

## 2016-04-10 ENCOUNTER — Ambulatory Visit (INDEPENDENT_AMBULATORY_CARE_PROVIDER_SITE_OTHER): Payer: PPO

## 2016-04-10 ENCOUNTER — Encounter: Payer: Self-pay | Admitting: Podiatry

## 2016-04-10 ENCOUNTER — Ambulatory Visit (INDEPENDENT_AMBULATORY_CARE_PROVIDER_SITE_OTHER): Payer: PPO | Admitting: Podiatry

## 2016-04-10 VITALS — BP 118/75 | HR 85 | Resp 16 | Ht 65.0 in | Wt 199.0 lb

## 2016-04-10 DIAGNOSIS — L97501 Non-pressure chronic ulcer of other part of unspecified foot limited to breakdown of skin: Secondary | ICD-10-CM

## 2016-04-10 DIAGNOSIS — M79671 Pain in right foot: Secondary | ICD-10-CM

## 2016-04-10 DIAGNOSIS — M779 Enthesopathy, unspecified: Secondary | ICD-10-CM

## 2016-04-10 DIAGNOSIS — E1149 Type 2 diabetes mellitus with other diabetic neurological complication: Secondary | ICD-10-CM

## 2016-04-10 DIAGNOSIS — M79672 Pain in left foot: Secondary | ICD-10-CM

## 2016-04-10 DIAGNOSIS — E1140 Type 2 diabetes mellitus with diabetic neuropathy, unspecified: Secondary | ICD-10-CM | POA: Diagnosis not present

## 2016-04-10 MED ORDER — TRIAMCINOLONE ACETONIDE 10 MG/ML IJ SUSP
10.0000 mg | Freq: Once | INTRAMUSCULAR | Status: AC
  Administered 2016-04-10: 10 mg

## 2016-04-10 NOTE — Progress Notes (Signed)
   Subjective:    Patient ID: [name], female    DOB: 1941-04-16, 75 y.o.   MRN: [id number]  HPI Chief Complaint  Patient presents with  . Foot Pain    Bilateral; entire feet hurt; x3-4 years; Pt Diabetic Type 2; Sugar=Did not check today; A1C=7.0  . Toe Pain    Left foot, 2nd & 3rd toe; swollen; pt stated, "Has started to hurt more in the past 6 months"      Review of Systems  Musculoskeletal: Positive for back pain, gait problem and myalgias.  All other systems reviewed and are negative.      Objective:   Physical Exam        Assessment & Plan:

## 2016-04-12 NOTE — Progress Notes (Signed)
Subjective:     Patient ID: [name], female   DOB: 06-30-1940, 75 y.o.   MRN: [id number]  HPI patient presents stating that she had an open area on the end of her third toe left and she's had diabetes a long time and is getting concerned because it seems like swelling and pain seem to be getting gradually worse   Review of Systems  All other systems reviewed and are negative.      Objective:   Physical Exam  Constitutional: She is oriented to person, place, and time.  Cardiovascular: Intact distal pulses.   Musculoskeletal: Normal range of motion.  Neurological: She is oriented to person, place, and time.  Skin: Skin is warm and dry.  Nursing note and vitals reviewed.  vascular indicated intact pulses but slightly weak with diminished sharp [place] vibratory bilateral. And left has a small keratotic lesion but no active drainage at this time or proximal edema erythema or other pathology noted. Patient does have moderate structural digital deformities and has had history of pre-ulcerated of type lesions     Assessment:     At risk diabetic with no current open ulcerations    Plan:     X-ray an H&P reviewed with patient and diabetic education rendered. Along with daily visits. Instructed on padding therapy and dispensed pads for the toes and recommended long-term diabetic shoes to try to prevent any kind of future issues  X-ray report was negative for signs of fracture and did indicate structural deformity

## 2016-04-20 DIAGNOSIS — M25562 Pain in left knee: Secondary | ICD-10-CM | POA: Diagnosis not present

## 2016-04-20 DIAGNOSIS — G8929 Other chronic pain: Secondary | ICD-10-CM | POA: Diagnosis not present

## 2016-04-22 ENCOUNTER — Telehealth: Payer: Self-pay | Admitting: *Deleted

## 2016-04-22 NOTE — Telephone Encounter (Signed)
Pt states she was seen 2 weeks ago by Dr. Paulla Dolly and she would like to know the status of the Diabetic shoe paperwork.

## 2016-05-08 DIAGNOSIS — J069 Acute upper respiratory infection, unspecified: Secondary | ICD-10-CM | POA: Diagnosis not present

## 2016-05-08 DIAGNOSIS — R05 Cough: Secondary | ICD-10-CM | POA: Diagnosis not present

## 2016-05-08 DIAGNOSIS — R062 Wheezing: Secondary | ICD-10-CM | POA: Diagnosis not present

## 2016-05-12 DIAGNOSIS — J209 Acute bronchitis, unspecified: Secondary | ICD-10-CM | POA: Diagnosis not present

## 2016-05-19 DIAGNOSIS — M25562 Pain in left knee: Secondary | ICD-10-CM | POA: Diagnosis not present

## 2016-05-19 DIAGNOSIS — G8929 Other chronic pain: Secondary | ICD-10-CM | POA: Diagnosis not present

## 2016-05-25 ENCOUNTER — Other Ambulatory Visit: Payer: Self-pay | Admitting: Internal Medicine

## 2016-05-25 DIAGNOSIS — Z1231 Encounter for screening mammogram for malignant neoplasm of breast: Secondary | ICD-10-CM

## 2016-05-29 ENCOUNTER — Ambulatory Visit: Payer: PPO | Admitting: Cardiology

## 2016-05-29 DIAGNOSIS — G8929 Other chronic pain: Secondary | ICD-10-CM | POA: Diagnosis not present

## 2016-05-29 DIAGNOSIS — M25562 Pain in left knee: Secondary | ICD-10-CM | POA: Diagnosis not present

## 2016-06-02 DIAGNOSIS — M25562 Pain in left knee: Secondary | ICD-10-CM | POA: Diagnosis not present

## 2016-06-02 DIAGNOSIS — G8929 Other chronic pain: Secondary | ICD-10-CM | POA: Diagnosis not present

## 2016-06-04 DIAGNOSIS — M25562 Pain in left knee: Secondary | ICD-10-CM | POA: Diagnosis not present

## 2016-06-04 DIAGNOSIS — G8929 Other chronic pain: Secondary | ICD-10-CM | POA: Diagnosis not present

## 2016-06-08 ENCOUNTER — Ambulatory Visit: Payer: PPO | Admitting: Cardiology

## 2016-06-09 DIAGNOSIS — G8929 Other chronic pain: Secondary | ICD-10-CM | POA: Diagnosis not present

## 2016-06-09 DIAGNOSIS — M5416 Radiculopathy, lumbar region: Secondary | ICD-10-CM | POA: Diagnosis not present

## 2016-06-09 DIAGNOSIS — M25562 Pain in left knee: Secondary | ICD-10-CM | POA: Diagnosis not present

## 2016-06-12 ENCOUNTER — Ambulatory Visit (INDEPENDENT_AMBULATORY_CARE_PROVIDER_SITE_OTHER): Payer: PPO | Admitting: Cardiology

## 2016-06-12 ENCOUNTER — Encounter: Payer: Self-pay | Admitting: Cardiology

## 2016-06-12 VITALS — BP 132/78 | HR 70 | Ht 66.0 in | Wt 206.8 lb

## 2016-06-12 DIAGNOSIS — I10 Essential (primary) hypertension: Secondary | ICD-10-CM | POA: Diagnosis not present

## 2016-06-12 DIAGNOSIS — R0600 Dyspnea, unspecified: Secondary | ICD-10-CM | POA: Diagnosis not present

## 2016-06-12 DIAGNOSIS — R002 Palpitations: Secondary | ICD-10-CM | POA: Diagnosis not present

## 2016-06-12 NOTE — Progress Notes (Signed)
[facility] [address] [place], [place]  [address] Phone: [phone] Fax:  [phone]  Date:  06/12/2016   ID:  BOYCE MURDOUGH, DOB 11/30/1940, MRN [id number]  PCP:  Henrine Screws, MD   History of Present Illness: [name] is a 76 y.o. female for follow up of dyspnea on exertion as well as evaluation of episodes of high heart rate at nighttime. This, dyspnea has been  ever since she had her knee surgery she states. Chest x-ray was unremarkable. Dr. Inda Merlin walked her around the office and she became quite short of breath but her oxygen saturations maintained at 98%. No wheezes, no obvious chest pain. She has not been having any claudication. While doing water aerobics for approximately an hour she feels fine.  Cardiac risk factors of diabetes and history of DVT in 2003. Her father had a heart attack in his 65s. Her hemoglobin was 12.4.   In regards to her fast heart rates at night, she is splitting her Toprol and doing quite well with this. This is improved.  Her shortness of breath is fairly similar but may be improved. She has been taking care of her husband who has had bypass surgery.  She has had a reassuring workup from pulmonary medicine as well as.   - 2012, 2014,2017 she underwent a nuclear stress test which was low risk, no ischemia.   - She's also previously undergone CT scan to rule out pulmonary embolism which was negative.   - Echocardiogram performed on 12/06/12 was overall reassuring. Normal EF.   - Cardiopulmonary stress test on 07/11/15 also reassuring     Wt Readings from Last 3 Encounters:  06/12/16 206 lb 12.8 oz (93.8 kg)  04/10/16 199 lb (90.3 kg)  03/30/16 202 lb (91.6 kg)     Past Medical History:  Diagnosis Date  . Anemia   . Arthritis   . Diabetes mellitus   . DVT (deep venous thrombosis) ([place])   . GERD (gastroesophageal reflux disease)   . Hypertension    dr Marlou Porch  . Rapid heartbeat     Past Surgical History:    Procedure Laterality Date  . ABDOMINAL HYSTERECTOMY  1981  . APPENDECTOMY    . ARTHOSCOPIC ROTAOR CUFF REPAIR Left 2010 or 2011  . BACK SURGERY  x 2 1999 and 2008   lower back L 4 to L 5 L5 to S 1  . CARPAL TUNNEL RELEASE Right 2014  . COLONOSCOPY WITH PROPOFOL N/A 10/25/2012   Procedure: COLONOSCOPY WITH PROPOFOL;  Surgeon: [name], MD;  Location: [facility];  Service: Endoscopy;  Laterality: N/A;  . JOINT REPLACEMENT Right 2012   knee  . ovarian wedge resection  1966  . SPINAL CORD STIMULATOR INSERTION N/A 11/03/2012   Procedure: LUMBAR SPINAL CORD STIMULATOR INSERTION;  Surgeon: [name], MD;  Location: [facility];  Service: Orthopedics;  Laterality: N/A;  . TONSILLECTOMY  1960    Current Outpatient Prescriptions  Medication Sig Dispense Refill  . B Complex-C (SUPER B COMPLEX PO) Take 1 tablet by mouth daily. Plus vitamin c    . Biotin 5000 MCG TABS Take 5,000 mg by mouth daily.     . calcium carbonate (OS-CAL) 600 MG TABS Take 600 mg by mouth daily.    . Cholecalciferol (VITAMIN D) 2000 UNITS tablet Take 2,000 Units by mouth daily.    Marland Kitchen Co-Enzyme Q10 200 MG CAPS Take 200 mg by mouth every evening.    Marland Kitchen  estradiol (ESTRACE) 1 MG tablet Take 1 mg by mouth daily.    . fish oil-omega-3 fatty acids 1000 MG capsule Take 1 g by mouth 2 (two) times daily.     . fluticasone furoate-vilanterol (BREO ELLIPTA) 100-25 MCG/INH AEPB Inhale 1 puff into the lungs daily.    Marland Kitchen glipiZIDE (GLUCOTROL) 5 MG tablet Take 2 tablets twice daily    . HYDROcodone-acetaminophen (NORCO) 7.5-325 MG per tablet Take 1 tablet by mouth as needed.     . Insulin Glargine (TOUJEO SOLOSTAR) 300 UNIT/ML SOPN Inject 60 Units as directed daily.    . Insulin Pen Needle (BD PEN NEEDLE NANO U/F) 32G X 4 MM MISC     . losartan (COZAAR) 100 MG tablet Take 100 mg by mouth every evening.     . Magnesium 500 MG TABS Take 500 mg by mouth at bedtime.     . metFORMIN (GLUCOPHAGE) 1000 MG tablet Take 1,000 mg by mouth 2  (two) times daily with a meal.    . metoprolol succinate (TOPROL-XL) 50 MG 24 hr tablet Take 1 tablet (50 mg total) by mouth 2 (two) times daily. 180 tablet 3  . Multiple Vitamins-Minerals (MULTIVITAMIN WITH MINERALS) tablet Take 1 tablet by mouth daily.    . niacinamide 500 MG tablet Take 500 mg by mouth daily.    . rosuvastatin (CRESTOR) 10 MG tablet Take 10 mg by mouth every evening.      No current facility-administered medications for this visit.     Allergies:    Allergies  Allergen Reactions  . Hydrochlorothiazide Anaphylaxis    low bp  . Ace Inhibitors     Other reaction(s): cough  . Antihistamines, Chlorpheniramine-Type     Other reaction(s): hyper  . Ezetimibe-Simvastatin     Other reaction(s): myalgia  . Indomethacin     Other reaction(s): peptic ulcers  . Lisinopril Other (See Comments)  . Lyrica [Pregabalin] Other (See Comments)    Weight gain, drowsy  . Prednisone Other (See Comments)  . Simvastatin     Other reaction(s): Elevated LFT's  . Troglitazone Other (See Comments)  . Vioxx [Rofecoxib] Other (See Comments)    Other reaction(s): anxiety & chills    Social History:  The patient  reports that she has never smoked. She has never used smokeless tobacco. She reports that she does not drink alcohol or use drugs.   Family History  Problem Relation Age of Onset  . Heart disease Father   . Diabetes Mother     ROS:  Please see the history of present illness.   Denies any fevers, chills, orthopnea, PND.   All other systems reviewed and negative.   PHYSICAL EXAM: VS:  BP 132/78   Pulse 70   Ht 5\' 6"  (1.676 m)   Wt 206 lb 12.8 oz (93.8 kg)   LMP  (LMP Unknown)   BMI 33.38 kg/m  Well nourished, well developed, in no acute distress  HEENT: normal, [place]/AT, EOMI Neck: no JVD, normal carotid upstroke, no bruit Cardiac:  normal S1, S2; RRR; no murmur  Lungs:  clear to auscultation bilaterally, no wheezing, rhonchi or rales  Abd: soft, nontender, no  hepatomegaly, no bruits  Ext: no edema, 2+ distal pulses Skin: warm and dry  GU: deferred Neuro: no focal abnormalities noted, AAO x 3  EKG:  Today 06/12/16-sinus rhythm 72 septal Q waves personally viewed-prior 05/14/15-sinus rhythm, 73, poor R-wave progression, septal infarct pattern personally viewed-no change from prior-previous 01/19/14-sinus rhythm, septal infarct pattern  ASSESSMENT AND PLAN:  1. Elevated heart rate at night/palpitations - improved since we split her Toprol and take half in the morning, half in the evening. 2. Dyspnea-improved. She has lost approximately 20 pounds in the past. Cardiopulmonary stress test showed normal exercise capacity with no cardiac or pulmonary limitations. Reviewed Dr. Matilde Bash note. Recent nuclear stress test in November 2017 also reassuring.. No further cardiac workup.Dr. Inda Merlin placed on [place] - feels better 3. Hyperlipidemia-Crestor. Dr. Inda Merlin will continue to manage. 4. Hypertension-currently well controlled. Excellent job with weight loss. 5. Obesity-excellent job with weight loss. Water aerobics. Reassuringly, she is not having any high risk symptoms during her water aerobics. Keep working on weight loss. 6. When necessary follow-up  Signed, [name], MD [facility]  06/12/2016 10:07 AM

## 2016-06-12 NOTE — Patient Instructions (Signed)
Medication Instructions:  The current medical regimen is effective;  continue present plan and medications.  Follow-Up: Follow up as needed with Dr Skains.  Thank you for choosing [facility]!!     

## 2016-06-16 ENCOUNTER — Ambulatory Visit
Admission: RE | Admit: 2016-06-16 | Discharge: 2016-06-16 | Disposition: A | Discharge status: Home / Self Care | Payer: PPO | Source: Ambulatory Visit | Attending: Internal Medicine | Admitting: Internal Medicine

## 2016-06-16 DIAGNOSIS — Z1231 Encounter for screening mammogram for malignant neoplasm of breast: Secondary | ICD-10-CM | POA: Diagnosis not present

## 2016-06-17 ENCOUNTER — Other Ambulatory Visit: Payer: Self-pay | Admitting: Internal Medicine

## 2016-06-17 ENCOUNTER — Ambulatory Visit: Payer: PPO

## 2016-06-17 DIAGNOSIS — E1140 Type 2 diabetes mellitus with diabetic neuropathy, unspecified: Secondary | ICD-10-CM

## 2016-06-17 DIAGNOSIS — R928 Other abnormal and inconclusive findings on diagnostic imaging of breast: Secondary | ICD-10-CM

## 2016-06-17 DIAGNOSIS — E1149 Type 2 diabetes mellitus with other diabetic neurological complication: Principal | ICD-10-CM

## 2016-06-18 ENCOUNTER — Ambulatory Visit: Payer: PPO | Admitting: Cardiology

## 2016-06-19 NOTE — Progress Notes (Signed)
Patient presents to be measured for diabetic shoes and inserts.  We will call when shoes and inserts arrive 

## 2016-06-23 ENCOUNTER — Other Ambulatory Visit: Payer: PPO

## 2016-06-24 DIAGNOSIS — M5415 Radiculopathy, thoracolumbar region: Secondary | ICD-10-CM | POA: Diagnosis not present

## 2016-06-24 DIAGNOSIS — G4733 Obstructive sleep apnea (adult) (pediatric): Secondary | ICD-10-CM | POA: Diagnosis not present

## 2016-06-24 DIAGNOSIS — Z7984 Long term (current) use of oral hypoglycemic drugs: Secondary | ICD-10-CM | POA: Diagnosis not present

## 2016-06-24 DIAGNOSIS — J42 Unspecified chronic bronchitis: Secondary | ICD-10-CM | POA: Diagnosis not present

## 2016-06-24 DIAGNOSIS — E1140 Type 2 diabetes mellitus with diabetic neuropathy, unspecified: Secondary | ICD-10-CM | POA: Diagnosis not present

## 2016-06-24 DIAGNOSIS — R109 Unspecified abdominal pain: Secondary | ICD-10-CM | POA: Diagnosis not present

## 2016-06-24 DIAGNOSIS — M545 Low back pain: Secondary | ICD-10-CM | POA: Diagnosis not present

## 2016-06-24 DIAGNOSIS — N390 Urinary tract infection, site not specified: Secondary | ICD-10-CM | POA: Diagnosis not present

## 2016-06-27 DIAGNOSIS — M5416 Radiculopathy, lumbar region: Secondary | ICD-10-CM | POA: Diagnosis not present

## 2016-06-30 ENCOUNTER — Other Ambulatory Visit: Payer: PPO

## 2016-06-30 ENCOUNTER — Ambulatory Visit
Admission: RE | Admit: 2016-06-30 | Discharge: 2016-06-30 | Disposition: A | Discharge status: Home / Self Care | Payer: PPO | Source: Ambulatory Visit | Attending: Internal Medicine | Admitting: Internal Medicine

## 2016-06-30 DIAGNOSIS — R928 Other abnormal and inconclusive findings on diagnostic imaging of breast: Secondary | ICD-10-CM

## 2016-07-06 ENCOUNTER — Ambulatory Visit: Payer: PPO | Admitting: *Deleted

## 2016-07-06 DIAGNOSIS — E1149 Type 2 diabetes mellitus with other diabetic neurological complication: Principal | ICD-10-CM

## 2016-07-06 DIAGNOSIS — E1140 Type 2 diabetes mellitus with diabetic neuropathy, unspecified: Secondary | ICD-10-CM

## 2016-07-06 NOTE — Patient Instructions (Signed)

## 2016-07-06 NOTE — Progress Notes (Signed)
Patient ID: [name], female   DOB: 23-Apr-1941, 76 y.o.   MRN: [id number]  Patient presents for diabetic shoe pick up, shoes are tried on for good fit and are found to not tight.  We will reorder as an extra wide and call patient to reschedule pick up when they arrive.

## 2016-07-08 DIAGNOSIS — G8929 Other chronic pain: Secondary | ICD-10-CM | POA: Diagnosis not present

## 2016-07-08 DIAGNOSIS — M7052 Other bursitis of knee, left knee: Secondary | ICD-10-CM | POA: Diagnosis not present

## 2016-07-08 DIAGNOSIS — M25561 Pain in right knee: Secondary | ICD-10-CM | POA: Diagnosis not present

## 2016-07-08 DIAGNOSIS — M1712 Unilateral primary osteoarthritis, left knee: Secondary | ICD-10-CM | POA: Diagnosis not present

## 2016-07-10 DIAGNOSIS — N390 Urinary tract infection, site not specified: Secondary | ICD-10-CM | POA: Diagnosis not present

## 2016-07-22 DIAGNOSIS — M9903 Segmental and somatic dysfunction of lumbar region: Secondary | ICD-10-CM | POA: Diagnosis not present

## 2016-07-22 DIAGNOSIS — Z96651 Presence of right artificial knee joint: Secondary | ICD-10-CM | POA: Diagnosis not present

## 2016-07-22 DIAGNOSIS — M62830 Muscle spasm of back: Secondary | ICD-10-CM | POA: Diagnosis not present

## 2016-07-22 DIAGNOSIS — G8929 Other chronic pain: Secondary | ICD-10-CM | POA: Diagnosis not present

## 2016-07-22 DIAGNOSIS — M25561 Pain in right knee: Secondary | ICD-10-CM | POA: Diagnosis not present

## 2016-07-22 DIAGNOSIS — M5442 Lumbago with sciatica, left side: Secondary | ICD-10-CM | POA: Diagnosis not present

## 2016-07-22 DIAGNOSIS — M5441 Lumbago with sciatica, right side: Secondary | ICD-10-CM | POA: Diagnosis not present

## 2016-07-23 DIAGNOSIS — M5442 Lumbago with sciatica, left side: Secondary | ICD-10-CM | POA: Diagnosis not present

## 2016-07-23 DIAGNOSIS — M62830 Muscle spasm of back: Secondary | ICD-10-CM | POA: Diagnosis not present

## 2016-07-23 DIAGNOSIS — M5441 Lumbago with sciatica, right side: Secondary | ICD-10-CM | POA: Diagnosis not present

## 2016-07-23 DIAGNOSIS — M9903 Segmental and somatic dysfunction of lumbar region: Secondary | ICD-10-CM | POA: Diagnosis not present

## 2016-07-24 ENCOUNTER — Other Ambulatory Visit: Payer: Self-pay | Admitting: Internal Medicine

## 2016-07-24 DIAGNOSIS — R109 Unspecified abdominal pain: Secondary | ICD-10-CM | POA: Diagnosis not present

## 2016-07-28 DIAGNOSIS — M5416 Radiculopathy, lumbar region: Secondary | ICD-10-CM | POA: Diagnosis not present

## 2016-07-30 DIAGNOSIS — M1712 Unilateral primary osteoarthritis, left knee: Secondary | ICD-10-CM | POA: Diagnosis not present

## 2016-08-03 ENCOUNTER — Ambulatory Visit (INDEPENDENT_AMBULATORY_CARE_PROVIDER_SITE_OTHER): Payer: PPO | Admitting: Podiatry

## 2016-08-03 DIAGNOSIS — M2021 Hallux rigidus, right foot: Secondary | ICD-10-CM

## 2016-08-03 DIAGNOSIS — E1140 Type 2 diabetes mellitus with diabetic neuropathy, unspecified: Secondary | ICD-10-CM

## 2016-08-03 DIAGNOSIS — M5441 Lumbago with sciatica, right side: Secondary | ICD-10-CM | POA: Diagnosis not present

## 2016-08-03 DIAGNOSIS — M2020 Hallux rigidus, unspecified foot: Secondary | ICD-10-CM | POA: Diagnosis not present

## 2016-08-03 DIAGNOSIS — M62830 Muscle spasm of back: Secondary | ICD-10-CM | POA: Diagnosis not present

## 2016-08-03 DIAGNOSIS — M5442 Lumbago with sciatica, left side: Secondary | ICD-10-CM | POA: Diagnosis not present

## 2016-08-03 DIAGNOSIS — M1712 Unilateral primary osteoarthritis, left knee: Secondary | ICD-10-CM | POA: Diagnosis not present

## 2016-08-03 DIAGNOSIS — L97501 Non-pressure chronic ulcer of other part of unspecified foot limited to breakdown of skin: Secondary | ICD-10-CM

## 2016-08-03 DIAGNOSIS — M2022 Hallux rigidus, left foot: Secondary | ICD-10-CM

## 2016-08-03 DIAGNOSIS — E1149 Type 2 diabetes mellitus with other diabetic neurological complication: Principal | ICD-10-CM

## 2016-08-03 DIAGNOSIS — L89899 Pressure ulcer of other site, unspecified stage: Secondary | ICD-10-CM | POA: Diagnosis not present

## 2016-08-03 DIAGNOSIS — M9903 Segmental and somatic dysfunction of lumbar region: Secondary | ICD-10-CM | POA: Diagnosis not present

## 2016-08-05 DIAGNOSIS — M1712 Unilateral primary osteoarthritis, left knee: Secondary | ICD-10-CM | POA: Diagnosis not present

## 2016-08-10 ENCOUNTER — Ambulatory Visit
Admission: RE | Admit: 2016-08-10 | Discharge: 2016-08-10 | Disposition: A | Discharge status: Home / Self Care | Payer: PPO | Source: Ambulatory Visit | Attending: Internal Medicine | Admitting: Internal Medicine

## 2016-08-10 DIAGNOSIS — R109 Unspecified abdominal pain: Secondary | ICD-10-CM

## 2016-08-10 DIAGNOSIS — N200 Calculus of kidney: Secondary | ICD-10-CM | POA: Diagnosis not present

## 2016-08-10 MED ORDER — IOPAMIDOL (ISOVUE-300) INJECTION 61%
100.0000 mL | Freq: Once | INTRAVENOUS | Status: AC | PRN
  Administered 2016-08-10: 100 mL via INTRAVENOUS

## 2016-08-11 DIAGNOSIS — M1712 Unilateral primary osteoarthritis, left knee: Secondary | ICD-10-CM | POA: Diagnosis not present

## 2016-08-11 NOTE — Patient Instructions (Signed)

## 2016-08-11 NOTE — Progress Notes (Signed)
Patient ID: [name], female   DOB: 09-24-1940, 76 y.o.   MRN: [id number]  Patient presents for diabetic shoe pick up, shoes are tried on for good fit.  Patient received 1 Pair New Balance [phone] silver & mint in women's size 10 extra wide and 3 pairs custom molded diabetic inserts.  Verbal and written break in and wear instructions given.  Patient will follow up for scheduled routine care.

## 2016-08-12 DIAGNOSIS — M5416 Radiculopathy, lumbar region: Secondary | ICD-10-CM | POA: Diagnosis not present

## 2016-08-13 DIAGNOSIS — M1712 Unilateral primary osteoarthritis, left knee: Secondary | ICD-10-CM | POA: Diagnosis not present

## 2016-08-18 DIAGNOSIS — Z1389 Encounter for screening for other disorder: Secondary | ICD-10-CM | POA: Diagnosis not present

## 2016-08-18 DIAGNOSIS — J309 Allergic rhinitis, unspecified: Secondary | ICD-10-CM | POA: Diagnosis not present

## 2016-08-18 DIAGNOSIS — E669 Obesity, unspecified: Secondary | ICD-10-CM | POA: Diagnosis not present

## 2016-08-18 DIAGNOSIS — G4733 Obstructive sleep apnea (adult) (pediatric): Secondary | ICD-10-CM | POA: Diagnosis not present

## 2016-08-18 DIAGNOSIS — J449 Chronic obstructive pulmonary disease, unspecified: Secondary | ICD-10-CM | POA: Diagnosis not present

## 2016-08-18 DIAGNOSIS — M5416 Radiculopathy, lumbar region: Secondary | ICD-10-CM | POA: Diagnosis not present

## 2016-08-18 DIAGNOSIS — K219 Gastro-esophageal reflux disease without esophagitis: Secondary | ICD-10-CM | POA: Diagnosis not present

## 2016-08-18 DIAGNOSIS — Z6833 Body mass index (BMI) 33.0-33.9, adult: Secondary | ICD-10-CM | POA: Diagnosis not present

## 2016-08-18 DIAGNOSIS — Z79899 Other long term (current) drug therapy: Secondary | ICD-10-CM | POA: Diagnosis not present

## 2016-08-18 DIAGNOSIS — Z0001 Encounter for general adult medical examination with abnormal findings: Secondary | ICD-10-CM | POA: Diagnosis not present

## 2016-08-18 DIAGNOSIS — E559 Vitamin D deficiency, unspecified: Secondary | ICD-10-CM | POA: Diagnosis not present

## 2016-08-18 DIAGNOSIS — M1712 Unilateral primary osteoarthritis, left knee: Secondary | ICD-10-CM | POA: Diagnosis not present

## 2016-08-18 DIAGNOSIS — K594 Anal spasm: Secondary | ICD-10-CM | POA: Diagnosis not present

## 2016-08-19 DIAGNOSIS — M7052 Other bursitis of knee, left knee: Secondary | ICD-10-CM | POA: Diagnosis not present

## 2016-08-19 DIAGNOSIS — M1712 Unilateral primary osteoarthritis, left knee: Secondary | ICD-10-CM | POA: Diagnosis not present

## 2016-08-20 DIAGNOSIS — M5442 Lumbago with sciatica, left side: Secondary | ICD-10-CM | POA: Diagnosis not present

## 2016-08-20 DIAGNOSIS — M5441 Lumbago with sciatica, right side: Secondary | ICD-10-CM | POA: Diagnosis not present

## 2016-08-20 DIAGNOSIS — M62830 Muscle spasm of back: Secondary | ICD-10-CM | POA: Diagnosis not present

## 2016-08-20 DIAGNOSIS — M9903 Segmental and somatic dysfunction of lumbar region: Secondary | ICD-10-CM | POA: Diagnosis not present

## 2016-08-24 DIAGNOSIS — M9903 Segmental and somatic dysfunction of lumbar region: Secondary | ICD-10-CM | POA: Diagnosis not present

## 2016-08-24 DIAGNOSIS — M5441 Lumbago with sciatica, right side: Secondary | ICD-10-CM | POA: Diagnosis not present

## 2016-08-24 DIAGNOSIS — M5442 Lumbago with sciatica, left side: Secondary | ICD-10-CM | POA: Diagnosis not present

## 2016-08-24 DIAGNOSIS — M62830 Muscle spasm of back: Secondary | ICD-10-CM | POA: Diagnosis not present

## 2016-08-26 DIAGNOSIS — M5442 Lumbago with sciatica, left side: Secondary | ICD-10-CM | POA: Diagnosis not present

## 2016-08-26 DIAGNOSIS — M9903 Segmental and somatic dysfunction of lumbar region: Secondary | ICD-10-CM | POA: Diagnosis not present

## 2016-08-26 DIAGNOSIS — M62830 Muscle spasm of back: Secondary | ICD-10-CM | POA: Diagnosis not present

## 2016-08-26 DIAGNOSIS — M5441 Lumbago with sciatica, right side: Secondary | ICD-10-CM | POA: Diagnosis not present

## 2016-08-27 DIAGNOSIS — M5442 Lumbago with sciatica, left side: Secondary | ICD-10-CM | POA: Diagnosis not present

## 2016-08-27 DIAGNOSIS — M62830 Muscle spasm of back: Secondary | ICD-10-CM | POA: Diagnosis not present

## 2016-08-27 DIAGNOSIS — M5441 Lumbago with sciatica, right side: Secondary | ICD-10-CM | POA: Diagnosis not present

## 2016-08-27 DIAGNOSIS — M9903 Segmental and somatic dysfunction of lumbar region: Secondary | ICD-10-CM | POA: Diagnosis not present

## 2016-09-01 DIAGNOSIS — M545 Low back pain: Secondary | ICD-10-CM | POA: Diagnosis not present

## 2016-09-02 ENCOUNTER — Other Ambulatory Visit: Payer: Self-pay | Admitting: Neurological Surgery

## 2016-09-02 DIAGNOSIS — M545 Low back pain: Principal | ICD-10-CM

## 2016-09-02 DIAGNOSIS — G8929 Other chronic pain: Secondary | ICD-10-CM

## 2016-09-10 ENCOUNTER — Ambulatory Visit
Admission: RE | Admit: 2016-09-10 | Discharge: 2016-09-10 | Disposition: A | Discharge status: Home / Self Care | Payer: PPO | Source: Ambulatory Visit | Attending: Neurological Surgery | Admitting: Neurological Surgery

## 2016-09-10 DIAGNOSIS — M545 Low back pain: Secondary | ICD-10-CM | POA: Diagnosis not present

## 2016-09-10 DIAGNOSIS — G8929 Other chronic pain: Secondary | ICD-10-CM

## 2016-09-10 MED ORDER — MEPERIDINE HCL 100 MG/ML IJ SOLN
50.0000 mg | Freq: Once | INTRAMUSCULAR | Status: AC
  Administered 2016-09-10: 50 mg via INTRAMUSCULAR

## 2016-09-10 MED ORDER — DIAZEPAM 5 MG PO TABS
5.0000 mg | ORAL_TABLET | Freq: Once | ORAL | Status: AC
  Administered 2016-09-10: 5 mg via ORAL

## 2016-09-10 MED ORDER — ONDANSETRON HCL 4 MG/2ML IJ SOLN
4.0000 mg | Freq: Once | INTRAMUSCULAR | Status: AC
  Administered 2016-09-10: 4 mg via INTRAMUSCULAR

## 2016-09-10 MED ORDER — IOPAMIDOL (ISOVUE-M 200) INJECTION 41%
15.0000 mL | Freq: Once | INTRAMUSCULAR | Status: AC
  Administered 2016-09-10: 15 mL via INTRATHECAL

## 2016-09-10 NOTE — Discharge Instructions (Signed)

## 2016-09-11 DIAGNOSIS — M5416 Radiculopathy, lumbar region: Secondary | ICD-10-CM | POA: Diagnosis not present

## 2016-09-14 DIAGNOSIS — M1712 Unilateral primary osteoarthritis, left knee: Secondary | ICD-10-CM | POA: Diagnosis not present

## 2016-09-15 ENCOUNTER — Other Ambulatory Visit: Payer: Self-pay | Admitting: Neurological Surgery

## 2016-09-15 DIAGNOSIS — M48062 Spinal stenosis, lumbar region with neurogenic claudication: Secondary | ICD-10-CM | POA: Diagnosis not present

## 2016-09-21 ENCOUNTER — Other Ambulatory Visit: Payer: Self-pay | Admitting: Internal Medicine

## 2016-09-21 DIAGNOSIS — R609 Edema, unspecified: Secondary | ICD-10-CM

## 2016-09-21 DIAGNOSIS — R600 Localized edema: Secondary | ICD-10-CM | POA: Diagnosis not present

## 2016-09-22 DIAGNOSIS — M1712 Unilateral primary osteoarthritis, left knee: Secondary | ICD-10-CM | POA: Diagnosis not present

## 2016-09-24 ENCOUNTER — Ambulatory Visit
Admission: RE | Admit: 2016-09-24 | Discharge: 2016-09-24 | Disposition: A | Discharge status: Home / Self Care | Payer: PPO | Source: Ambulatory Visit | Attending: Internal Medicine | Admitting: Internal Medicine

## 2016-09-24 DIAGNOSIS — R600 Localized edema: Secondary | ICD-10-CM | POA: Diagnosis not present

## 2016-09-24 DIAGNOSIS — R609 Edema, unspecified: Secondary | ICD-10-CM

## 2016-09-29 DIAGNOSIS — M1712 Unilateral primary osteoarthritis, left knee: Secondary | ICD-10-CM | POA: Diagnosis not present

## 2016-09-29 DIAGNOSIS — M25562 Pain in left knee: Secondary | ICD-10-CM | POA: Diagnosis not present

## 2016-09-29 DIAGNOSIS — Z79890 Hormone replacement therapy (postmenopausal): Secondary | ICD-10-CM | POA: Diagnosis not present

## 2016-09-29 DIAGNOSIS — R600 Localized edema: Secondary | ICD-10-CM | POA: Diagnosis not present

## 2016-09-29 DIAGNOSIS — M5416 Radiculopathy, lumbar region: Secondary | ICD-10-CM | POA: Diagnosis not present

## 2016-09-29 NOTE — Pre-Procedure Instructions (Signed)
[name]  09/29/2016      [id number] - [place] [address] Phone: [phone] Fax: [phone]  [facility] - [place], [place] [address] Phone: [phone] Fax: [phone]    Your procedure is scheduled on June 1 .  Report to [facility] Admitting at 530 A.M.  Call this number if you have problems the morning of surgery:  [phone]   Remember:  Do not eat food or drink liquids after midnight.  Take these medicines the morning of surgery with A SIP OF WATER Breo inhaler, Hydrocodone (norco) i or percocet if needend, Metoprolol succinate (Toprol-XL)  Bring your inhaler with you on the day of surgery.  Stop aspirin as directed by your Dr.   Stop taking BC', Goody's, Herbal medications, Ibuprofen    How to Manage Your Diabetes Before and After Surgery  Why is it important to control my blood sugar before and after surgery? . Improving blood sugar levels before and after surgery helps healing and can limit problems. . A way of improving blood sugar control is eating a healthy diet by: o  Eating less sugar and carbohydrates o  Increasing activity/exercise o  Talking with your doctor about reaching your blood sugar goals . High blood sugars (greater than 180 mg/dL) can raise your risk of infections and slow your recovery, so you will need to focus on controlling your diabetes during the weeks before surgery. . Make sure that the doctor who takes care of your diabetes knows about your planned surgery including the date and location.  How do I manage my blood sugar before surgery? . Check your blood sugar at least 4 times a day, starting 2 days before surgery, to make sure that the level is not too high or low. o Check your blood sugar the morning of your surgery when you wake up and every 2 hours until you get to  the Short Stay unit. . If your blood sugar is less than 70 mg/dL, you will need to treat for low blood sugar: o Do not take insulin. o Treat a low blood sugar (less than 70 mg/dL) with  cup of clear juice (cranberry or apple), 4 glucose tablets, OR glucose gel. o Recheck blood sugar in 15 minutes after treatment (to make sure it is greater than 70 mg/dL). If your blood sugar is not greater than 70 mg/dL on recheck, call [phone] for further instructions. . Report your blood sugar to the short stay nurse when you get to Short Stay.  . If you are admitted to the hospital after surgery: o Your blood sugar will be checked by the staff and you will probably be given insulin after surgery (instead of oral diabetes medicines) to make sure you have good blood sugar levels. o The goal for blood sugar control after surgery is 80-180 mg/dL.       WHAT DO I DO ABOUT MY DIABETES MEDICATION?   Marland Kitchen Do not take oral diabetes medicines (pills) the morning of surgery. Glucotrol and Metformin (Glucophage)  Take only Morning and /or lunch doses the day before surgery.  . THE NIGHT BEFORE SURGERY, take ___________ units of ___________insulin.       Marland Kitchen HE MORNING OF SURGERY, take 30 units of Toujeo insulin.  . The day of surgery, do not take other diabetes injectables, including Byetta (exenatide), Bydureon (  exenatide ER), Victoza (liraglutide), or Trulicity (dulaglutide).  . If your CBG is greater than 220 mg/dL, you may take  of your sliding scale (correction) dose of insulin.  Other Instructions:          Patient Signature:  Date:   Nurse Signature:  Date:   Reviewed and Endorsed by [facility] Patient Education Committee, August 2015  Do not wear jewelry, make-up or nail polish.  Do not wear lotions, powders, or perfumes, or deoderant.  Do not shave 48 hours prior to surgery.  Men may shave face and neck.  Do not bring valuables to the hospital.  [facility] is not responsible for any  belongings or valuables.  Contacts, dentures or bridgework may not be worn into surgery.  Leave your suitcase in the car.  After surgery it may be brought to your room.  For patients admitted to the hospital, discharge time will be determined by your treatment team.  Patients discharged the day of surgery will not be allowed to drive home.   Special instructions:  [facility] - Preparing for Surgery  Before surgery, you can play an important role.  Because skin is not sterile, your skin needs to be as free of germs as possible.  You can reduce the number of germs on you skin by washing with CHG (chlorahexidine gluconate) soap before surgery.  CHG is an antiseptic cleaner which kills germs and bonds with the skin to continue killing germs even after washing.  Please DO NOT use if you have an allergy to CHG or antibacterial soaps.  If your skin becomes reddened/irritated stop using the CHG and inform your nurse when you arrive at Short Stay.  Do not shave (including legs and underarms) for at least 48 hours prior to the first CHG shower.  You may shave your face.  Please follow these instructions carefully:   1.  Shower with CHG Soap the night before surgery and the                                morning of Surgery.  2.  If you choose to wash your hair, wash your hair first as usual with your       normal shampoo.  3.  After you shampoo, rinse your hair and body thoroughly to remove the                      Shampoo.  4.  Use CHG as you would any other liquid soap.  You can apply chg directly       to the skin and wash gently with scrungie or a clean washcloth.  5.  Apply the CHG Soap to your body ONLY FROM THE NECK DOWN.        Do not use on open wounds or open sores.  Avoid contact with your eyes,       ears, mouth and genitals (private parts).  Wash genitals (private parts)       with your normal soap.  6.  Wash thoroughly, paying special attention to the area where your surgery        will be  performed.  7.  Thoroughly rinse your body with warm water from the neck down.  8.  DO NOT shower/wash with your normal soap after using and rinsing off       the CHG Soap.  9.  Pat yourself  dry with a clean towel.            10.  Wear clean pajamas.            11.  Place clean sheets on your bed the night of your first shower and do not        sleep with pets.  Day of Surgery  Do not apply any lotions/deoderants the morning of surgery.  Please wear clean clothes to the hospital/surgery center.     Please read over the following fact sheets that you were given. Pain Booklet, Coughing and Deep Breathing, MRSA Information and Surgical Site Infection Prevention

## 2016-09-30 ENCOUNTER — Encounter (HOSPITAL_COMMUNITY): Payer: Self-pay

## 2016-09-30 ENCOUNTER — Encounter (HOSPITAL_COMMUNITY)
Admission: RE | Admit: 2016-09-30 | Discharge: 2016-09-30 | Disposition: A | Discharge status: Home / Self Care | Payer: PPO | Source: Ambulatory Visit | Attending: Neurological Surgery | Admitting: Neurological Surgery

## 2016-09-30 DIAGNOSIS — Z01818 Encounter for other preprocedural examination: Secondary | ICD-10-CM | POA: Diagnosis not present

## 2016-09-30 DIAGNOSIS — Z01812 Encounter for preprocedural laboratory examination: Secondary | ICD-10-CM | POA: Insufficient documentation

## 2016-09-30 DIAGNOSIS — M48061 Spinal stenosis, lumbar region without neurogenic claudication: Secondary | ICD-10-CM | POA: Insufficient documentation

## 2016-09-30 DIAGNOSIS — Z95828 Presence of other vascular implants and grafts: Secondary | ICD-10-CM | POA: Diagnosis not present

## 2016-09-30 HISTORY — DX: Dyspnea, unspecified: R06.00

## 2016-09-30 HISTORY — DX: Personal history of urinary calculi: Z87.442

## 2016-09-30 HISTORY — DX: Personal history of other diseases of the digestive system: Z87.19

## 2016-09-30 HISTORY — DX: Personal history of peptic ulcer disease: Z87.11

## 2016-09-30 LAB — SURGICAL PCR SCREEN
MRSA, PCR: NEGATIVE
Staphylococcus aureus: NEGATIVE

## 2016-09-30 LAB — CBC WITH DIFFERENTIAL/PLATELET
Basophils Absolute: 0 10*3/uL (ref 0.0–0.1)
Basophils Relative: 0 %
Eosinophils Absolute: 0.5 10*3/uL (ref 0.0–0.7)
Eosinophils Relative: 5 %
HCT: 38.6 % (ref 36.0–46.0)
Hemoglobin: 12.2 g/dL (ref 12.0–15.0)
Lymphocytes Relative: 22 %
Lymphs Abs: 2 10*3/uL (ref 0.7–4.0)
MCH: 25.4 pg — ABNORMAL LOW (ref 26.0–34.0)
MCHC: 31.6 g/dL (ref 30.0–36.0)
MCV: 80.4 fL (ref 78.0–100.0)
Monocytes Absolute: 0.9 10*3/uL (ref 0.1–1.0)
Monocytes Relative: 10 %
Neutro Abs: 6 10*3/uL (ref 1.7–7.7)
Neutrophils Relative %: 63 %
Platelets: 322 10*3/uL (ref 150–400)
RBC: 4.8 MIL/uL (ref 3.87–5.11)
RDW: 14.4 % (ref 11.5–15.5)
WBC: 9.4 10*3/uL (ref 4.0–10.5)

## 2016-09-30 LAB — BASIC METABOLIC PANEL
Anion gap: 8 (ref 5–15)
BUN: 17 mg/dL (ref 6–20)
CO2: 26 mmol/L (ref 22–32)
Calcium: 10 mg/dL (ref 8.9–10.3)
Chloride: 104 mmol/L (ref 101–111)
Creatinine, Ser: 1 mg/dL (ref 0.44–1.00)
GFR calc Af Amer: >60 mL/min (ref >60)
GFR calc non Af Amer: 54 mL/min — ABNORMAL LOW (ref >60)
Glucose, Bld: 104 mg/dL — ABNORMAL HIGH (ref 65–99)
Potassium: 4.4 mmol/L (ref 3.5–5.1)
Sodium: 138 mmol/L (ref 135–145)

## 2016-09-30 LAB — GLUCOSE, CAPILLARY: Glucose-Capillary: 108 mg/dL — ABNORMAL HIGH (ref 65–99)

## 2016-09-30 LAB — PROTIME-INR
INR: 0.97
Prothrombin Time: 12.9 seconds (ref 11.4–15.2)

## 2016-09-30 LAB — TYPE AND SCREEN
ABO/RH(D): B POS
Antibody Screen: NEGATIVE

## 2016-09-30 LAB — ABO/RH: ABO/RH(D): B POS

## 2016-09-30 NOTE — Progress Notes (Signed)
PCP: Dr. Rennis Petty Cardiologist: [name]  Fasting sugars: 100-120

## 2016-10-01 LAB — HEMOGLOBIN A1C
Hgb A1c MFr Bld: 6.9 % — ABNORMAL HIGH (ref 4.8–5.6)
Mean Plasma Glucose: 151 mg/dL

## 2016-10-01 NOTE — Progress Notes (Addendum)
Anesthesia Chart Review:   Pt is a 76 year old female scheduled for L2-3, L3-4 laminectomy and foraminotomy, L2-4 instrumented fusion, removal of hardware L4-S1 10/09/2016 with Sherley Bounds, M.D.  - PCP is Henrine Screws, MD - Cardiologist is [name], MD, last office visit 06/12/16; follow up prn recommended.   PMH includes: HTN, DM, rapid heartbeat, DOE, DVT, GERD. Never smoker. BMI 33.  Medications include: ASA 81 mg, glipizide, Toujeo, losartan, metformin, metoprolol, rosuvastatin  Preoperative labs reviewed. HbA1c 6.9, glucose 104.  CXR 09/30/16: No active cardiopulmonary disease  EKG 06/12/16: NSR. Septal infarct, age undetermined. Appears stable dating back to at least 01/19/14 EKG  Nuclear stress test 03/30/16:  Nuclear stress EF: 76%.  There was no ST segment deviation noted during stress.  The study is normal.  This is a low risk study.  The left ventricular ejection fraction is hyperdynamic (>65%).  Cardiopulmonary exercise test 07/11/15: - Exercise testing with gas exchange demonstrates normal functional capacity when compared to matched sedentary norms. There are no obvious ventilatory or circulatory limitations to exercise, however there was a hypertensive response to the exercise. At peak exercise, patient appears primarily limited by her body habitus.   Echo 12/06/12 [facility] cardiology): 1. Mild concentric LVH. EF estimated at 60-65%. There were no regional wall motion abnormalities. 2. Mild tricuspid regurgitation. 3. Mild calcification of aortic valve. 4. Analysis of mitral valve inflow, pulmonary vein Doppler and tissue Doppler suggests grade I diastolic dysfunction without elevated LA pressure  If no changes, I anticipate pt can proceed with surgery as scheduled.   Willeen Cass, FNP-BC [facility]/Anesthesiology Phone: [phone] 10/01/2016 12:20 PM

## 2016-10-09 ENCOUNTER — Encounter (HOSPITAL_COMMUNITY): Payer: Self-pay | Admitting: Neurological Surgery

## 2016-10-09 ENCOUNTER — Inpatient Hospital Stay (HOSPITAL_COMMUNITY): Payer: PPO | Admitting: Anesthesiology

## 2016-10-09 ENCOUNTER — Inpatient Hospital Stay (HOSPITAL_COMMUNITY)
Admission: RE | Admit: 2016-10-09 | Discharge: 2016-10-11 | DRG: 460 | Disposition: A | Discharge status: Home / Self Care | Payer: PPO | Source: Ambulatory Visit | Attending: Neurological Surgery | Admitting: Neurological Surgery

## 2016-10-09 ENCOUNTER — Inpatient Hospital Stay (HOSPITAL_COMMUNITY)
Admission: RE | Disposition: A | Discharge status: Home / Self Care | Payer: Self-pay | Source: Ambulatory Visit | Attending: Neurological Surgery

## 2016-10-09 ENCOUNTER — Inpatient Hospital Stay (HOSPITAL_COMMUNITY): Payer: PPO | Admitting: Emergency Medicine

## 2016-10-09 ENCOUNTER — Inpatient Hospital Stay (HOSPITAL_COMMUNITY): Payer: PPO

## 2016-10-09 DIAGNOSIS — Z419 Encounter for procedure for purposes other than remedying health state, unspecified: Secondary | ICD-10-CM

## 2016-10-09 DIAGNOSIS — Z7982 Long term (current) use of aspirin: Secondary | ICD-10-CM

## 2016-10-09 DIAGNOSIS — Z8249 Family history of ischemic heart disease and other diseases of the circulatory system: Secondary | ICD-10-CM

## 2016-10-09 DIAGNOSIS — E7800 Pure hypercholesterolemia, unspecified: Secondary | ICD-10-CM | POA: Diagnosis not present

## 2016-10-09 DIAGNOSIS — E119 Type 2 diabetes mellitus without complications: Secondary | ICD-10-CM | POA: Diagnosis present

## 2016-10-09 DIAGNOSIS — Z86718 Personal history of other venous thrombosis and embolism: Secondary | ICD-10-CM

## 2016-10-09 DIAGNOSIS — Z981 Arthrodesis status: Secondary | ICD-10-CM

## 2016-10-09 DIAGNOSIS — K219 Gastro-esophageal reflux disease without esophagitis: Secondary | ICD-10-CM | POA: Diagnosis present

## 2016-10-09 DIAGNOSIS — M1990 Unspecified osteoarthritis, unspecified site: Secondary | ICD-10-CM | POA: Diagnosis not present

## 2016-10-09 DIAGNOSIS — Z90710 Acquired absence of both cervix and uterus: Secondary | ICD-10-CM

## 2016-10-09 DIAGNOSIS — I10 Essential (primary) hypertension: Secondary | ICD-10-CM | POA: Diagnosis present

## 2016-10-09 DIAGNOSIS — M4327 Fusion of spine, lumbosacral region: Secondary | ICD-10-CM | POA: Diagnosis not present

## 2016-10-09 DIAGNOSIS — Z833 Family history of diabetes mellitus: Secondary | ICD-10-CM

## 2016-10-09 DIAGNOSIS — Z794 Long term (current) use of insulin: Secondary | ICD-10-CM

## 2016-10-09 DIAGNOSIS — Z7951 Long term (current) use of inhaled steroids: Secondary | ICD-10-CM | POA: Diagnosis not present

## 2016-10-09 DIAGNOSIS — Z888 Allergy status to other drugs, medicaments and biological substances status: Secondary | ICD-10-CM

## 2016-10-09 DIAGNOSIS — Z96651 Presence of right artificial knee joint: Secondary | ICD-10-CM | POA: Diagnosis not present

## 2016-10-09 DIAGNOSIS — M79606 Pain in leg, unspecified: Secondary | ICD-10-CM | POA: Diagnosis not present

## 2016-10-09 DIAGNOSIS — M48061 Spinal stenosis, lumbar region without neurogenic claudication: Secondary | ICD-10-CM | POA: Diagnosis not present

## 2016-10-09 HISTORY — PX: SPINAL FUSION: SHX223

## 2016-10-09 LAB — GLUCOSE, CAPILLARY
Glucose-Capillary: 174 mg/dL — ABNORMAL HIGH (ref 65–99)
Glucose-Capillary: 101 mg/dL — ABNORMAL HIGH (ref 65–99)
Glucose-Capillary: 62 mg/dL — ABNORMAL LOW (ref 65–99)
Glucose-Capillary: 80 mg/dL (ref 65–99)
Glucose-Capillary: 94 mg/dL (ref 65–99)
Glucose-Capillary: 128 mg/dL — ABNORMAL HIGH (ref 65–99)

## 2016-10-09 SURGERY — POSTERIOR LUMBAR FUSION 2 LEVEL
Anesthesia: General | Site: Back

## 2016-10-09 MED ORDER — HYDROMORPHONE HCL 1 MG/ML IJ SOLN
0.2500 mg | INTRAMUSCULAR | Status: DC | PRN
  Administered 2016-10-09 (×4): 0.5 mg via INTRAVENOUS

## 2016-10-09 MED ORDER — POTASSIUM CHLORIDE IN NACL 20-0.9 MEQ/L-% IV SOLN
INTRAVENOUS | Status: DC
  Administered 2016-10-09: 14:00:00 via INTRAVENOUS
  Filled 2016-10-09 (×3): qty 1000

## 2016-10-09 MED ORDER — LIDOCAINE 2% (20 MG/ML) 5 ML SYRINGE
INTRAMUSCULAR | Status: AC
  Filled 2016-10-09: qty 5

## 2016-10-09 MED ORDER — THROMBIN 5000 UNITS EX SOLR
CUTANEOUS | Status: AC
  Filled 2016-10-09: qty 5000

## 2016-10-09 MED ORDER — SUCCINYLCHOLINE CHLORIDE 200 MG/10ML IV SOSY
PREFILLED_SYRINGE | INTRAVENOUS | Status: AC
  Filled 2016-10-09: qty 10

## 2016-10-09 MED ORDER — ESTRADIOL 1 MG PO TABS
1.0000 mg | ORAL_TABLET | Freq: Every day | ORAL | Status: DC
  Filled 2016-10-09 (×3): qty 1

## 2016-10-09 MED ORDER — ARTIFICIAL TEARS OPHTHALMIC OINT
TOPICAL_OINTMENT | OPHTHALMIC | Status: AC
  Filled 2016-10-09: qty 3.5

## 2016-10-09 MED ORDER — THROMBIN 20000 UNITS EX SOLR
CUTANEOUS | Status: AC
  Filled 2016-10-09: qty 20000

## 2016-10-09 MED ORDER — SODIUM CHLORIDE 0.9% FLUSH: 3.0000 mL | INTRAVENOUS | Status: DC | PRN

## 2016-10-09 MED ORDER — PHENOL 1.4 % MT LIQD
1.0000 | OROMUCOSAL | Status: DC | PRN
  Administered 2016-10-11: 1 via OROMUCOSAL
  Filled 2016-10-09: qty 177

## 2016-10-09 MED ORDER — ARTIFICIAL TEARS OPHTHALMIC OINT
TOPICAL_OINTMENT | OPHTHALMIC | Status: DC | PRN
  Administered 2016-10-09: 1 via OPHTHALMIC

## 2016-10-09 MED ORDER — CHLORHEXIDINE GLUCONATE CLOTH 2 % EX PADS: 6.0000 | MEDICATED_PAD | Freq: Once | CUTANEOUS | Status: DC

## 2016-10-09 MED ORDER — THROMBIN 5000 UNITS EX SOLR
OROMUCOSAL | Status: DC | PRN
  Administered 2016-10-09: 08:00:00 via TOPICAL

## 2016-10-09 MED ORDER — PHENYLEPHRINE HCL 10 MG/ML IJ SOLN
INTRAMUSCULAR | Status: DC | PRN
  Administered 2016-10-09: 45 ug/min via INTRAVENOUS

## 2016-10-09 MED ORDER — INSULIN ASPART 100 UNIT/ML ~~LOC~~ SOLN
0.0000 [IU] | Freq: Three times a day (TID) | SUBCUTANEOUS | Status: DC
  Administered 2016-10-10 (×2): 2 [IU] via SUBCUTANEOUS
  Administered 2016-10-10: 3 [IU] via SUBCUTANEOUS
  Administered 2016-10-11: 2 [IU] via SUBCUTANEOUS
  Administered 2016-10-11: 3 [IU] via SUBCUTANEOUS

## 2016-10-09 MED ORDER — THROMBIN 20000 UNITS EX SOLR
CUTANEOUS | Status: DC | PRN
  Administered 2016-10-09: 08:00:00 via TOPICAL

## 2016-10-09 MED ORDER — ACETAMINOPHEN 325 MG PO TABS: 650.0000 mg | ORAL_TABLET | ORAL | Status: DC | PRN

## 2016-10-09 MED ORDER — SODIUM CHLORIDE 0.9 % IR SOLN
Status: DC | PRN
  Administered 2016-10-09: 08:00:00

## 2016-10-09 MED ORDER — ONDANSETRON HCL 4 MG/2ML IJ SOLN: 4.0000 mg | Freq: Four times a day (QID) | INTRAMUSCULAR | Status: DC | PRN

## 2016-10-09 MED ORDER — IBUPROFEN 200 MG PO TABS: 200.0000 mg | ORAL_TABLET | Freq: Three times a day (TID) | ORAL | Status: DC | PRN

## 2016-10-09 MED ORDER — SUGAMMADEX SODIUM 200 MG/2ML IV SOLN
INTRAVENOUS | Status: DC | PRN
  Administered 2016-10-09: 200 mg via INTRAVENOUS

## 2016-10-09 MED ORDER — ONDANSETRON HCL 4 MG/2ML IJ SOLN
INTRAMUSCULAR | Status: AC
  Filled 2016-10-09: qty 2

## 2016-10-09 MED ORDER — EPHEDRINE 5 MG/ML INJ
INTRAVENOUS | Status: AC
  Filled 2016-10-09: qty 10

## 2016-10-09 MED ORDER — PROMETHAZINE HCL 25 MG/ML IJ SOLN: 6.2500 mg | INTRAMUSCULAR | Status: DC | PRN

## 2016-10-09 MED ORDER — ALBUMIN HUMAN 5 % IV SOLN
INTRAVENOUS | Status: DC | PRN
  Administered 2016-10-09: 10:00:00 via INTRAVENOUS

## 2016-10-09 MED ORDER — METOPROLOL SUCCINATE ER 25 MG PO TB24
50.0000 mg | ORAL_TABLET | Freq: Two times a day (BID) | ORAL | Status: DC
  Administered 2016-10-09 – 2016-10-11 (×4): 50 mg via ORAL
  Filled 2016-10-09 (×4): qty 2

## 2016-10-09 MED ORDER — METHOCARBAMOL 1000 MG/10ML IJ SOLN
500.0000 mg | Freq: Four times a day (QID) | INTRAVENOUS | Status: DC | PRN
  Filled 2016-10-09: qty 5

## 2016-10-09 MED ORDER — GLIPIZIDE 5 MG PO TABS
10.0000 mg | ORAL_TABLET | Freq: Two times a day (BID) | ORAL | Status: DC
  Administered 2016-10-09 – 2016-10-11 (×4): 10 mg via ORAL
  Filled 2016-10-09 (×4): qty 2

## 2016-10-09 MED ORDER — OXYCODONE-ACETAMINOPHEN 5-325 MG PO TABS
1.0000 | ORAL_TABLET | Freq: Four times a day (QID) | ORAL | Status: DC | PRN
  Administered 2016-10-09 (×2): 2 via ORAL
  Administered 2016-10-10: 1 via ORAL
  Administered 2016-10-10 – 2016-10-11 (×4): 2 via ORAL
  Filled 2016-10-09 (×6): qty 2

## 2016-10-09 MED ORDER — PROPOFOL 10 MG/ML IV BOLUS
INTRAVENOUS | Status: DC | PRN
  Administered 2016-10-09: 130 mg via INTRAVENOUS

## 2016-10-09 MED ORDER — METFORMIN HCL 500 MG PO TABS
1000.0000 mg | ORAL_TABLET | Freq: Two times a day (BID) | ORAL | Status: DC
  Administered 2016-10-09 – 2016-10-11 (×4): 1000 mg via ORAL
  Filled 2016-10-09 (×4): qty 2

## 2016-10-09 MED ORDER — SODIUM CHLORIDE 0.9 % IV SOLN: 250.0000 mL | INTRAVENOUS | Status: DC

## 2016-10-09 MED ORDER — MIDAZOLAM HCL 2 MG/2ML IJ SOLN
INTRAMUSCULAR | Status: AC
  Filled 2016-10-09: qty 2

## 2016-10-09 MED ORDER — SODIUM CHLORIDE 0.9% FLUSH
3.0000 mL | Freq: Two times a day (BID) | INTRAVENOUS | Status: DC
  Administered 2016-10-09 – 2016-10-10 (×3): 3 mL via INTRAVENOUS

## 2016-10-09 MED ORDER — METHOCARBAMOL 500 MG PO TABS
ORAL_TABLET | ORAL | Status: AC
  Filled 2016-10-09: qty 1

## 2016-10-09 MED ORDER — LIDOCAINE HCL (CARDIAC) 20 MG/ML IV SOLN
INTRAVENOUS | Status: DC | PRN
  Administered 2016-10-09: 80 mg via INTRAVENOUS

## 2016-10-09 MED ORDER — ROCURONIUM BROMIDE 10 MG/ML (PF) SYRINGE
PREFILLED_SYRINGE | INTRAVENOUS | Status: AC
  Filled 2016-10-09: qty 5

## 2016-10-09 MED ORDER — MIDAZOLAM HCL 5 MG/5ML IJ SOLN
INTRAMUSCULAR | Status: DC | PRN
  Administered 2016-10-09: 2 mg via INTRAVENOUS

## 2016-10-09 MED ORDER — CEFAZOLIN SODIUM-DEXTROSE 2-4 GM/100ML-% IV SOLN
INTRAVENOUS | Status: AC
  Filled 2016-10-09: qty 100

## 2016-10-09 MED ORDER — ASPIRIN EC 81 MG PO TBEC
81.0000 mg | DELAYED_RELEASE_TABLET | Freq: Every day | ORAL | Status: DC
  Administered 2016-10-09 – 2016-10-11 (×3): 81 mg via ORAL
  Filled 2016-10-09 (×3): qty 1

## 2016-10-09 MED ORDER — MORPHINE SULFATE (PF) 2 MG/ML IV SOLN
2.0000 mg | INTRAVENOUS | Status: DC | PRN
  Administered 2016-10-09 – 2016-10-11 (×8): 2 mg via INTRAVENOUS
  Filled 2016-10-09 (×8): qty 1

## 2016-10-09 MED ORDER — ACETAMINOPHEN 650 MG RE SUPP: 650.0000 mg | RECTAL | Status: DC | PRN

## 2016-10-09 MED ORDER — HYDROMORPHONE HCL 1 MG/ML IJ SOLN
INTRAMUSCULAR | Status: AC
  Filled 2016-10-09: qty 1

## 2016-10-09 MED ORDER — OXYCODONE-ACETAMINOPHEN 5-325 MG PO TABS
ORAL_TABLET | ORAL | Status: AC
Start: 2016-10-09 — End: 2016-10-10
  Filled 2016-10-09: qty 2

## 2016-10-09 MED ORDER — METHOCARBAMOL 500 MG PO TABS
500.0000 mg | ORAL_TABLET | Freq: Four times a day (QID) | ORAL | Status: DC | PRN
  Administered 2016-10-09 – 2016-10-11 (×3): 500 mg via ORAL
  Filled 2016-10-09 (×2): qty 1

## 2016-10-09 MED ORDER — CEFAZOLIN SODIUM-DEXTROSE 2-4 GM/100ML-% IV SOLN
2.0000 g | INTRAVENOUS | Status: AC
  Administered 2016-10-09: 2 g via INTRAVENOUS
  Filled 2016-10-09: qty 100

## 2016-10-09 MED ORDER — FENTANYL CITRATE (PF) 100 MCG/2ML IJ SOLN
INTRAMUSCULAR | Status: DC | PRN
  Administered 2016-10-09 (×6): 50 ug via INTRAVENOUS
  Administered 2016-10-09: 100 ug via INTRAVENOUS

## 2016-10-09 MED ORDER — SUCCINYLCHOLINE CHLORIDE 20 MG/ML IJ SOLN
INTRAMUSCULAR | Status: DC | PRN
  Administered 2016-10-09: 100 mg via INTRAVENOUS

## 2016-10-09 MED ORDER — LOSARTAN POTASSIUM 50 MG PO TABS
100.0000 mg | ORAL_TABLET | Freq: Every evening | ORAL | Status: DC
  Administered 2016-10-09 – 2016-10-10 (×2): 100 mg via ORAL
  Filled 2016-10-09 (×2): qty 2

## 2016-10-09 MED ORDER — ROCURONIUM BROMIDE 100 MG/10ML IV SOLN
INTRAVENOUS | Status: DC | PRN
  Administered 2016-10-09: 30 mg via INTRAVENOUS
  Administered 2016-10-09: 50 mg via INTRAVENOUS
  Administered 2016-10-09: 20 mg via INTRAVENOUS

## 2016-10-09 MED ORDER — BUPIVACAINE HCL (PF) 0.25 % IJ SOLN
INTRAMUSCULAR | Status: DC | PRN
  Administered 2016-10-09: 10 mL

## 2016-10-09 MED ORDER — SENNA 8.6 MG PO TABS
1.0000 | ORAL_TABLET | Freq: Two times a day (BID) | ORAL | Status: DC
Start: 2016-10-09 — End: 2016-10-11
  Administered 2016-10-09 – 2016-10-11 (×4): 8.6 mg via ORAL
  Filled 2016-10-09 (×4): qty 1

## 2016-10-09 MED ORDER — SUGAMMADEX SODIUM 200 MG/2ML IV SOLN
INTRAVENOUS | Status: AC
  Filled 2016-10-09: qty 2

## 2016-10-09 MED ORDER — 0.9 % SODIUM CHLORIDE (POUR BTL) OPTIME
TOPICAL | Status: DC | PRN
  Administered 2016-10-09: 1000 mL

## 2016-10-09 MED ORDER — LACTATED RINGERS IV SOLN
INTRAVENOUS | Status: DC | PRN
  Administered 2016-10-09 (×2): via INTRAVENOUS

## 2016-10-09 MED ORDER — FENTANYL CITRATE (PF) 250 MCG/5ML IJ SOLN
INTRAMUSCULAR | Status: AC
  Filled 2016-10-09: qty 10

## 2016-10-09 MED ORDER — BUPIVACAINE HCL (PF) 0.25 % IJ SOLN
INTRAMUSCULAR | Status: AC
  Filled 2016-10-09: qty 30

## 2016-10-09 MED ORDER — FLUTICASONE FUROATE-VILANTEROL 100-25 MCG/INH IN AEPB
2.0000 | INHALATION_SPRAY | Freq: Every day | RESPIRATORY_TRACT | Status: DC
  Administered 2016-10-11: 11:00:00 2 via RESPIRATORY_TRACT
  Filled 2016-10-09: qty 28

## 2016-10-09 MED ORDER — ONDANSETRON HCL 4 MG/2ML IJ SOLN
INTRAMUSCULAR | Status: DC | PRN
  Administered 2016-10-09: 4 mg via INTRAVENOUS

## 2016-10-09 MED ORDER — CEFAZOLIN SODIUM-DEXTROSE 2-4 GM/100ML-% IV SOLN
2.0000 g | Freq: Three times a day (TID) | INTRAVENOUS | Status: AC
  Administered 2016-10-09 (×2): 2 g via INTRAVENOUS
  Filled 2016-10-09: qty 100

## 2016-10-09 MED ORDER — ONDANSETRON HCL 4 MG PO TABS: 4.0000 mg | ORAL_TABLET | Freq: Four times a day (QID) | ORAL | Status: DC | PRN

## 2016-10-09 MED ORDER — MENTHOL 3 MG MT LOZG: 1.0000 | LOZENGE | OROMUCOSAL | Status: DC | PRN

## 2016-10-09 MED ORDER — EPHEDRINE SULFATE 50 MG/ML IJ SOLN
INTRAMUSCULAR | Status: DC | PRN
  Administered 2016-10-09 (×2): 10 mg via INTRAVENOUS

## 2016-10-09 SURGICAL SUPPLY — 65 items
BAG DECANTER FOR FLEXI CONT (MISCELLANEOUS) ×2 IMPLANT
BASKET BONE COLLECTION (BASKET) ×2 IMPLANT
BENZOIN TINCTURE PRP APPL 2/3 (GAUZE/BANDAGES/DRESSINGS) ×2 IMPLANT
BLADE CLIPPER SURG (BLADE) IMPLANT
BONE CANC CHIPS 40CC CAN1/2 (Bone Implant) ×2 IMPLANT
BUR MATCHSTICK NEURO 3.0 LAGG (BURR) ×2 IMPLANT
CANISTER SUCT 3000ML PPV (MISCELLANEOUS) ×2 IMPLANT
CARTRIDGE OIL MAESTRO DRILL (MISCELLANEOUS) ×1 IMPLANT
CHIPS CANC BONE 40CC CAN1/2 (Bone Implant) ×1 IMPLANT
CONT SPEC 4OZ CLIKSEAL STRL BL (MISCELLANEOUS) ×2 IMPLANT
COVER BACK TABLE 60X90IN (DRAPES) ×2 IMPLANT
DERMABOND ADVANCED (GAUZE/BANDAGES/DRESSINGS) ×1
DERMABOND ADVANCED .7 DNX12 (GAUZE/BANDAGES/DRESSINGS) ×1 IMPLANT
DIFFUSER DRILL AIR PNEUMATIC (MISCELLANEOUS) ×2 IMPLANT
DRAPE C-ARM 42X72 X-RAY (DRAPES) ×4 IMPLANT
DRAPE LAPAROTOMY 100X72X124 (DRAPES) ×2 IMPLANT
DRAPE POUCH INSTRU U-SHP 10X18 (DRAPES) ×2 IMPLANT
DRAPE SURG 17X23 STRL (DRAPES) ×2 IMPLANT
DRSG OPSITE POSTOP 4X8 (GAUZE/BANDAGES/DRESSINGS) ×2 IMPLANT
DURAPREP 26ML APPLICATOR (WOUND CARE) ×2 IMPLANT
ELECT REM PT RETURN 9FT ADLT (ELECTROSURGICAL) ×2
ELECTRODE REM PT RTRN 9FT ADLT (ELECTROSURGICAL) ×1 IMPLANT
EVACUATOR 1/8 PVC DRAIN (DRAIN) IMPLANT
GAUZE SPONGE 4X4 16PLY XRAY LF (GAUZE/BANDAGES/DRESSINGS) IMPLANT
GLOVE BIO SURGEON STRL SZ7 (GLOVE) ×2 IMPLANT
GLOVE BIO SURGEON STRL SZ8 (GLOVE) ×4 IMPLANT
GLOVE BIOGEL PI IND STRL 6 (GLOVE) ×3 IMPLANT
GLOVE BIOGEL PI IND STRL 7.0 (GLOVE) ×3 IMPLANT
GLOVE BIOGEL PI IND STRL 7.5 (GLOVE) ×3 IMPLANT
GLOVE BIOGEL PI INDICATOR 6 (GLOVE) ×3
GLOVE BIOGEL PI INDICATOR 7.0 (GLOVE) ×3
GLOVE BIOGEL PI INDICATOR 7.5 (GLOVE) ×3
GOWN STRL REUS W/ TWL LRG LVL3 (GOWN DISPOSABLE) ×3 IMPLANT
GOWN STRL REUS W/ TWL XL LVL3 (GOWN DISPOSABLE) ×2 IMPLANT
GOWN STRL REUS W/TWL 2XL LVL3 (GOWN DISPOSABLE) IMPLANT
GOWN STRL REUS W/TWL LRG LVL3 (GOWN DISPOSABLE) ×3
GOWN STRL REUS W/TWL XL LVL3 (GOWN DISPOSABLE) ×2
HEMOSTAT POWDER KIT SURGIFOAM (HEMOSTASIS) ×2 IMPLANT
KIT BASIN OR (CUSTOM PROCEDURE TRAY) ×2 IMPLANT
KIT ROOM TURNOVER OR (KITS) ×2 IMPLANT
MILL MEDIUM DISP (BLADE) ×2 IMPLANT
NEEDLE HYPO 25X1 1.5 SAFETY (NEEDLE) ×2 IMPLANT
NS IRRIG 1000ML POUR BTL (IV SOLUTION) ×2 IMPLANT
OIL CARTRIDGE MAESTRO DRILL (MISCELLANEOUS) ×2
PACK LAMINECTOMY NEURO (CUSTOM PROCEDURE TRAY) ×2 IMPLANT
PAD ARMBOARD 7.5X6 YLW CONV (MISCELLANEOUS) ×6 IMPLANT
PUTTY KINEX BIACTIVE P 10CC (Putty) ×2 IMPLANT
ROD SOLERA 55MM (Rod) ×4 IMPLANT
SCREW 5.5X35MM (Screw) ×4 IMPLANT
SCREW BN 35X5.5XMA NS SPNE (Screw) ×4 IMPLANT
SCREW SET SOLERA (Screw) ×5 IMPLANT
SCREW SET SOLERA TI (Screw) ×5 IMPLANT
SCREW SOLERA 45X6.5XMA NS SPN (Screw) ×2 IMPLANT
SCREW SOLERA 6.5X45MM (Screw) ×2 IMPLANT
SPONGE LAP 4X18 X RAY DECT (DISPOSABLE) IMPLANT
SPONGE SURGIFOAM ABS GEL 100 (HEMOSTASIS) ×2 IMPLANT
STRIP CLOSURE SKIN 1/2X4 (GAUZE/BANDAGES/DRESSINGS) ×2 IMPLANT
SUT VIC AB 0 CT1 18XCR BRD8 (SUTURE) ×2 IMPLANT
SUT VIC AB 0 CT1 8-18 (SUTURE) ×2
SUT VIC AB 2-0 CP2 18 (SUTURE) ×4 IMPLANT
SUT VIC AB 3-0 SH 8-18 (SUTURE) ×6 IMPLANT
TOWEL GREEN STERILE (TOWEL DISPOSABLE) ×2 IMPLANT
TOWEL GREEN STERILE FF (TOWEL DISPOSABLE) ×2 IMPLANT
TRAY FOLEY W/METER SILVER 16FR (SET/KITS/TRAYS/PACK) ×2 IMPLANT
WATER STERILE IRR 1000ML POUR (IV SOLUTION) ×2 IMPLANT

## 2016-10-09 NOTE — H&P (Signed)
Subjective: Patient is a 76 y.o. female admitted for back surgery. Onset of symptoms was several months ago, gradually worsening since that time.  The pain is rated severe, and is located at the across the lower back and radiates to legs. The pain is described as aching and occurs all day. The symptoms have been progressive. Symptoms are exacerbated by exercise. MRI or CT showed stenosis   Past Medical History:  Diagnosis Date  . Anemia   . Arthritis   . Diabetes mellitus   . DVT (deep venous thrombosis) ([place])   . Dyspnea    on exertion  . GERD (gastroesophageal reflux disease)   . History of kidney stones   . History of stomach ulcers   . Hypertension    dr Marlou Porch  . Rapid heartbeat     Past Surgical History:  Procedure Laterality Date  . ABDOMINAL HYSTERECTOMY  1981  . APPENDECTOMY    . ARTHOSCOPIC ROTAOR CUFF REPAIR Left 2010 or 2011  . BACK SURGERY  x 2 1999 and 2008   lower back L 4 to L 5 L5 to S 1  . BREAST BIOPSY Bilateral 05/18/1997  . CARPAL TUNNEL RELEASE Right 2014  . CARPAL TUNNEL RELEASE Bilateral   . COLONOSCOPY WITH PROPOFOL N/A 10/25/2012   Procedure: COLONOSCOPY WITH PROPOFOL;  Surgeon: [name], MD;  Location: [facility];  Service: Endoscopy;  Laterality: N/A;  . JOINT REPLACEMENT Right 2012   knee  . ovarian wedge resection  1966  . SPINAL CORD STIMULATOR INSERTION N/A 11/03/2012   Procedure: LUMBAR SPINAL CORD STIMULATOR INSERTION;  Surgeon: [name], MD;  Location: [place];  Service: Orthopedics;  Laterality: N/A;  . TONSILLECTOMY  1960    Prior to Admission medications   Medication Sig Start Date End Date Taking? Authorizing Provider  aspirin EC 81 MG tablet Take 81 mg by mouth daily.   Yes [provider]  B Complex-C (SUPER B COMPLEX PO) Take 1 tablet by mouth every evening.    Yes [provider]  Biotin 5000 MCG TABS Take 5,000 mg by mouth every evening.    Yes [provider]  Calcium-Magnesium-Vitamin D  (CALCIUM 1200+D3 PO) Take 1 tablet by mouth every evening.   Yes [provider]  Co-Enzyme Q10 200 MG CAPS Take 200 mg by mouth every evening.   Yes [provider]  estradiol (ESTRACE) 1 MG tablet Take 1 mg by mouth at bedtime.    Yes [provider]  fluticasone furoate-vilanterol (BREO ELLIPTA) 100-25 MCG/INH AEPB Inhale 2 puffs into the lungs daily.    Yes [provider]  glipiZIDE (GLUCOTROL) 10 MG tablet Take 10 mg by mouth 2 (two) times daily. 08/03/16  Yes [provider]  HYDROcodone-acetaminophen (NORCO) 7.5-325 MG per tablet Take 1 tablet by mouth 3 (three) times daily as needed (for pain.).  12/26/13  Yes [provider]  ibuprofen (ADVIL,MOTRIN) 200 MG tablet Take 200-600 mg by mouth every 8 (eight) hours as needed (for pain.).   Yes [provider]  Insulin Glargine (TOUJEO SOLOSTAR) 300 UNIT/ML SOPN Inject 60 Units into the skin daily.    Yes [provider]  Insulin Pen Needle (BD PEN NEEDLE NANO U/F) 32G X 4 MM MISC Inject 1 Device into the skin daily.  04/04/14  Yes [provider]  losartan (COZAAR) 100 MG tablet Take 100 mg by mouth every evening.    Yes [provider]  Magnesium 500 MG TABS Take 1,500  mg by mouth every evening.    Yes [provider]  metFORMIN (GLUCOPHAGE) 1000 MG tablet Take 1,000 mg by mouth 2 (two) times daily.    Yes [provider]  metoprolol succinate (TOPROL-XL) 50 MG 24 hr tablet Take 1 tablet (50 mg total) by mouth 2 (two) times daily. 05/20/15  Yes Jerline Pain, MD  Multiple Vitamins-Minerals (MULTIVITAMIN WITH MINERALS) tablet Take 1 tablet by mouth every evening.    Yes [provider]  Omega-3 Fatty Acids (FISH OIL) 1200 MG CAPS Take 1,200 mg by mouth 2 (two) times daily.   Yes [provider]  oxyCODONE-acetaminophen (PERCOCET/ROXICET) 5-325 MG tablet Take 1-2 tablets by mouth every 6 (six) hours as needed for pain. 09/15/16   Yes [provider]  Polyethyl Glycol-Propyl Glycol (LUBRICANT EYE DROPS) 0.4-0.3 % SOLN Place 1 drop into both eyes 3 (three) times daily as needed (for dry eyes).   Yes [provider]  rosuvastatin (CRESTOR) 10 MG tablet Take 10 mg by mouth every evening.    Yes [provider]   Allergies  Allergen Reactions  . Hydrochlorothiazide Anaphylaxis    comatose  . Ace Inhibitors Cough  . Ezetimibe-Simvastatin Other (See Comments)    MYALGIA  . Indomethacin Other (See Comments)    PEPTIC ULCERS  . Lisinopril Cough  . Lyrica [Pregabalin] Other (See Comments)    Weight gain, drowsy  . Prednisone Itching, Swelling and Other (See Comments)    Facial redness/swelling   . Simvastatin Other (See Comments)    Elevated LFT's  . Vioxx [Rofecoxib] Other (See Comments)    Anxiety & chills  . Troglitazone Other (See Comments)    UNSPECIFIED REACTION  Pt is unaware of reaction.  Marland Kitchen Antihistamines, Chlorpheniramine-Type Other (See Comments)    "HYPER"    Social History  Substance Use Topics  . Smoking status: Never Smoker  . Smokeless tobacco: Never Used  . Alcohol use No    Family History  Problem Relation Age of Onset  . Heart disease Father   . Diabetes Mother      Review of Systems  Positive ROS: neg  All other systems have been reviewed and were otherwise negative with the exception of those mentioned in the HPI and as above.  Objective: Vital signs in last 24 hours:    General Appearance: Alert, cooperative, no distress, appears stated age Head: Normocephalic, without obvious abnormality, atraumatic Eyes: PERRL, conjunctiva/corneas clear, EOM's intact    Neck: Supple, symmetrical, trachea midline Back: Symmetric, no curvature, ROM normal, no CVA tenderness Lungs:  respirations unlabored Heart: Regular rate and rhythm Abdomen: Soft, non-tennegder Extremities: Extremities normal, atraumatic, no cyanosis or edema Pulses: 2+ and symmetric all  extremities Skin: Skin color, texture, turgor normal, no rashes or lesions  NEUROLOGIC:   Mental status: Alert and oriented x4,  no aphasia, good attention span, fund of knowledge, and memory Motor Exam - grossly normal Sensory Exam - grossly normal Reflexes: 1+ Coordination - grossly normal Gait - grossly normal Balance - grossly normal Cranial Nerves: I: smell Not tested  II: visual acuity  OS: nl    OD: nl  II: visual fields Full to confrontation  II: pupils Equal, round, reactive to light  III,VII: ptosis None  III,IV,VI: extraocular muscles  Full ROM  V: mastication Normal  V: facial light touch sensation  Normal  V,VII: corneal reflex  Present  VII: facial muscle function - upper  Normal  VII: facial muscle function - lower Normal  VIII: hearing Not tested  IX: soft palate elevation  Normal  IX,X: gag reflex Present  XI: trapezius strength  5/5  XI: sternocleidomastoid strength 5/5  XI: neck flexion strength  5/5  XII: tongue strength  Normal    Data Review Lab Results  Component Value Date   WBC 9.4 09/30/2016   HGB 12.2 09/30/2016   HCT 38.6 09/30/2016   MCV 80.4 09/30/2016   PLT 322 09/30/2016   Lab Results  Component Value Date   NA 138 09/30/2016   K 4.4 09/30/2016   CL 104 09/30/2016   CO2 26 09/30/2016   BUN 17 09/30/2016   CREATININE 1.00 09/30/2016   GLUCOSE 104 (H) 09/30/2016   Lab Results  Component Value Date   INR 0.97 09/30/2016    Assessment/Plan: Patient admitted for LL/ Fusion L2-4. Patient has failed a reasonable attempt at conservative therapy.  I explained the condition and procedure to the patient and answered any questions.  Patient wishes to proceed with procedure as planned. Understands risks/ benefits and typical outcomes of procedure.   Winnie Barsky S 10/09/2016 6:42 AM

## 2016-10-09 NOTE — Op Note (Signed)
10/09/2016  11:29 AM  PATIENT:  [name]  76 y.o. female  PRE-OPERATIVE DIAGNOSIS: Adjacent level stenosis L2-3 and L3-4, previous L4-S1 fusion, back and leg pain  POST-OPERATIVE DIAGNOSIS:  same  PROCEDURE:   1. Decompressive lumbar laminectomy, medial facetectomy and foraminotomies L2-3 L3-4  in order to adequately decompress the neural elements and address the spinal stenosis 2. Posterior fixation L2-L4 inclusive using [employer] cortical pedicle screws.  3. Intertransverse arthrodesis L2-3 to L4 bilaterally using morcellized autograft and allograft. 4. Removal of segmental instrumentation L4-S1  SURGEON:  Sherley Bounds, MD  ASSISTANTS: Dr Cyndy Freeze  ANESTHESIA:  General  EBL: 100 ml  Total I/O In: 7893 [I.V.:2000; IV Piggyback:250] Out: 600 [Urine:500; Blood:100]  BLOOD ADMINISTERED:none  DRAINS: none   INDICATION FOR PROCEDURE: This patient presented with back and leg pain. She had had a previous instrument infusion L4-S1 in the remote past. Imaging revealed adjacent level stenosis L2-3 L3-4. The patient tried a reasonable attempt at conservative medical measures without relief. I recommended decompression and instrumented fusion to address the stenosis as well as the segment once stability.  Patient understood the risks, benefits, and alternatives and potential outcomes and wished to proceed.  PROCEDURE DETAILS:  The patient was brought to the operating room. After induction of generalized endotracheal anesthesia the patient was rolled into the prone position on chest rolls and all pressure points were padded. The patient's lumbar region was cleaned and then prepped with DuraPrep and draped in the usual sterile fashion. Anesthesia was injected and then a dorsal midline incision was made and carried down to the lumbosacral fascia. The fascia was opened and the paraspinous musculature was taken down in a subperiosteal fashion to expose L2-L4 as well as the previously placed  instrumentation. I was able to remove the locking caps from the previous placed instrumentation and then removed the cross-link and the rods. I then removed all 6 pedicle screws. A self-retaining retractor was placed. Intraoperative fluoroscopy confirmed my level, and I started with placement of the L2 cortical pedicle screws. The pedicle screw entry zones were identified utilizing surface landmarks and  AP and lateral fluoroscopy. I scored the cortex with the high-speed drill and then used the hand drill to drill an upward and outward direction into the pedicle. I then tapped line to line, and the tap was also monitored. I then placed a 5.5 x 35 mm cortical pedicle screw into the pedicles of L2 bilaterally. I then turned my attention to the decompression and the spinous processes of L2 and L3 were removed and complete lumbar laminectomies, hemi- facetectomies, and foraminotomies were performed at L2-3 and L3-4. The patient had significant spinal stenosis  Much more generous decompression was undertaken in order to adequately decompress the neural elements and address the patient's leg pain. The yellow ligament was removed to expose the underlying dura and nerve roots, and generous foraminotomies were performed to adequately decompress the neural elements. Both the exiting and traversing nerve roots were decompressed on both sides until a coronary dilator passed easily along the nerve roots. . We then turned our attention to the placement of the lower pedicle screws. The L4 pedicle screws were replaced with 6.5 x 45 mm pedicle screws. The pedicle screw entry zones of L3 were identified utilizing surface landmarks and fluoroscopy. I drilled into each pedicle utilizing the hand drill  and tapped each pedicle with the appropriate tap. We palpated with a ball probe to assure no break in the cortex. We then placed 5.5  x 35 mm into the pedicles bilaterally at L3. Because the previously placed L4 screws were lateral to  medial and the cortical screws or medial to lateral was difficult to make the L3 pedicle screw lateral with the L4 pedicle screw on the right. Therefore the patient got segmental instrumentation on the left and nonsegmental instrumentation on the right as the right L3 screw was removed.. We then decorticated the transverse processes and laid a mixture of morcellized autograft and allograft out over these to perform intertransverse arthrodesis at L2-L4. We then placed lordotic rods into the multiaxial screw heads of the pedicle screws and locked these in position with the locking caps and anti-torque device. We then checked our construct with AP and lateral fluoroscopy. Irrigated with copious amounts of bacitracin-containing saline solution. Inspected the nerve roots once again to assure adequate decompression, lined to the dura with Gelfoam, and closed the muscle and the fascia with 0 Vicryl. Closed the subcutaneous tissues with 2-0 Vicryl and subcuticular tissues with 3-0 Vicryl. The skin was closed with benzoin and Steri-Strips. Dressing was then applied, the patient was awakened from general anesthesia and transported to the recovery room in stable condition. At the end of the procedure all sponge, needle and instrument counts were correct.   PLAN OF CARE: admit to inpatient  PATIENT DISPOSITION:  PACU - hemodynamically stable.   Delay start of Pharmacological VTE agent (>24hrs) due to surgical blood loss or risk of bleeding:  yes

## 2016-10-09 NOTE — Anesthesia Postprocedure Evaluation (Signed)
Anesthesia Post Note  Patient: [name]  Procedure(s) Performed: Procedure(s) (LRB): Laminectomy and Foraminotomy Lumbar two -lumbar three, Lumbar three-Lumbar four Instrumented fusion lumbar two to lumbar four   removal of hardware Lumbar four-Sacral one (N/A)     Patient location during evaluation: PACU Anesthesia Type: General Level of consciousness: awake, sedated and oriented Pain management: pain level controlled Vital Signs Assessment: post-procedure vital signs reviewed and stable Respiratory status: spontaneous breathing, nonlabored ventilation, respiratory function stable and patient connected to nasal cannula oxygen Cardiovascular status: blood pressure returned to baseline and stable Postop Assessment: no signs of nausea or vomiting Anesthetic complications: no    Last Vitals:  Vitals:   10/09/16 1345 10/09/16 1354  BP:    Pulse: 88 85  Resp: 13 16  Temp:      Last Pain:  Vitals:   10/09/16 1354  TempSrc:   PainSc: 5                  Gorgeous Newlun,JAMES TERRILL

## 2016-10-09 NOTE — Anesthesia Procedure Notes (Signed)
Procedure Name: Intubation Date/Time: 10/09/2016 7:47 AM Performed by: Jacquiline Doe A Pre-anesthesia Checklist: Patient identified, Emergency Drugs available, Suction available and Patient being monitored Patient Re-evaluated:Patient Re-evaluated prior to inductionOxygen Delivery Method: Circle System Utilized and Circle system utilized Preoxygenation: Pre-oxygenation with 100% oxygen Intubation Type: IV induction and Cricoid Pressure applied Ventilation: Mask ventilation without difficulty Laryngoscope Size: Mac and 3 Grade View: Grade II Tube type: Oral Tube size: 7.5 mm Number of attempts: 2 Airway Equipment and Method: Stylet Placement Confirmation: ETT inserted through vocal cords under direct vision,  positive ETCO2 and breath sounds checked- equal and bilateral Secured at: 22 cm Tube secured with: Tape Dental Injury: Teeth and Oropharynx as per pre-operative assessment

## 2016-10-09 NOTE — Transfer of Care (Signed)
Immediate Anesthesia Transfer of Care Note  Patient: [name]  Procedure(s) Performed: Procedure(s): Laminectomy and Foraminotomy Lumbar two -lumbar three, Lumbar three-Lumbar four Instrumented fusion lumbar two to lumbar four   removal of hardware Lumbar four-Sacral one (N/A)  Patient Location: PACU  Anesthesia Type:General  Level of Consciousness: awake, oriented, sedated, patient cooperative and responds to stimulation  Airway & Oxygen Therapy: Patient Spontanous Breathing and Patient connected to nasal cannula oxygen  Post-op Assessment: Report given to RN, Post -op Vital signs reviewed and stable, Patient moving all extremities and Patient moving all extremities X 4  Post vital signs: Reviewed and stable  Last Vitals:  Vitals:   10/09/16 0706  BP: 133/64  Pulse: 73  Resp: 14  Temp: 36.7 C    Last Pain:  Vitals:   10/09/16 0706  TempSrc: Oral  PainSc: 6          Complications: No apparent anesthesia complications

## 2016-10-09 NOTE — Progress Notes (Signed)
Orthopedic Tech Progress Note Patient Details:  [name] 21-Sep-1940 [id number] Patient has brace. Patient ID: [name], female   DOB: 03/31/1941, 76 y.o.   MRN: [id number]   Braulio Bosch 10/09/2016, 5:54 PM

## 2016-10-09 NOTE — Anesthesia Preprocedure Evaluation (Addendum)
Anesthesia Evaluation  Patient identified by MRN, date of birth, ID band Patient awake    Reviewed: Allergy & Precautions, NPO status , Patient's Chart, lab work & pertinent test results  Airway Mallampati: II   Neck ROM: Full    Dental no notable dental hx.    Pulmonary shortness of breath,    breath sounds clear to auscultation       Cardiovascular hypertension,  Rhythm:Regular Rate:Normal     Neuro/Psych negative neurological ROS     GI/Hepatic GERD  ,  Endo/Other  diabetesMorbid obesity  Renal/GU      Musculoskeletal  (+) Arthritis ,   Abdominal   Peds  Hematology  (+) anemia ,   Anesthesia Other Findings   Reproductive/Obstetrics                            Anesthesia Physical Anesthesia Plan  ASA: III  Anesthesia Plan: General   Post-op Pain Management:    Induction: Intravenous  Airway Management Planned: Oral ETT  Additional Equipment:   Intra-op Plan:   Post-operative Plan: Extubation in OR  Informed Consent:   Plan Discussed with:   Anesthesia Plan Comments:         Anesthesia Quick Evaluation

## 2016-10-09 NOTE — Progress Notes (Signed)
New Admission Note:  Arrival Method: wheelchair; from PACU  Mental Orientation: alert & oriented x 4 Telemetry: Assessment: Completed Skin: surgical incision on back  Safety Measures: Safety Fall Prevention Plan was given, discussed. Admission: Completed 513: Patient has been orientated to the room, unit and the staff. Family:visiting   Orders have been reviewed and implemented. Will continue to monitor the patient. Call light has been placed within reach and bed alarm has been activated.   Arta Silence ,RN

## 2016-10-10 LAB — GLUCOSE, CAPILLARY
Glucose-Capillary: 144 mg/dL — ABNORMAL HIGH (ref 65–99)
Glucose-Capillary: 169 mg/dL — ABNORMAL HIGH (ref 65–99)
Glucose-Capillary: 158 mg/dL — ABNORMAL HIGH (ref 65–99)
Glucose-Capillary: 147 mg/dL — ABNORMAL HIGH (ref 65–99)

## 2016-10-10 MED ORDER — ALUM & MAG HYDROXIDE-SIMETH 200-200-20 MG/5ML PO SUSP
30.0000 mL | Freq: Four times a day (QID) | ORAL | Status: DC | PRN
  Administered 2016-10-10 – 2016-10-11 (×3): 30 mL via ORAL
  Filled 2016-10-10 (×4): qty 30

## 2016-10-10 MED ORDER — SODIUM CHLORIDE 0.9% FLUSH
3.0000 mL | INTRAVENOUS | Status: DC | PRN
  Administered 2016-10-10: 3 mL via INTRAVENOUS

## 2016-10-10 MED ORDER — SODIUM CHLORIDE 0.9% FLUSH
3.0000 mL | Freq: Two times a day (BID) | INTRAVENOUS | Status: DC
  Administered 2016-10-10 – 2016-10-11 (×2): 3 mL via INTRAVENOUS

## 2016-10-10 NOTE — Plan of Care (Signed)
Problem: Activity: Goal: Ability to avoid complications of mobility impairment will improve Outcome: Progressing Patient ambulated safely in hallway free of falls this shift.   Problem: Pain Management: Goal: Pain level will decrease Outcome: Progressing Patient pain managed with prn iv morphine and po percocet this shift. Patient satisfied with pain management.

## 2016-10-10 NOTE — Evaluation (Signed)
Physical Therapy Evaluation Patient Details Name: [name] MRN: [id number] DOB: 04-Mar-1941 Today's Date: 10/10/2016   History of Present Illness  Pt is a 76 yo female s/p lumbar L2-4 laminectomy and decompression and posterior fixation. Prior to admission pt was complaining of constant back pain radiating into B legs.   Clinical Impression  Pt limited by pain and decreased activity tolerance. Pt agreeable to gait and stairs with PT, but pt was very guarded throughout session due to pain. Pt ambulated 267ft with RW and supervision and completed 3 stairs with rails and min Jarman Litton assistance. PT provided education about spinal precautions and safety with mobility/transfers, safety with car transfers, and activity expectations. Pt would benefit from continued skilled acute PT to improve activity tolerance and functional mobility. No follow up PT recommended upon d/c home.     Follow Up Recommendations No PT follow up;Supervision - Intermittent    Equipment Recommendations  None recommended by PT    Recommendations for Other Services       Precautions / Restrictions Precautions Precautions: Back Precaution Booklet Issued: Yes (comment) Precaution Comments: reviewed spinal precautions  Required Braces or Orthoses: Spinal Brace Spinal Brace: Lumbar corset Restrictions Weight Bearing Restrictions: No      Mobility  Bed Mobility Overal bed mobility: Needs Assistance Bed Mobility: Sidelying to Sit   Sidelying to sit: Supervision       General bed mobility comments: VCs for log rolling sequence.   Transfers Overall transfer level: Needs assistance Equipment used: Rolling walker (2 wheeled) Transfers: Sit to/from Stand Sit to Stand: Supervision         General transfer comment: Pt required increased time for sit to stand. VC for hand placement with RW  Ambulation/Gait Ambulation/Gait assistance: Supervision Ambulation Distance (Feet): 200 Feet Assistive device:  Rolling walker (2 wheeled) Gait Pattern/deviations: Step-through pattern;Decreased stride length;Trunk flexed Gait velocity: decreased Gait velocity interpretation: Below normal speed for age/gender General Gait Details: Pt very guarded during mobility due to pain. VCs to increase cadence and stride length   Stairs Stairs: Yes Stairs assistance: Min Shelitha Magley Stair Management: Two rails;Step to pattern;Forwards Number of Stairs: 3 General stair comments: Min Tanesia Butner for safety.  Wheelchair Mobility    Modified Rankin (Stroke Patients Only)       Balance Overall balance assessment: Needs assistance Sitting-balance support: No upper extremity supported;Feet supported Sitting balance-Leahy Scale: Good     Standing balance support: Bilateral upper extremity supported;During functional activity Standing balance-Leahy Scale: Poor                               Pertinent Vitals/Pain Pain Assessment: 0-10 Pain Score: 8  Pain Location: low back  Pain Descriptors / Indicators: Aching;Discomfort;Sore Pain Intervention(s): Monitored during session;Premedicated before session    [place] expects to be discharged to:: Private residence Living Arrangements: Spouse/significant other Available Help at Discharge: Family Type of Home: House Home Access: Stairs to enter Entrance Stairs-Rails: Can reach both Entrance Stairs-Number of Steps: 3 Home Layout: Two level;Able to live on main level with bedroom/bathroom Home Equipment: Gilford Rile - 2 wheels;Cane - single point;Bedside commode;Grab bars - toilet;Grab bars - tub/shower      Prior Function Level of Independence: Independent with assistive device(s)         Comments: Pt used cane for mobility in the community      Hand Dominance   Dominant Hand: Right    Extremity/Trunk Assessment   Upper  Extremity Assessment Upper Extremity Assessment: Defer to OT evaluation    Lower Extremity  Assessment Lower Extremity Assessment: Overall WFL for tasks assessed       Communication   Communication: No difficulties  Cognition Arousal/Alertness: Awake/alert Behavior During Therapy: WFL for tasks assessed/performed Overall Cognitive Status: Within Functional Limits for tasks assessed                                        General Comments General comments (skin integrity, edema, etc.): PT provided education about adhering to spinal precautions, safety during mobility, car transfers, and activity expectations.     Exercises     Assessment/Plan    PT Assessment Patient needs continued PT services  PT Problem List Decreased strength;Decreased range of motion;Decreased activity tolerance;Decreased balance;Decreased mobility;Decreased knowledge of use of DME;Decreased safety awareness;Decreased knowledge of precautions;Pain       PT Treatment Interventions DME instruction;Gait training;Stair training;Functional mobility training;Therapeutic activities;Therapeutic exercise;Balance training;Patient/family education    PT Goals (Current goals can be found in the Care Plan section)  Acute Rehab PT Goals Patient Stated Goal: to go home  PT Goal Formulation: With patient/family Time For Goal Achievement: 10/24/16 Potential to Achieve Goals: Good    Frequency Min 5X/week   Barriers to discharge        Co-evaluation               AM-PAC PT "6 Clicks" Daily Activity  Outcome Measure Difficulty turning over in bed (including adjusting bedclothes, sheets and blankets)?: Total Difficulty moving from lying on back to sitting on the side of the bed? : Total Difficulty sitting down on and standing up from a chair with arms (e.g., wheelchair, bedside commode, etc,.)?: A Little Help needed moving to and from a bed to chair (including a wheelchair)?: A Little Help needed walking in hospital room?: A Little Help needed climbing 3-5 steps with a railing? : A  Little 6 Click Score: 14    End of Session Equipment Utilized During Treatment: Gait belt;Back brace Activity Tolerance: Patient limited by pain Patient left: in chair;with call bell/phone within reach;with family/visitor present Nurse Communication: Mobility status PT Visit Diagnosis: Difficulty in walking, not elsewhere classified (R26.2);Pain Pain - part of body:  (low back )    Time: 8891-6945 PT Time Calculation (min) (ACUTE ONLY): 21 min   Charges:   PT Evaluation $PT Eval Moderate Complexity: 1 Procedure     PT G CodesLoma Sousa, SPT  [phone]  Loma Sousa 10/10/2016, 9:59 AM

## 2016-10-10 NOTE — Evaluation (Signed)
Occupational Therapy Evaluation and Discharge Patient Details Name: [name] MRN: [id number] DOB: 1941-02-15 Today's Date: 10/10/2016    History of Present Illness Pt is a 76 yo female s/p lumbar L2-4 laminectomy and decompression and posterior fixation. Prior to admission pt was complaining of constant back pain radiating into B legs.    Clinical Impression   PTA Pt independent in ADL and mobility. Pt currently mod A for LB ADL and supervision for mobility with RW. Back handout provided and reviewed adls in detail. Pt educated on: clothing between brace, never sleep in brace, set an alarm at night for medication, avoid sitting for long periods of time, correct bed positioning for sleeping, correct sequence for bed mobility, avoiding lifting more than 5 pounds, AE kit in entirety, and never wash directly over incision. All education is complete and patient indicates understanding. OT to sign off thank you for this referral.       Follow Up Recommendations  No OT follow up;Supervision - Intermittent    Equipment Recommendations  None recommended by OT    Recommendations for Other Services       Precautions / Restrictions Precautions Precautions: Back Precaution Booklet Issued: Yes (comment) Precaution Comments: reviewed spinal precautions  Required Braces or Orthoses: Spinal Brace Spinal Brace: Lumbar corset Restrictions Weight Bearing Restrictions: No      Mobility Bed Mobility Overal bed mobility: Needs Assistance Bed Mobility: Sidelying to Sit   Sidelying to sit: Supervision       General bed mobility comments: VCs for log rolling sequence.   Transfers Overall transfer level: Needs assistance Equipment used: Rolling walker (2 wheeled) Transfers: Sit to/from Stand Sit to Stand: Supervision         General transfer comment: Pt required increased time for sit to stand. VC for hand placement with RW    Balance Overall balance assessment: Needs  assistance Sitting-balance support: No upper extremity supported;Feet supported Sitting balance-Leahy Scale: Good     Standing balance support: Bilateral upper extremity supported;During functional activity Standing balance-Leahy Scale: Poor                             ADL either performed or assessed with clinical judgement   ADL Overall ADL's : Needs assistance/impaired Eating/Feeding: Set up;Sitting   Grooming: Min guard;Standing   Upper Body Bathing: Minimal assistance;With caregiver independent assisting   Lower Body Bathing: Moderate assistance;With caregiver independent assisting;With adaptive equipment;Sit to/from stand   Upper Body Dressing : Minimal assistance;Sitting Upper Body Dressing Details (indicate cue type and reason): to don brace Lower Body Dressing: Moderate assistance;With caregiver independent assisting;With adaptive equipment;Sit to/from stand   Toilet Transfer: Supervision/safety;RW;Ambulation   Toileting- Water quality scientist and Hygiene: Supervision/safety   Tub/ Banker: Min guard;With caregiver independent assisting   Functional mobility during ADLs: Supervision/safety;Rolling walker General ADL Comments: Pt educated and practiced with AE kit (grabber/reacher, long handle sponge, sock donner, long handle shoe horn, toileting aide)     Vision Patient Visual Report: No change from baseline Vision Assessment?: No apparent visual deficits     Perception     Praxis      Pertinent Vitals/Pain Pain Assessment: 0-10 Pain Score: 8  Pain Location: low back  Pain Descriptors / Indicators: Aching;Discomfort;Sore Pain Intervention(s): Monitored during session;Limited activity within patient's tolerance;Repositioned;Patient requesting pain meds-RN notified     Hand Dominance Right   Extremity/Trunk Assessment Upper Extremity Assessment Upper Extremity Assessment: Overall WFL for tasks assessed  Lower Extremity  Assessment Lower Extremity Assessment: Defer to PT evaluation   Cervical / Trunk Assessment Cervical / Trunk Assessment: Other exceptions Cervical / Trunk Exceptions: s/p surgery   Communication Communication Communication: No difficulties   Cognition Arousal/Alertness: Awake/alert Behavior During Therapy: WFL for tasks assessed/performed Overall Cognitive Status: Within Functional Limits for tasks assessed                                     General Comments       Exercises     Shoulder Instructions      Home Living Family/patient expects to be discharged to:: Private residence Living Arrangements: Spouse/significant other Available Help at Discharge: Family;Available 24 hours/day Type of Home: House Home Access: Stairs to enter [employer] of Steps: 3 Entrance Stairs-Rails: Can reach both Home Layout: Two level;Able to live on main level with bedroom/bathroom Alternate Level Stairs-Number of Steps:  (does not use 2nd floor )   Bathroom Shower/Tub: Tub/shower unit;Walk-in shower   Bathroom Toilet: Standard     Home Equipment: Environmental consultant - 2 wheels;Cane - single point;Bedside commode;Grab bars - toilet;Grab bars - tub/shower;Hand held shower head;Adaptive equipment Adaptive Equipment: Reacher;Long-handled shoe horn        Prior Functioning/Environment Level of Independence: Independent with assistive device(s)        Comments: Pt used cane for mobility in the community         OT Problem List: Decreased range of motion;Decreased activity tolerance;Impaired balance (sitting and/or standing);Decreased safety awareness;Decreased knowledge of use of DME or AE;Decreased knowledge of precautions;Pain      OT Treatment/Interventions:      OT Goals(Current goals can be found in the care plan section) Acute Rehab OT Goals Patient Stated Goal: to go home  OT Goal Formulation: With patient Time For Goal Achievement: 10/24/16 Potential to  Achieve Goals: Good  OT Frequency:     Barriers to D/C:            Co-evaluation              AM-PAC PT "6 Clicks" Daily Activity     Outcome Measure Help from another person eating meals?: None Help from another person taking care of personal grooming?: A Little Help from another person toileting, which includes using toliet, bedpan, or urinal?: A Little Help from another person bathing (including washing, rinsing, drying)?: A Lot Help from another person to put on and taking off regular upper body clothing?: A Little Help from another person to put on and taking off regular lower body clothing?: A Lot 6 Click Score: 17   End of Session Equipment Utilized During Treatment: Gait belt;Back brace;Rolling walker Nurse Communication: Mobility status;Patient requests pain meds  Activity Tolerance: Patient tolerated treatment well Patient left: in bed;with call bell/phone within reach;with bed alarm set  OT Visit Diagnosis: Unsteadiness on feet (R26.81);Pain Pain - Right/Left: Right Pain - part of body: Leg (back)                Time: 5631-4970 OT Time Calculation (min): 15 min Charges:  OT General Charges $OT Visit: 1 Procedure OT Evaluation $OT Eval Moderate Complexity: 1 Procedure G-Codes:     Hulda Humphrey OTR/L [place] 10/10/2016, 1:33 PM

## 2016-10-10 NOTE — Progress Notes (Signed)
Patient urinary catheter removed at 0600 patient due to void by noon will continue with care and update oncoming nurse.

## 2016-10-10 NOTE — Progress Notes (Signed)
Patient ID: [name], female   DOB: 20-Aug-1940, 76 y.o.   MRN: [id number] Subjective:  The patient is alert and pleasant. Her back is appropriately sore.  Objective: Vital signs in last 24 hours: Temp:  [97 F (36.1 C)-99.1 F (37.3 C)] 99.1 F (37.3 C) (06/02 7867) Pulse Rate:  [70-108] 100 (06/02 0608) Resp:  [10-20] 20 (06/02 0608) BP: (119-139)/(56-69) 119/61 (06/02 0608) SpO2:  [95 %-100 %] 98 % (06/02 0608) Weight:  [100.2 kg (220 lb 14.4 oz)] 100.2 kg (220 lb 14.4 oz) (06/01 1625)  Intake/Output from previous day: 06/01 0701 - 06/02 0700 In: 3210 [P.O.:340; I.V.:2620; IV Piggyback:250] Out: 2700 [Urine:2550; Blood:150] Intake/Output this shift: Total I/O In: 120 [P.O.:120] Out: 1350 [Urine:1350]  Physical exam the patient is alert and oriented. She is moving her lower extremities well.  Lab Results: No results for input(s): WBC, HGB, HCT, PLT in the last 72 hours. BMET No results for input(s): NA, K, CL, CO2, GLUCOSE, BUN, CREATININE, CALCIUM in the last 72 hours.  Studies/Results: Dg Lumbar Spine 2-3 Views  Result Date: 10/09/2016 CLINICAL DATA:  76 year old female status post laminectomy and foraminotomy at L2-L3 and L3-L4. Fusion L2-L4. Removal of hardware at L4-S1. EXAM: LUMBAR SPINE - 2-3 VIEW; DG C-ARM 61-120 MIN COMPARISON:  Lumbar spine radiographs 09/10/2016. FINDINGS: 2 intraoperative fluoroscopic spot views of the lumbar spine are submitted for evaluation. These demonstrate a new posterior rod and pedicle screw fixation device in place, presumably at L2-L4 (lumbar spine is incompletely visualized). Alignment appears anatomic. No acute complicating features. Electronic device is noted projecting over the lower lumbar region, presumably a spinal cord stimulator. IMPRESSION: 1. Intraoperative documentation of PLIF at L2-L4, as above. Electronically Signed   By: Vinnie Langton M.D.   On: 10/09/2016 11:08   Dg C-arm 1-60 Min  Result Date: 10/09/2016 CLINICAL  DATA:  76 year old female status post laminectomy and foraminotomy at L2-L3 and L3-L4. Fusion L2-L4. Removal of hardware at L4-S1. EXAM: LUMBAR SPINE - 2-3 VIEW; DG C-ARM 61-120 MIN COMPARISON:  Lumbar spine radiographs 09/10/2016. FINDINGS: 2 intraoperative fluoroscopic spot views of the lumbar spine are submitted for evaluation. These demonstrate a new posterior rod and pedicle screw fixation device in place, presumably at L2-L4 (lumbar spine is incompletely visualized). Alignment appears anatomic. No acute complicating features. Electronic device is noted projecting over the lower lumbar region, presumably a spinal cord stimulator. IMPRESSION: 1. Intraoperative documentation of PLIF at L2-L4, as above. Electronically Signed   By: Vinnie Langton M.D.   On: 10/09/2016 11:08    Assessment/Plan: Postop day #1: The patient is not ready to go home. We will mobilize her. She may go home tomorrow.  LOS: 1 day     Eddie Payette D 10/10/2016, 6:49 AM

## 2016-10-11 LAB — GLUCOSE, CAPILLARY
Glucose-Capillary: 129 mg/dL — ABNORMAL HIGH (ref 65–99)
Glucose-Capillary: 180 mg/dL — ABNORMAL HIGH (ref 65–99)

## 2016-10-11 MED ORDER — HYDROCODONE-ACETAMINOPHEN 7.5-325 MG PO TABS: 1.0000 | ORAL_TABLET | ORAL | 0 refills | Status: DC | PRN

## 2016-10-11 MED ORDER — METHOCARBAMOL 500 MG PO TABS: 500.0000 mg | ORAL_TABLET | Freq: Four times a day (QID) | ORAL | 1 refills | Status: DC | PRN

## 2016-10-11 NOTE — Progress Notes (Signed)
Patient had a large amount of drainage over night, required bandage change and steri strips still in place but not attached well. Patient in good spirits ambulated in hall and is sitting in chair. Small amount of drainage noted after ambulation.

## 2016-10-11 NOTE — Progress Notes (Signed)
Physical Therapy Treatment Patient Details Name: [name] MRN: [id number] DOB: 1940/05/30 Today's Date: 10/11/2016    History of Present Illness Pt is a 76 yo female s/p lumbar L2-4 laminectomy and decompression and posterior fixation. Prior to admission pt was complaining of constant back pain radiating into B legs.     PT Comments    Pt demonstrated improved activity tolerance and decreased pain during today's session. Pt completed bed mobility and transfers with modified independence for increased time. Pt ambulated 29ft with RW and supervision for safety. Pt able to recall 3/3 spinal precautions and demonstrates good safety awareness. Pt's husband very supportive and can provide assistance upon d/c home. Current plan of care remains appropriate.    Follow Up Recommendations  No PT follow up;Supervision - Intermittent     Equipment Recommendations  None recommended by PT    Recommendations for Other Services       Precautions / Restrictions Precautions Precautions: Back Required Braces or Orthoses: Spinal Brace Spinal Brace: Lumbar corset Restrictions Weight Bearing Restrictions: No    Mobility  Bed Mobility Overal bed mobility: Needs Assistance Bed Mobility: Sidelying to Sit   Sidelying to sit: Modified independent (Device/Increase time)       General bed mobility comments: Pt demonstrated proper log rolling sequence. Use of bed rails and increased time for log rolling.   Transfers Overall transfer level: Needs assistance Equipment used: None Transfers: Sit to/from Stand Sit to Stand: Modified independent (Device/Increase time)         General transfer comment: Increased time for sit to stand due to pain.   Ambulation/Gait Ambulation/Gait assistance: Supervision Ambulation Distance (Feet): 250 Feet Assistive device: Rolling walker (2 wheeled) Gait Pattern/deviations: Step-through pattern;Decreased stride length;Trunk flexed   Gait velocity  interpretation: Below normal speed for age/gender General Gait Details: Pt demonstrated improved cadence and able to maintain steady gait speed.    Stairs            Wheelchair Mobility    Modified Rankin (Stroke Patients Only)       Balance Overall balance assessment: Needs assistance Sitting-balance support: No upper extremity supported;Feet supported Sitting balance-Leahy Scale: Good     Standing balance support: Bilateral upper extremity supported;During functional activity Standing balance-Leahy Scale: Poor                              Cognition Arousal/Alertness: Awake/alert Behavior During Therapy: WFL for tasks assessed/performed Overall Cognitive Status: Within Functional Limits for tasks assessed                                        Exercises      General Comments General comments (skin integrity, edema, etc.): Pt's husband present for therapy session, and able to assist pt with donning lumbar brace      Pertinent Vitals/Pain Pain Assessment: Faces Faces Pain Scale: Hurts even more Pain Location: low back Pain Descriptors / Indicators: Aching;Discomfort;Grimacing;Sore Pain Intervention(s): Monitored during session    Home Living                      Prior Function            PT Goals (current goals can now be found in the care plan section) Acute Rehab PT Goals Patient Stated Goal: to go home PT Goal Formulation: With  patient/family Time For Goal Achievement: 10/24/16 Potential to Achieve Goals: Good Progress towards PT goals: Progressing toward goals    Frequency    Min 5X/week      PT Plan Current plan remains appropriate    Co-evaluation              AM-PAC PT "6 Clicks" Daily Activity  Outcome Measure  Difficulty turning over in bed (including adjusting bedclothes, sheets and blankets)?: A Little Difficulty moving from lying on back to sitting on the side of the bed? : A  Little Difficulty sitting down on and standing up from a chair with arms (e.g., wheelchair, bedside commode, etc,.)?: A Little Help needed moving to and from a bed to chair (including a wheelchair)?: A Little Help needed walking in hospital room?: A Little Help needed climbing 3-5 steps with a railing? : A Little 6 Click Score: 18    End of Session Equipment Utilized During Treatment: Gait belt;Back brace Activity Tolerance: Patient tolerated treatment well Patient left: in bed;with call bell/phone within reach;with bed alarm set;with family/visitor present Nurse Communication: Mobility status PT Visit Diagnosis: Other abnormalities of gait and mobility (R26.89);Difficulty in walking, not elsewhere classified (R26.2)     Time: 7001-7494 PT Time Calculation (min) (ACUTE ONLY): 17 min  Charges:  $Gait Training: 8-22 mins                    G Codes:       Loma Sousa, SPT  [phone]   Loma Sousa 10/11/2016, 10:18 AM

## 2016-10-11 NOTE — Progress Notes (Signed)
Some bloody drainage from wound overnight No obvious hematoma Changed bandage, ok for d/c

## 2016-10-11 NOTE — Discharge Summary (Signed)
Physician Discharge Summary  Patient ID: [name] MRN: [id number] DOB/AGE: 1940/07/18 76 y.o.  Admit date: 10/09/2016 Discharge date: 10/11/2016  Admission Diagnoses:  Lumbar stenosis  Discharge Diagnoses:  Same Active Problems:   S/P lumbar spinal fusion   Discharged Condition: Stable  Hospital Course:  [name] is a 76 y.o. female who was admitted for the below procedure. Did she have some bleeding from surgical site, but resolved at discharge. Discussed care of wound. At time of discharge, pain was well controlled, ambulating with Pt/OT, tolerating po, voiding normal. Ready for discharge.  Treatments: Surgery -  1. Decompressive lumbar laminectomy, medial facetectomy and foraminotomies L2-3 L3-4  in order to adequately decompress the neural elements and address the spinal stenosis 2. Posterior fixation L2-L4 inclusive using Med[employer]onic cortical pedicle screws.  3. Intertransverse arthrodesis L2-3 to L4 bilaterally using morcellized autograft and allograft. 4. Removal of segmental instrumentation L4-S1  Discharge Exam: Blood pressure 131/68, pulse 91, temperature 98.7 F (37.1 C), temperature source Oral, resp. rate 20, height 5\' 6"  (1.676 m), weight 100.2 kg (220 lb 14.4 oz), SpO2 97 %. Awake, alert, oriented Speech fluent, appropriate CN grossly intact 5/5 BUE/BLE Wound c/d/i  Disposition: 01-Home or Self Care  Discharge Instructions    Call MD for:  difficulty breathing, headache or visual disturbances    Complete by:  As directed    Call MD for:  persistant dizziness or light-headedness    Complete by:  As directed    Call MD for:  redness, tenderness, or signs of infection (pain, swelling, redness, odor or green/yellow discharge around incision site)    Complete by:  As directed    Call MD for:  severe uncontrolled pain    Complete by:  As directed    Call MD for:  temperature >100.4    Complete by:  As directed    Diet general    Complete by:  As  directed    Driving Restrictions    Complete by:  As directed    Do not drive until given clearance.   Increase activity slowly    Complete by:  As directed    Lifting restrictions    Complete by:  As directed    Do not lift anything >10lbs. Avoid bending and twisting in awkward positions. Avoid bending at the back.     Allergies as of 10/11/2016      Reactions   Hydrochlorothiazide Anaphylaxis   comatose   Ace Inhibitors Cough   Ezetimibe-simvastatin Other (See Comments)   MYALGIA   Indomethacin Other (See Comments)   PEPTIC ULCERS   Lisinopril Cough   Lyrica [pregabalin] Other (See Comments)   Weight gain, drowsy   Prednisone Itching, Swelling, Other (See Comments)   Facial redness/swelling    Simvastatin Other (See Comments)   Elevated LFT's   Vioxx [rofecoxib] Other (See Comments)   Anxiety & chills   Troglitazone Other (See Comments)   UNSPECIFIED REACTION  Pt is unaware of reaction.   Antihistamines, Chlorpheniramine-type Other (See Comments)   "HYPER"      Medication List    STOP taking these medications   oxyCODONE-acetaminophen 5-325 MG tablet Commonly known as:  PERCOCET/ROXICET     TAKE these medications   aspirin EC 81 MG tablet Take 81 mg by mouth daily.   BD PEN NEEDLE NANO U/F 32G X 4 MM Misc Generic drug:  Insulin Pen Needle Inject 1 Device into the skin daily.   Biotin 5000 MCG Tabs Take  5,000 mg by mouth every evening.   BREO ELLIPTA 100-25 MCG/INH Aepb Generic drug:  fluticasone furoate-vilanterol Inhale 2 puffs into the lungs daily.   CALCIUM 1200+D3 PO Take 1 tablet by mouth every evening.   Co-Enzyme Q10 200 MG Caps Take 200 mg by mouth every evening.   estradiol 1 MG tablet Commonly known as:  ESTRACE Take 1 mg by mouth at bedtime.   Fish Oil 1200 MG Caps Take 1,200 mg by mouth 2 (two) times daily.   glipiZIDE 10 MG tablet Commonly known as:  GLUCOTROL Take 10 mg by mouth 2 (two) times daily.   HYDROcodone-acetaminophen  7.5-325 MG tablet Commonly known as:  NORCO Take 1-2 tablets by mouth every 4 (four) hours as needed (for pain.). What changed:  how much to take  when to take this   ibuprofen 200 MG tablet Commonly known as:  ADVIL,MOTRIN Take 200-600 mg by mouth every 8 (eight) hours as needed (for pain.).   losartan 100 MG tablet Commonly known as:  COZAAR Take 100 mg by mouth every evening.   LUBRICANT EYE DROPS 0.4-0.3 % Soln Generic drug:  Polyethyl Glycol-Propyl Glycol Place 1 drop into both eyes 3 (three) times daily as needed (for dry eyes).   Magnesium 500 MG Tabs Take 1,500 mg by mouth every evening.   metFORMIN 1000 MG tablet Commonly known as:  GLUCOPHAGE Take 1,000 mg by mouth 2 (two) times daily.   methocarbamol 500 MG tablet Commonly known as:  ROBAXIN Take 1 tablet (500 mg total) by mouth every 6 (six) hours as needed for muscle spasms.   metoprolol succinate 50 MG 24 hr tablet Commonly known as:  TOPROL-XL Take 1 tablet (50 mg total) by mouth 2 (two) times daily.   multivitamin with minerals tablet Take 1 tablet by mouth every evening.   rosuvastatin 10 MG tablet Commonly known as:  CRESTOR Take 10 mg by mouth every evening.   SUPER B COMPLEX PO Take 1 tablet by mouth every evening.   TOUJEO SOLOSTAR 300 UNIT/ML Sopn Generic drug:  Insulin Glargine Inject 60 Units into the skin daily.            Durable Medical Equipment        Start     Ordered   10/09/16 1625  DME Walker rolling  Once    Question:  Patient needs a walker to treat with the following condition  Answer:  S/P lumbar fusion   10/09/16 1624   10/09/16 1625  DME 3 n 1  Once     10/09/16 1624       Signed: Cindra Presume J 10/11/2016, 10:06 AM

## 2016-10-11 NOTE — Progress Notes (Signed)
Patient is discharged from room 5C13 at this time. Alert and in stable condition. IV site d/c'd and instructions read to patient and husband with understanding verbalized. Left unit via wheelchair with all belongings at side.

## 2016-10-12 MED FILL — Sodium Chloride IV Soln 0.9%: INTRAVENOUS | Qty: 1000 | Status: AC

## 2016-10-12 MED FILL — Heparin Sodium (Porcine) Inj 1000 Unit/ML: INTRAMUSCULAR | Qty: 30 | Status: AC

## 2016-10-21 DIAGNOSIS — L509 Urticaria, unspecified: Secondary | ICD-10-CM | POA: Diagnosis not present

## 2016-10-21 DIAGNOSIS — Z85828 Personal history of other malignant neoplasm of skin: Secondary | ICD-10-CM | POA: Diagnosis not present

## 2016-10-23 DIAGNOSIS — L509 Urticaria, unspecified: Secondary | ICD-10-CM | POA: Diagnosis not present

## 2016-10-23 DIAGNOSIS — Z794 Long term (current) use of insulin: Secondary | ICD-10-CM | POA: Diagnosis not present

## 2016-10-23 DIAGNOSIS — F411 Generalized anxiety disorder: Secondary | ICD-10-CM | POA: Diagnosis not present

## 2016-10-23 DIAGNOSIS — E1165 Type 2 diabetes mellitus with hyperglycemia: Secondary | ICD-10-CM | POA: Diagnosis not present

## 2016-10-26 ENCOUNTER — Other Ambulatory Visit: Payer: Self-pay

## 2016-10-26 NOTE — Patient Outreach (Signed)
[facility]) Care Management  10/26/2016  [name] [birth year] [id number]   Telephone Screen  Referral Date: 10/23/16 Referral Source: Nurse Call Center Report Referral Reason: "caller states after back surgery she has had issues with hives. She was told carrot juice would help. Now her blood sugar is 220. She wants to know if the juice could be causing her blood sugar to rise. Hives are still present and have been for over a week." Insurance: HTA   Outreach attempt # 1 to patient. Spoke with patient. States she is doing fair. She went to see PCP on last week for f/u appt and discussed her issues with MD. She reports MD told her elevated cbgs were probably related to steroid usage. She has completed steroids and was told to wait a week until med out of her system for her to see a change in blood sugars. She voices that hives are "mostly gone away." She was prescribed med to take to help which appears to be working. She denies any RN CM needs or concerns at this time. She has supportive spouse in the home. No issues or concerns with meds. Patient aware she can call 24 hr Nurse Line again if needed. She voiced understanding and was appreciative of call.       Plan: RN CM will notify [facility] administrative assistant of case status.   Enzo Montgomery, RN,BSN,CCM [facility] Telephonic Care Management Coordinator Direct Phone: [phone] Toll Free: [phone] Fax: [phone]

## 2016-11-10 DIAGNOSIS — Z79890 Hormone replacement therapy (postmenopausal): Secondary | ICD-10-CM | POA: Diagnosis not present

## 2016-11-10 DIAGNOSIS — M5416 Radiculopathy, lumbar region: Secondary | ICD-10-CM | POA: Diagnosis not present

## 2016-11-10 DIAGNOSIS — F411 Generalized anxiety disorder: Secondary | ICD-10-CM | POA: Diagnosis not present

## 2016-11-10 DIAGNOSIS — L509 Urticaria, unspecified: Secondary | ICD-10-CM | POA: Diagnosis not present

## 2016-11-10 DIAGNOSIS — E1165 Type 2 diabetes mellitus with hyperglycemia: Secondary | ICD-10-CM | POA: Diagnosis not present

## 2016-11-10 DIAGNOSIS — R600 Localized edema: Secondary | ICD-10-CM | POA: Diagnosis not present

## 2016-11-13 DIAGNOSIS — M1712 Unilateral primary osteoarthritis, left knee: Secondary | ICD-10-CM | POA: Diagnosis not present

## 2016-11-23 DIAGNOSIS — N390 Urinary tract infection, site not specified: Secondary | ICD-10-CM | POA: Diagnosis not present

## 2016-11-23 DIAGNOSIS — R319 Hematuria, unspecified: Secondary | ICD-10-CM | POA: Diagnosis not present

## 2016-11-23 DIAGNOSIS — R350 Frequency of micturition: Secondary | ICD-10-CM | POA: Diagnosis not present

## 2016-11-24 DIAGNOSIS — M48062 Spinal stenosis, lumbar region with neurogenic claudication: Secondary | ICD-10-CM | POA: Diagnosis not present

## 2016-11-26 DIAGNOSIS — R350 Frequency of micturition: Secondary | ICD-10-CM | POA: Diagnosis not present

## 2016-11-30 DIAGNOSIS — M9903 Segmental and somatic dysfunction of lumbar region: Secondary | ICD-10-CM | POA: Diagnosis not present

## 2016-11-30 DIAGNOSIS — M62830 Muscle spasm of back: Secondary | ICD-10-CM | POA: Diagnosis not present

## 2016-11-30 DIAGNOSIS — M5442 Lumbago with sciatica, left side: Secondary | ICD-10-CM | POA: Diagnosis not present

## 2016-11-30 DIAGNOSIS — M5441 Lumbago with sciatica, right side: Secondary | ICD-10-CM | POA: Diagnosis not present

## 2016-12-01 DIAGNOSIS — M5441 Lumbago with sciatica, right side: Secondary | ICD-10-CM | POA: Diagnosis not present

## 2016-12-01 DIAGNOSIS — M9903 Segmental and somatic dysfunction of lumbar region: Secondary | ICD-10-CM | POA: Diagnosis not present

## 2016-12-01 DIAGNOSIS — M5442 Lumbago with sciatica, left side: Secondary | ICD-10-CM | POA: Diagnosis not present

## 2016-12-01 DIAGNOSIS — M62830 Muscle spasm of back: Secondary | ICD-10-CM | POA: Diagnosis not present

## 2016-12-03 DIAGNOSIS — M9903 Segmental and somatic dysfunction of lumbar region: Secondary | ICD-10-CM | POA: Diagnosis not present

## 2016-12-03 DIAGNOSIS — M5441 Lumbago with sciatica, right side: Secondary | ICD-10-CM | POA: Diagnosis not present

## 2016-12-03 DIAGNOSIS — M5442 Lumbago with sciatica, left side: Secondary | ICD-10-CM | POA: Diagnosis not present

## 2016-12-03 DIAGNOSIS — M62830 Muscle spasm of back: Secondary | ICD-10-CM | POA: Diagnosis not present

## 2016-12-04 DIAGNOSIS — R350 Frequency of micturition: Secondary | ICD-10-CM | POA: Diagnosis not present

## 2016-12-04 DIAGNOSIS — N201 Calculus of ureter: Secondary | ICD-10-CM | POA: Diagnosis not present

## 2016-12-14 DIAGNOSIS — M5441 Lumbago with sciatica, right side: Secondary | ICD-10-CM | POA: Diagnosis not present

## 2016-12-14 DIAGNOSIS — M9903 Segmental and somatic dysfunction of lumbar region: Secondary | ICD-10-CM | POA: Diagnosis not present

## 2016-12-14 DIAGNOSIS — M62830 Muscle spasm of back: Secondary | ICD-10-CM | POA: Diagnosis not present

## 2016-12-14 DIAGNOSIS — M5442 Lumbago with sciatica, left side: Secondary | ICD-10-CM | POA: Diagnosis not present

## 2016-12-15 DIAGNOSIS — M62830 Muscle spasm of back: Secondary | ICD-10-CM | POA: Diagnosis not present

## 2016-12-15 DIAGNOSIS — M9903 Segmental and somatic dysfunction of lumbar region: Secondary | ICD-10-CM | POA: Diagnosis not present

## 2016-12-15 DIAGNOSIS — M5441 Lumbago with sciatica, right side: Secondary | ICD-10-CM | POA: Diagnosis not present

## 2016-12-15 DIAGNOSIS — M5442 Lumbago with sciatica, left side: Secondary | ICD-10-CM | POA: Diagnosis not present

## 2016-12-17 DIAGNOSIS — M5441 Lumbago with sciatica, right side: Secondary | ICD-10-CM | POA: Diagnosis not present

## 2016-12-17 DIAGNOSIS — K5730 Diverticulosis of large intestine without perforation or abscess without bleeding: Secondary | ICD-10-CM | POA: Diagnosis not present

## 2016-12-17 DIAGNOSIS — M9903 Segmental and somatic dysfunction of lumbar region: Secondary | ICD-10-CM | POA: Diagnosis not present

## 2016-12-17 DIAGNOSIS — M5442 Lumbago with sciatica, left side: Secondary | ICD-10-CM | POA: Diagnosis not present

## 2016-12-17 DIAGNOSIS — M62830 Muscle spasm of back: Secondary | ICD-10-CM | POA: Diagnosis not present

## 2016-12-17 DIAGNOSIS — N201 Calculus of ureter: Secondary | ICD-10-CM | POA: Diagnosis not present

## 2016-12-17 DIAGNOSIS — N132 Hydronephrosis with renal and ureteral calculous obstruction: Secondary | ICD-10-CM | POA: Diagnosis not present

## 2016-12-18 DIAGNOSIS — N201 Calculus of ureter: Secondary | ICD-10-CM | POA: Diagnosis not present

## 2016-12-18 DIAGNOSIS — N200 Calculus of kidney: Secondary | ICD-10-CM | POA: Diagnosis not present

## 2016-12-21 DIAGNOSIS — M5442 Lumbago with sciatica, left side: Secondary | ICD-10-CM | POA: Diagnosis not present

## 2016-12-21 DIAGNOSIS — M62830 Muscle spasm of back: Secondary | ICD-10-CM | POA: Diagnosis not present

## 2016-12-21 DIAGNOSIS — M9903 Segmental and somatic dysfunction of lumbar region: Secondary | ICD-10-CM | POA: Diagnosis not present

## 2016-12-21 DIAGNOSIS — M5441 Lumbago with sciatica, right side: Secondary | ICD-10-CM | POA: Diagnosis not present

## 2016-12-23 DIAGNOSIS — M5442 Lumbago with sciatica, left side: Secondary | ICD-10-CM | POA: Diagnosis not present

## 2016-12-23 DIAGNOSIS — M5441 Lumbago with sciatica, right side: Secondary | ICD-10-CM | POA: Diagnosis not present

## 2016-12-23 DIAGNOSIS — M9903 Segmental and somatic dysfunction of lumbar region: Secondary | ICD-10-CM | POA: Diagnosis not present

## 2016-12-23 DIAGNOSIS — M62830 Muscle spasm of back: Secondary | ICD-10-CM | POA: Diagnosis not present

## 2016-12-28 DIAGNOSIS — M9903 Segmental and somatic dysfunction of lumbar region: Secondary | ICD-10-CM | POA: Diagnosis not present

## 2016-12-28 DIAGNOSIS — M5441 Lumbago with sciatica, right side: Secondary | ICD-10-CM | POA: Diagnosis not present

## 2016-12-28 DIAGNOSIS — M62830 Muscle spasm of back: Secondary | ICD-10-CM | POA: Diagnosis not present

## 2016-12-28 DIAGNOSIS — M5442 Lumbago with sciatica, left side: Secondary | ICD-10-CM | POA: Diagnosis not present

## 2016-12-31 DIAGNOSIS — M5416 Radiculopathy, lumbar region: Secondary | ICD-10-CM | POA: Diagnosis not present

## 2016-12-31 DIAGNOSIS — M549 Dorsalgia, unspecified: Secondary | ICD-10-CM | POA: Diagnosis not present

## 2016-12-31 DIAGNOSIS — N390 Urinary tract infection, site not specified: Secondary | ICD-10-CM | POA: Diagnosis not present

## 2016-12-31 DIAGNOSIS — R350 Frequency of micturition: Secondary | ICD-10-CM | POA: Diagnosis not present

## 2016-12-31 DIAGNOSIS — G63 Polyneuropathy in diseases classified elsewhere: Secondary | ICD-10-CM | POA: Diagnosis not present

## 2016-12-31 DIAGNOSIS — N201 Calculus of ureter: Secondary | ICD-10-CM | POA: Diagnosis not present

## 2017-01-21 DIAGNOSIS — R350 Frequency of micturition: Secondary | ICD-10-CM | POA: Diagnosis not present

## 2017-01-21 DIAGNOSIS — N201 Calculus of ureter: Secondary | ICD-10-CM | POA: Diagnosis not present

## 2017-02-02 DIAGNOSIS — H52223 Regular astigmatism, bilateral: Secondary | ICD-10-CM | POA: Diagnosis not present

## 2017-02-02 DIAGNOSIS — H524 Presbyopia: Secondary | ICD-10-CM | POA: Diagnosis not present

## 2017-02-02 DIAGNOSIS — H5203 Hypermetropia, bilateral: Secondary | ICD-10-CM | POA: Diagnosis not present

## 2017-02-02 DIAGNOSIS — E119 Type 2 diabetes mellitus without complications: Secondary | ICD-10-CM | POA: Diagnosis not present

## 2017-02-05 DIAGNOSIS — M7062 Trochanteric bursitis, left hip: Secondary | ICD-10-CM | POA: Diagnosis not present

## 2017-02-05 DIAGNOSIS — M25552 Pain in left hip: Secondary | ICD-10-CM | POA: Diagnosis not present

## 2017-02-05 DIAGNOSIS — Z6831 Body mass index (BMI) 31.0-31.9, adult: Secondary | ICD-10-CM | POA: Diagnosis not present

## 2017-02-05 DIAGNOSIS — M545 Low back pain: Secondary | ICD-10-CM | POA: Diagnosis not present

## 2017-02-05 DIAGNOSIS — M48062 Spinal stenosis, lumbar region with neurogenic claudication: Secondary | ICD-10-CM | POA: Diagnosis not present

## 2017-02-11 DIAGNOSIS — M5416 Radiculopathy, lumbar region: Secondary | ICD-10-CM | POA: Diagnosis not present

## 2017-02-11 DIAGNOSIS — M7062 Trochanteric bursitis, left hip: Secondary | ICD-10-CM | POA: Diagnosis not present

## 2017-02-18 DIAGNOSIS — M549 Dorsalgia, unspecified: Secondary | ICD-10-CM | POA: Diagnosis not present

## 2017-02-18 DIAGNOSIS — M5416 Radiculopathy, lumbar region: Secondary | ICD-10-CM | POA: Diagnosis not present

## 2017-02-18 DIAGNOSIS — G63 Polyneuropathy in diseases classified elsewhere: Secondary | ICD-10-CM | POA: Diagnosis not present

## 2017-02-18 DIAGNOSIS — E1140 Type 2 diabetes mellitus with diabetic neuropathy, unspecified: Secondary | ICD-10-CM | POA: Diagnosis not present

## 2017-02-18 DIAGNOSIS — F5109 Other insomnia not due to a substance or known physiological condition: Secondary | ICD-10-CM | POA: Diagnosis not present

## 2017-03-01 DIAGNOSIS — L720 Epidermal cyst: Secondary | ICD-10-CM | POA: Diagnosis not present

## 2017-03-01 DIAGNOSIS — D2371 Other benign neoplasm of skin of right lower limb, including hip: Secondary | ICD-10-CM | POA: Diagnosis not present

## 2017-03-01 DIAGNOSIS — Z85828 Personal history of other malignant neoplasm of skin: Secondary | ICD-10-CM | POA: Diagnosis not present

## 2017-03-01 DIAGNOSIS — D224 Melanocytic nevi of scalp and neck: Secondary | ICD-10-CM | POA: Diagnosis not present

## 2017-03-01 DIAGNOSIS — D485 Neoplasm of uncertain behavior of skin: Secondary | ICD-10-CM | POA: Diagnosis not present

## 2017-03-01 DIAGNOSIS — C4441 Basal cell carcinoma of skin of scalp and neck: Secondary | ICD-10-CM | POA: Diagnosis not present

## 2017-03-01 DIAGNOSIS — L821 Other seborrheic keratosis: Secondary | ICD-10-CM | POA: Diagnosis not present

## 2017-04-14 DIAGNOSIS — M9903 Segmental and somatic dysfunction of lumbar region: Secondary | ICD-10-CM | POA: Diagnosis not present

## 2017-04-14 DIAGNOSIS — M5441 Lumbago with sciatica, right side: Secondary | ICD-10-CM | POA: Diagnosis not present

## 2017-04-14 DIAGNOSIS — M5442 Lumbago with sciatica, left side: Secondary | ICD-10-CM | POA: Diagnosis not present

## 2017-04-14 DIAGNOSIS — M48062 Spinal stenosis, lumbar region with neurogenic claudication: Secondary | ICD-10-CM | POA: Diagnosis not present

## 2017-04-14 DIAGNOSIS — M62830 Muscle spasm of back: Secondary | ICD-10-CM | POA: Diagnosis not present

## 2017-04-15 DIAGNOSIS — C4441 Basal cell carcinoma of skin of scalp and neck: Secondary | ICD-10-CM | POA: Diagnosis not present

## 2017-04-15 DIAGNOSIS — M9903 Segmental and somatic dysfunction of lumbar region: Secondary | ICD-10-CM | POA: Diagnosis not present

## 2017-04-15 DIAGNOSIS — M62830 Muscle spasm of back: Secondary | ICD-10-CM | POA: Diagnosis not present

## 2017-04-15 DIAGNOSIS — Z85828 Personal history of other malignant neoplasm of skin: Secondary | ICD-10-CM | POA: Diagnosis not present

## 2017-04-15 DIAGNOSIS — M5442 Lumbago with sciatica, left side: Secondary | ICD-10-CM | POA: Diagnosis not present

## 2017-04-15 DIAGNOSIS — M5441 Lumbago with sciatica, right side: Secondary | ICD-10-CM | POA: Diagnosis not present

## 2017-04-22 DIAGNOSIS — B373 Candidiasis of vulva and vagina: Secondary | ICD-10-CM | POA: Diagnosis not present

## 2017-04-22 DIAGNOSIS — N202 Calculus of kidney with calculus of ureter: Secondary | ICD-10-CM | POA: Diagnosis not present

## 2017-04-23 DIAGNOSIS — F5109 Other insomnia not due to a substance or known physiological condition: Secondary | ICD-10-CM | POA: Diagnosis not present

## 2017-04-23 DIAGNOSIS — M5416 Radiculopathy, lumbar region: Secondary | ICD-10-CM | POA: Diagnosis not present

## 2017-04-23 DIAGNOSIS — Z7984 Long term (current) use of oral hypoglycemic drugs: Secondary | ICD-10-CM | POA: Diagnosis not present

## 2017-04-23 DIAGNOSIS — M549 Dorsalgia, unspecified: Secondary | ICD-10-CM | POA: Diagnosis not present

## 2017-04-23 DIAGNOSIS — G63 Polyneuropathy in diseases classified elsewhere: Secondary | ICD-10-CM | POA: Diagnosis not present

## 2017-04-23 DIAGNOSIS — E1140 Type 2 diabetes mellitus with diabetic neuropathy, unspecified: Secondary | ICD-10-CM | POA: Diagnosis not present

## 2017-06-02 DIAGNOSIS — M5441 Lumbago with sciatica, right side: Secondary | ICD-10-CM | POA: Diagnosis not present

## 2017-06-02 DIAGNOSIS — M9903 Segmental and somatic dysfunction of lumbar region: Secondary | ICD-10-CM | POA: Diagnosis not present

## 2017-06-02 DIAGNOSIS — M62830 Muscle spasm of back: Secondary | ICD-10-CM | POA: Diagnosis not present

## 2017-06-02 DIAGNOSIS — M5442 Lumbago with sciatica, left side: Secondary | ICD-10-CM | POA: Diagnosis not present

## 2017-06-07 DIAGNOSIS — M5442 Lumbago with sciatica, left side: Secondary | ICD-10-CM | POA: Diagnosis not present

## 2017-06-07 DIAGNOSIS — M9903 Segmental and somatic dysfunction of lumbar region: Secondary | ICD-10-CM | POA: Diagnosis not present

## 2017-06-07 DIAGNOSIS — M5441 Lumbago with sciatica, right side: Secondary | ICD-10-CM | POA: Diagnosis not present

## 2017-06-07 DIAGNOSIS — M62830 Muscle spasm of back: Secondary | ICD-10-CM | POA: Diagnosis not present

## 2017-06-09 ENCOUNTER — Other Ambulatory Visit: Payer: Self-pay | Admitting: Internal Medicine

## 2017-06-09 DIAGNOSIS — M5442 Lumbago with sciatica, left side: Secondary | ICD-10-CM | POA: Diagnosis not present

## 2017-06-09 DIAGNOSIS — Z1231 Encounter for screening mammogram for malignant neoplasm of breast: Secondary | ICD-10-CM

## 2017-06-09 DIAGNOSIS — M62830 Muscle spasm of back: Secondary | ICD-10-CM | POA: Diagnosis not present

## 2017-06-09 DIAGNOSIS — M5441 Lumbago with sciatica, right side: Secondary | ICD-10-CM | POA: Diagnosis not present

## 2017-06-09 DIAGNOSIS — M9903 Segmental and somatic dysfunction of lumbar region: Secondary | ICD-10-CM | POA: Diagnosis not present

## 2017-06-15 DIAGNOSIS — M25511 Pain in right shoulder: Secondary | ICD-10-CM | POA: Diagnosis not present

## 2017-06-15 DIAGNOSIS — M19011 Primary osteoarthritis, right shoulder: Secondary | ICD-10-CM | POA: Diagnosis not present

## 2017-06-15 DIAGNOSIS — M25512 Pain in left shoulder: Secondary | ICD-10-CM | POA: Insufficient documentation

## 2017-06-15 DIAGNOSIS — M19019 Primary osteoarthritis, unspecified shoulder: Secondary | ICD-10-CM | POA: Insufficient documentation

## 2017-06-15 DIAGNOSIS — M19012 Primary osteoarthritis, left shoulder: Secondary | ICD-10-CM | POA: Diagnosis not present

## 2017-06-23 DIAGNOSIS — R05 Cough: Secondary | ICD-10-CM | POA: Diagnosis not present

## 2017-06-23 DIAGNOSIS — M5416 Radiculopathy, lumbar region: Secondary | ICD-10-CM | POA: Diagnosis not present

## 2017-06-23 DIAGNOSIS — J209 Acute bronchitis, unspecified: Secondary | ICD-10-CM | POA: Diagnosis not present

## 2017-07-01 ENCOUNTER — Ambulatory Visit
Admission: RE | Admit: 2017-07-01 | Discharge: 2017-07-01 | Disposition: A | Discharge status: Home / Self Care | Payer: PPO | Source: Ambulatory Visit | Attending: Internal Medicine | Admitting: Internal Medicine

## 2017-07-01 DIAGNOSIS — Z1231 Encounter for screening mammogram for malignant neoplasm of breast: Secondary | ICD-10-CM

## 2017-07-13 DIAGNOSIS — I10 Essential (primary) hypertension: Secondary | ICD-10-CM | POA: Diagnosis not present

## 2017-07-13 DIAGNOSIS — M48062 Spinal stenosis, lumbar region with neurogenic claudication: Secondary | ICD-10-CM | POA: Diagnosis not present

## 2017-07-13 DIAGNOSIS — Z6833 Body mass index (BMI) 33.0-33.9, adult: Secondary | ICD-10-CM | POA: Diagnosis not present

## 2017-07-14 DIAGNOSIS — M533 Sacrococcygeal disorders, not elsewhere classified: Secondary | ICD-10-CM | POA: Insufficient documentation

## 2017-07-14 DIAGNOSIS — M9903 Segmental and somatic dysfunction of lumbar region: Secondary | ICD-10-CM | POA: Diagnosis not present

## 2017-07-14 DIAGNOSIS — M5442 Lumbago with sciatica, left side: Secondary | ICD-10-CM | POA: Diagnosis not present

## 2017-07-14 DIAGNOSIS — M5441 Lumbago with sciatica, right side: Secondary | ICD-10-CM | POA: Diagnosis not present

## 2017-07-14 DIAGNOSIS — M62830 Muscle spasm of back: Secondary | ICD-10-CM | POA: Diagnosis not present

## 2017-07-21 DIAGNOSIS — M533 Sacrococcygeal disorders, not elsewhere classified: Secondary | ICD-10-CM | POA: Diagnosis not present

## 2017-07-29 DIAGNOSIS — N200 Calculus of kidney: Secondary | ICD-10-CM | POA: Diagnosis not present

## 2017-08-04 DIAGNOSIS — M25511 Pain in right shoulder: Secondary | ICD-10-CM | POA: Diagnosis not present

## 2017-08-04 DIAGNOSIS — M25512 Pain in left shoulder: Secondary | ICD-10-CM | POA: Diagnosis not present

## 2017-08-16 DIAGNOSIS — M5441 Lumbago with sciatica, right side: Secondary | ICD-10-CM | POA: Diagnosis not present

## 2017-08-16 DIAGNOSIS — M5442 Lumbago with sciatica, left side: Secondary | ICD-10-CM | POA: Diagnosis not present

## 2017-08-16 DIAGNOSIS — M9903 Segmental and somatic dysfunction of lumbar region: Secondary | ICD-10-CM | POA: Diagnosis not present

## 2017-08-16 DIAGNOSIS — M62830 Muscle spasm of back: Secondary | ICD-10-CM | POA: Diagnosis not present

## 2017-08-19 DIAGNOSIS — M9903 Segmental and somatic dysfunction of lumbar region: Secondary | ICD-10-CM | POA: Diagnosis not present

## 2017-08-19 DIAGNOSIS — M5442 Lumbago with sciatica, left side: Secondary | ICD-10-CM | POA: Diagnosis not present

## 2017-08-19 DIAGNOSIS — M62830 Muscle spasm of back: Secondary | ICD-10-CM | POA: Diagnosis not present

## 2017-08-19 DIAGNOSIS — M5441 Lumbago with sciatica, right side: Secondary | ICD-10-CM | POA: Diagnosis not present

## 2017-08-26 DIAGNOSIS — M9903 Segmental and somatic dysfunction of lumbar region: Secondary | ICD-10-CM | POA: Diagnosis not present

## 2017-08-26 DIAGNOSIS — M5441 Lumbago with sciatica, right side: Secondary | ICD-10-CM | POA: Diagnosis not present

## 2017-08-26 DIAGNOSIS — M5442 Lumbago with sciatica, left side: Secondary | ICD-10-CM | POA: Diagnosis not present

## 2017-08-26 DIAGNOSIS — M62830 Muscle spasm of back: Secondary | ICD-10-CM | POA: Diagnosis not present

## 2017-08-31 DIAGNOSIS — M62830 Muscle spasm of back: Secondary | ICD-10-CM | POA: Diagnosis not present

## 2017-08-31 DIAGNOSIS — M9903 Segmental and somatic dysfunction of lumbar region: Secondary | ICD-10-CM | POA: Diagnosis not present

## 2017-08-31 DIAGNOSIS — M5442 Lumbago with sciatica, left side: Secondary | ICD-10-CM | POA: Diagnosis not present

## 2017-08-31 DIAGNOSIS — M5441 Lumbago with sciatica, right side: Secondary | ICD-10-CM | POA: Diagnosis not present

## 2017-09-02 DIAGNOSIS — M5442 Lumbago with sciatica, left side: Secondary | ICD-10-CM | POA: Diagnosis not present

## 2017-09-02 DIAGNOSIS — M62830 Muscle spasm of back: Secondary | ICD-10-CM | POA: Diagnosis not present

## 2017-09-02 DIAGNOSIS — M5441 Lumbago with sciatica, right side: Secondary | ICD-10-CM | POA: Diagnosis not present

## 2017-09-02 DIAGNOSIS — M9903 Segmental and somatic dysfunction of lumbar region: Secondary | ICD-10-CM | POA: Diagnosis not present

## 2017-09-06 DIAGNOSIS — M9903 Segmental and somatic dysfunction of lumbar region: Secondary | ICD-10-CM | POA: Diagnosis not present

## 2017-09-06 DIAGNOSIS — M5442 Lumbago with sciatica, left side: Secondary | ICD-10-CM | POA: Diagnosis not present

## 2017-09-06 DIAGNOSIS — M62830 Muscle spasm of back: Secondary | ICD-10-CM | POA: Diagnosis not present

## 2017-09-06 DIAGNOSIS — M5441 Lumbago with sciatica, right side: Secondary | ICD-10-CM | POA: Diagnosis not present

## 2017-09-09 DIAGNOSIS — M62830 Muscle spasm of back: Secondary | ICD-10-CM | POA: Diagnosis not present

## 2017-09-09 DIAGNOSIS — M5441 Lumbago with sciatica, right side: Secondary | ICD-10-CM | POA: Diagnosis not present

## 2017-09-09 DIAGNOSIS — M5442 Lumbago with sciatica, left side: Secondary | ICD-10-CM | POA: Diagnosis not present

## 2017-09-09 DIAGNOSIS — M9903 Segmental and somatic dysfunction of lumbar region: Secondary | ICD-10-CM | POA: Diagnosis not present

## 2017-09-13 DIAGNOSIS — M5441 Lumbago with sciatica, right side: Secondary | ICD-10-CM | POA: Diagnosis not present

## 2017-09-13 DIAGNOSIS — M5442 Lumbago with sciatica, left side: Secondary | ICD-10-CM | POA: Diagnosis not present

## 2017-09-13 DIAGNOSIS — M62830 Muscle spasm of back: Secondary | ICD-10-CM | POA: Diagnosis not present

## 2017-09-13 DIAGNOSIS — M9903 Segmental and somatic dysfunction of lumbar region: Secondary | ICD-10-CM | POA: Diagnosis not present

## 2017-09-16 DIAGNOSIS — M62830 Muscle spasm of back: Secondary | ICD-10-CM | POA: Diagnosis not present

## 2017-09-16 DIAGNOSIS — M9903 Segmental and somatic dysfunction of lumbar region: Secondary | ICD-10-CM | POA: Diagnosis not present

## 2017-09-16 DIAGNOSIS — M5441 Lumbago with sciatica, right side: Secondary | ICD-10-CM | POA: Diagnosis not present

## 2017-09-16 DIAGNOSIS — M5442 Lumbago with sciatica, left side: Secondary | ICD-10-CM | POA: Diagnosis not present

## 2017-09-23 DIAGNOSIS — Z1389 Encounter for screening for other disorder: Secondary | ICD-10-CM | POA: Diagnosis not present

## 2017-09-23 DIAGNOSIS — E559 Vitamin D deficiency, unspecified: Secondary | ICD-10-CM | POA: Diagnosis not present

## 2017-09-23 DIAGNOSIS — D509 Iron deficiency anemia, unspecified: Secondary | ICD-10-CM | POA: Diagnosis not present

## 2017-09-23 DIAGNOSIS — Z794 Long term (current) use of insulin: Secondary | ICD-10-CM | POA: Diagnosis not present

## 2017-09-23 DIAGNOSIS — E782 Mixed hyperlipidemia: Secondary | ICD-10-CM | POA: Diagnosis not present

## 2017-09-23 DIAGNOSIS — G2581 Restless legs syndrome: Secondary | ICD-10-CM | POA: Diagnosis not present

## 2017-09-23 DIAGNOSIS — I10 Essential (primary) hypertension: Secondary | ICD-10-CM | POA: Diagnosis not present

## 2017-09-23 DIAGNOSIS — E1140 Type 2 diabetes mellitus with diabetic neuropathy, unspecified: Secondary | ICD-10-CM | POA: Diagnosis not present

## 2017-09-23 DIAGNOSIS — J209 Acute bronchitis, unspecified: Secondary | ICD-10-CM | POA: Diagnosis not present

## 2017-09-23 DIAGNOSIS — G4733 Obstructive sleep apnea (adult) (pediatric): Secondary | ICD-10-CM | POA: Diagnosis not present

## 2017-09-23 DIAGNOSIS — J449 Chronic obstructive pulmonary disease, unspecified: Secondary | ICD-10-CM | POA: Diagnosis not present

## 2017-09-23 DIAGNOSIS — Z0000 Encounter for general adult medical examination without abnormal findings: Secondary | ICD-10-CM | POA: Diagnosis not present

## 2017-09-23 DIAGNOSIS — Z79899 Other long term (current) drug therapy: Secondary | ICD-10-CM | POA: Diagnosis not present

## 2017-09-23 DIAGNOSIS — M2550 Pain in unspecified joint: Secondary | ICD-10-CM | POA: Diagnosis not present

## 2017-09-23 DIAGNOSIS — K219 Gastro-esophageal reflux disease without esophagitis: Secondary | ICD-10-CM | POA: Diagnosis not present

## 2017-09-30 DIAGNOSIS — M25511 Pain in right shoulder: Secondary | ICD-10-CM | POA: Diagnosis not present

## 2017-09-30 DIAGNOSIS — M25512 Pain in left shoulder: Secondary | ICD-10-CM | POA: Diagnosis not present

## 2017-10-07 DIAGNOSIS — M25511 Pain in right shoulder: Secondary | ICD-10-CM | POA: Diagnosis not present

## 2017-10-07 DIAGNOSIS — M6281 Muscle weakness (generalized): Secondary | ICD-10-CM | POA: Diagnosis not present

## 2017-10-07 DIAGNOSIS — M25512 Pain in left shoulder: Secondary | ICD-10-CM | POA: Diagnosis not present

## 2017-10-07 DIAGNOSIS — M19011 Primary osteoarthritis, right shoulder: Secondary | ICD-10-CM | POA: Diagnosis not present

## 2017-10-19 DIAGNOSIS — Z780 Asymptomatic menopausal state: Secondary | ICD-10-CM | POA: Diagnosis not present

## 2017-10-26 DIAGNOSIS — M5441 Lumbago with sciatica, right side: Secondary | ICD-10-CM | POA: Diagnosis not present

## 2017-10-26 DIAGNOSIS — M9903 Segmental and somatic dysfunction of lumbar region: Secondary | ICD-10-CM | POA: Diagnosis not present

## 2017-10-26 DIAGNOSIS — M5442 Lumbago with sciatica, left side: Secondary | ICD-10-CM | POA: Diagnosis not present

## 2017-10-26 DIAGNOSIS — M62830 Muscle spasm of back: Secondary | ICD-10-CM | POA: Diagnosis not present

## 2017-10-28 DIAGNOSIS — M1712 Unilateral primary osteoarthritis, left knee: Secondary | ICD-10-CM | POA: Diagnosis not present

## 2017-10-28 DIAGNOSIS — M19011 Primary osteoarthritis, right shoulder: Secondary | ICD-10-CM | POA: Diagnosis not present

## 2017-11-29 ENCOUNTER — Other Ambulatory Visit: Payer: Self-pay | Admitting: Orthopedic Surgery

## 2017-11-29 ENCOUNTER — Ambulatory Visit (HOSPITAL_COMMUNITY)
Admission: RE | Admit: 2017-11-29 | Discharge: 2017-11-29 | Disposition: A | Discharge status: Home / Self Care | Payer: PPO | Source: Ambulatory Visit | Attending: Orthopedic Surgery | Admitting: Orthopedic Surgery

## 2017-11-29 ENCOUNTER — Other Ambulatory Visit (HOSPITAL_COMMUNITY): Payer: Self-pay | Admitting: Orthopedic Surgery

## 2017-11-29 DIAGNOSIS — M79605 Pain in left leg: Secondary | ICD-10-CM | POA: Diagnosis not present

## 2017-11-29 DIAGNOSIS — M25552 Pain in left hip: Secondary | ICD-10-CM | POA: Diagnosis not present

## 2017-11-29 DIAGNOSIS — M5416 Radiculopathy, lumbar region: Secondary | ICD-10-CM

## 2017-11-29 DIAGNOSIS — M7989 Other specified soft tissue disorders: Principal | ICD-10-CM

## 2017-11-29 DIAGNOSIS — M5136 Other intervertebral disc degeneration, lumbar region: Secondary | ICD-10-CM | POA: Diagnosis not present

## 2017-11-29 NOTE — Progress Notes (Signed)
*  Preliminary Results* Left lower extremity venous duplex completed. Left lower extremity is negative for deep vein thrombosis. There is no evidence of left Baker's cyst.  11/29/2017 2:44 PM  [name] Dawna Part

## 2017-12-08 ENCOUNTER — Ambulatory Visit
Admission: RE | Admit: 2017-12-08 | Discharge: 2017-12-08 | Disposition: A | Discharge status: Home / Self Care | Payer: PPO | Source: Ambulatory Visit | Attending: Internal Medicine | Admitting: Internal Medicine

## 2017-12-08 ENCOUNTER — Other Ambulatory Visit: Payer: Self-pay | Admitting: Internal Medicine

## 2017-12-08 DIAGNOSIS — M25572 Pain in left ankle and joints of left foot: Secondary | ICD-10-CM

## 2017-12-08 DIAGNOSIS — Z794 Long term (current) use of insulin: Secondary | ICD-10-CM | POA: Diagnosis not present

## 2017-12-08 DIAGNOSIS — E559 Vitamin D deficiency, unspecified: Secondary | ICD-10-CM | POA: Diagnosis not present

## 2017-12-08 DIAGNOSIS — M5416 Radiculopathy, lumbar region: Secondary | ICD-10-CM | POA: Diagnosis not present

## 2017-12-08 DIAGNOSIS — E1140 Type 2 diabetes mellitus with diabetic neuropathy, unspecified: Secondary | ICD-10-CM | POA: Diagnosis not present

## 2017-12-08 DIAGNOSIS — M7989 Other specified soft tissue disorders: Secondary | ICD-10-CM | POA: Diagnosis not present

## 2017-12-15 ENCOUNTER — Ambulatory Visit
Admission: RE | Admit: 2017-12-15 | Discharge: 2017-12-15 | Disposition: A | Discharge status: Home / Self Care | Payer: PPO | Source: Ambulatory Visit | Attending: Orthopedic Surgery | Admitting: Orthopedic Surgery

## 2017-12-15 VITALS — BP 115/64 | HR 75

## 2017-12-15 DIAGNOSIS — Z981 Arthrodesis status: Secondary | ICD-10-CM

## 2017-12-15 DIAGNOSIS — M5416 Radiculopathy, lumbar region: Secondary | ICD-10-CM

## 2017-12-15 DIAGNOSIS — M48061 Spinal stenosis, lumbar region without neurogenic claudication: Secondary | ICD-10-CM | POA: Diagnosis not present

## 2017-12-15 MED ORDER — IOPAMIDOL (ISOVUE-M 200) INJECTION 41%
15.0000 mL | Freq: Once | INTRAMUSCULAR | Status: AC
  Administered 2017-12-15: 15 mL via INTRATHECAL

## 2017-12-15 MED ORDER — ONDANSETRON HCL 4 MG/2ML IJ SOLN
4.0000 mg | Freq: Once | INTRAMUSCULAR | Status: AC
  Administered 2017-12-15: 4 mg via INTRAMUSCULAR

## 2017-12-15 MED ORDER — MEPERIDINE HCL 50 MG/ML IJ SOLN
50.0000 mg | Freq: Once | INTRAMUSCULAR | Status: AC
  Administered 2017-12-15: 50 mg via INTRAMUSCULAR

## 2017-12-15 MED ORDER — DIAZEPAM 5 MG PO TABS
5.0000 mg | ORAL_TABLET | Freq: Once | ORAL | Status: AC
  Administered 2017-12-15: 5 mg via ORAL

## 2017-12-15 NOTE — Discharge Instructions (Signed)

## 2017-12-30 DIAGNOSIS — E559 Vitamin D deficiency, unspecified: Secondary | ICD-10-CM | POA: Diagnosis not present

## 2017-12-30 DIAGNOSIS — M25572 Pain in left ankle and joints of left foot: Secondary | ICD-10-CM | POA: Diagnosis not present

## 2017-12-30 DIAGNOSIS — M5416 Radiculopathy, lumbar region: Secondary | ICD-10-CM | POA: Diagnosis not present

## 2017-12-30 DIAGNOSIS — Z7984 Long term (current) use of oral hypoglycemic drugs: Secondary | ICD-10-CM | POA: Diagnosis not present

## 2017-12-30 DIAGNOSIS — G894 Chronic pain syndrome: Secondary | ICD-10-CM | POA: Diagnosis not present

## 2017-12-30 DIAGNOSIS — E1140 Type 2 diabetes mellitus with diabetic neuropathy, unspecified: Secondary | ICD-10-CM | POA: Diagnosis not present

## 2018-01-05 DIAGNOSIS — M533 Sacrococcygeal disorders, not elsewhere classified: Secondary | ICD-10-CM | POA: Diagnosis not present

## 2018-01-17 ENCOUNTER — Telehealth: Payer: Self-pay | Admitting: *Deleted

## 2018-01-17 ENCOUNTER — Other Ambulatory Visit: Payer: Self-pay | Admitting: Neurological Surgery

## 2018-01-17 DIAGNOSIS — Z6831 Body mass index (BMI) 31.0-31.9, adult: Secondary | ICD-10-CM | POA: Diagnosis not present

## 2018-01-17 DIAGNOSIS — M48062 Spinal stenosis, lumbar region with neurogenic claudication: Secondary | ICD-10-CM | POA: Diagnosis not present

## 2018-01-17 NOTE — Telephone Encounter (Signed)
   [facility] Pre-operative Risk Assessment    Request for surgical clearance:  1. What type of surgery is being performed?  L1-2 LAMINECTOMY    2. When is this surgery scheduled?  03/03/18   3. What type of clearance is required (medical clearance vs. Pharmacy clearance to hold med vs. Both)?  BOTH  4. Are there any medications that need to be held prior to surgery and how long? NOT SPECIFIED   5. Practice name and name of physician performing surgery?  [facility] --[name] MD  6. What is your office phone number [phone]    7.   What is your office fax number [phone]  8.   Anesthesia type (None, local, MAC, general) ?  NOT Tonita Phoenix 01/17/2018, 3:39 PM  _________________________________________________________________   (provider comments below)

## 2018-01-18 NOTE — Telephone Encounter (Signed)
   Primary Cardiologist: Dr Marlou Porch  Chart reviewed as part of pre-operative protocol coverage. Because of [name]'s past medical history and time since last visit, he/she will require a follow-up visit in order to better assess preoperative cardiovascular risk.  Pre-op covering staff: - Please schedule appointment and call patient to inform them. - Please contact requesting surgeon's office via preferred method (i.e, phone, fax) to inform them of need for appointment prior to surgery.  Kerin Ransom, PA-C  01/18/2018, 4:12 PM

## 2018-01-19 NOTE — Telephone Encounter (Signed)
Spoke with patient and she aware of her appt on 01/28/2018 with Dr Marlou Porch.

## 2018-01-20 DIAGNOSIS — R350 Frequency of micturition: Secondary | ICD-10-CM | POA: Diagnosis not present

## 2018-01-20 DIAGNOSIS — N200 Calculus of kidney: Secondary | ICD-10-CM | POA: Diagnosis not present

## 2018-01-25 DIAGNOSIS — I10 Essential (primary) hypertension: Secondary | ICD-10-CM | POA: Diagnosis not present

## 2018-01-25 DIAGNOSIS — J42 Unspecified chronic bronchitis: Secondary | ICD-10-CM | POA: Diagnosis not present

## 2018-01-25 DIAGNOSIS — E1140 Type 2 diabetes mellitus with diabetic neuropathy, unspecified: Secondary | ICD-10-CM | POA: Diagnosis not present

## 2018-01-25 DIAGNOSIS — Z794 Long term (current) use of insulin: Secondary | ICD-10-CM | POA: Diagnosis not present

## 2018-01-25 DIAGNOSIS — D509 Iron deficiency anemia, unspecified: Secondary | ICD-10-CM | POA: Diagnosis not present

## 2018-01-25 DIAGNOSIS — J449 Chronic obstructive pulmonary disease, unspecified: Secondary | ICD-10-CM | POA: Diagnosis not present

## 2018-01-25 DIAGNOSIS — E782 Mixed hyperlipidemia: Secondary | ICD-10-CM | POA: Diagnosis not present

## 2018-01-25 DIAGNOSIS — M179 Osteoarthritis of knee, unspecified: Secondary | ICD-10-CM | POA: Diagnosis not present

## 2018-01-28 ENCOUNTER — Encounter: Payer: Self-pay | Admitting: Cardiology

## 2018-01-28 ENCOUNTER — Ambulatory Visit (INDEPENDENT_AMBULATORY_CARE_PROVIDER_SITE_OTHER): Payer: PPO | Admitting: Cardiology

## 2018-01-28 VITALS — BP 138/74 | HR 81 | Ht 66.0 in | Wt 203.8 lb

## 2018-01-28 DIAGNOSIS — Z01810 Encounter for preprocedural cardiovascular examination: Secondary | ICD-10-CM | POA: Diagnosis not present

## 2018-01-28 DIAGNOSIS — I10 Essential (primary) hypertension: Secondary | ICD-10-CM

## 2018-01-28 DIAGNOSIS — R002 Palpitations: Secondary | ICD-10-CM

## 2018-01-28 NOTE — Progress Notes (Signed)
[place]. [address] [place], [place]  [address] Phone: [phone] Fax:  [phone]  Date:  01/28/2018   ID:  Lori, [name] 12-24-1940, MRN [id number]  PCP:  [name], MD   History of Present Illness: [name] is a 77 y.o. female for cardiac risk stratification prior to back surgery at the request of Dr. Inda Merlin and Dr. Ronnald Ramp.   Previous visit was for follow up of dyspnea on exertion as well as evaluation of episodes of high heart rate at nighttime. This, dyspnea has been  ever since she had her knee surgery she states. Chest x-ray was unremarkable. Dr. Inda Merlin walked her around the office and she became quite short of breath but her oxygen saturations maintained at 98%. No wheezes, no obvious chest pain. She has not been having any claudication. While doing water aerobics for approximately an hour she feels fine.  Cardiac risk factors of diabetes and history of DVT in 2003. Her father had a heart attack in his 65s. Her hemoglobin was 12.4.   In regards to her fast heart rates at night, she is splitting her Toprol and doing quite well with this. This is improved.  Her shortness of breath is fairly similar but may be improved. She has been taking care of her husband who has had bypass surgery.  She has had a reassuring workup from pulmonary medicine as well as.   - 2012, 2014,2017 she underwent a nuclear stress test which was low risk, no ischemia.   - She's also previously undergone CT scan to rule out pulmonary embolism which was negative.   - Echocardiogram performed on 12/06/12 was overall reassuring. Normal EF.   - Cardiopulmonary stress test on 07/11/15 also reassuring  01/28/2018- preoperative risk stratification visit for back surgery.  Still able to perform water aerobics without anginal symptoms.  Nuclear stress test 2017 low risk, no ischemia.  Normal EF.  Cardiopulmonary stress test also reassuring.  Denies any chest pain fevers chills nausea  vomiting syncope bleeding.  Positive back pain.   Wt Readings from Last 3 Encounters:  01/28/18 203 lb 12.8 oz (92.4 kg)  10/09/16 220 lb 14.4 oz (100.2 kg)  09/30/16 205 lb 5 oz (93.1 kg)     Past Medical History:  Diagnosis Date  . Anemia   . Arthritis   . Diabetes mellitus   . DVT (deep venous thrombosis) ([place])   . Dyspnea    on exertion  . GERD (gastroesophageal reflux disease)   . History of kidney stones   . History of stomach ulcers   . Hypertension    dr Marlou Porch  . Rapid heartbeat     Past Surgical History:  Procedure Laterality Date  . ABDOMINAL HYSTERECTOMY  1981  . APPENDECTOMY    . ARTHOSCOPIC ROTAOR CUFF REPAIR Left 2010 or 2011  . BACK SURGERY  x 2 1999 and 2008   lower back L 4 to L 5 L5 to S 1  . BREAST BIOPSY Bilateral 05/18/1997  . CARPAL TUNNEL RELEASE Right 2014  . CARPAL TUNNEL RELEASE Bilateral   . COLONOSCOPY WITH PROPOFOL N/A 10/25/2012   Procedure: COLONOSCOPY WITH PROPOFOL;  Surgeon: [name], MD;  Location: [facility];  Service: Endoscopy;  Laterality: N/A;  . JOINT REPLACEMENT Right 2012   knee  . ovarian wedge resection  1966  . SPINAL CORD STIMULATOR INSERTION N/A 11/03/2012   Procedure: LUMBAR SPINAL CORD STIMULATOR INSERTION;  Surgeon: Duane Lope  Rolena Infante, MD;  Location: [facility];  Service: Orthopedics;  Laterality: N/A;  . TONSILLECTOMY  1960    Current Outpatient Medications  Medication Sig Dispense Refill  . aspirin EC 81 MG tablet Take 81 mg by mouth daily.    . B Complex-C (SUPER B COMPLEX PO) Take 1 tablet by mouth every evening.     . Biotin 5000 MCG TABS Take 5,000 mg by mouth every evening.     . Calcium-Magnesium-Vitamin D (CALCIUM 1200+D3 PO) Take 1 tablet by mouth every evening.    Marland Kitchen Co-Enzyme Q10 200 MG CAPS Take 200 mg by mouth every evening.    Marland Kitchen estradiol (ESTRACE) 1 MG tablet Take 1 mg by mouth at bedtime.     . fluticasone furoate-vilanterol (BREO ELLIPTA) 100-25 MCG/INH AEPB Inhale 2 puffs into the lungs daily.       . furosemide (LASIX) 20 MG tablet Take 20 mg by mouth daily as needed.  3  . glipiZIDE (GLUCOTROL) 10 MG tablet Take 10 mg by mouth 2 (two) times daily.    Marland Kitchen HYDROcodone-acetaminophen (NORCO) 7.5-325 MG tablet Take 1-2 tablets by mouth every 4 (four) hours as needed (for pain.). 60 tablet 0  . Insulin Glargine (TOUJEO SOLOSTAR) 300 UNIT/ML SOPN Inject 60 Units into the skin daily.     . Insulin Pen Needle (BD PEN NEEDLE NANO U/F) 32G X 4 MM MISC Inject 1 Device into the skin daily.     Marland Kitchen losartan (COZAAR) 100 MG tablet Take 100 mg by mouth every evening.     . Magnesium 500 MG TABS Take 1,500 mg by mouth every evening.     . meloxicam (MOBIC) 7.5 MG tablet Take 7.5 mg by mouth 2 (two) times daily as needed for pain.    . metFORMIN (GLUCOPHAGE) 1000 MG tablet Take 1,000 mg by mouth 2 (two) times daily.     . metoprolol succinate (TOPROL-XL) 50 MG 24 hr tablet Take 1 tablet (50 mg total) by mouth 2 (two) times daily. 180 tablet 3  . Multiple Vitamins-Minerals (MULTIVITAMIN WITH MINERALS) tablet Take 1 tablet by mouth every evening.     . Omega-3 Fatty Acids (FISH OIL) 1200 MG CAPS Take 1,200 mg by mouth 2 (two) times daily.    . potassium chloride (K-DUR,KLOR-CON) 10 MEQ tablet Take 10 mEq by mouth 2 (two) times daily.    . rosuvastatin (CRESTOR) 10 MG tablet Take 10 mg by mouth every evening.     . topiramate (TOPAMAX) 25 MG tablet Take 25 mg by mouth daily.     No current facility-administered medications for this visit.     Allergies:    Allergies  Allergen Reactions  . Hydrochlorothiazide Anaphylaxis    comatose  . Ace Inhibitors Cough  . Ezetimibe-Simvastatin Other (See Comments)    MYALGIA  . Indomethacin Other (See Comments)    PEPTIC ULCERS  . Lisinopril Cough  . Lyrica [Pregabalin] Other (See Comments)    Weight gain, drowsy  . Prednisone Itching, Swelling and Other (See Comments)    Facial redness/swelling   . Simvastatin Other (See Comments)    Elevated LFT's  . Vioxx  [Rofecoxib] Other (See Comments)    Anxiety & chills  . Troglitazone Other (See Comments)    Pt is unaware of reaction.  Marland Kitchen Antihistamines, Chlorpheniramine-Type Other (See Comments)    "HYPER"    Social History:  The patient  reports that she has never smoked. She has never used smokeless tobacco. She reports that she  does not drink alcohol or use drugs.   Family History  Problem Relation Age of Onset  . Heart disease Father   . Diabetes Mother     ROS:  Please see the history of present illness.   All other review of systems negative PHYSICAL EXAM: VS:  BP 138/74   Pulse 81   Ht 5\' 6"  (1.676 m)   Wt 203 lb 12.8 oz (92.4 kg)   LMP  (LMP Unknown)   SpO2 98%   BMI 32.89 kg/m  GEN: Well nourished, well developed, in no acute distress, overweight HEENT: normal  Neck: no JVD, carotid bruits, or masses Cardiac: RRR; no murmurs, rubs, or gallops,no edema  Respiratory:  clear to auscultation bilaterally, normal work of breathing GI: soft, nontender, nondistended, + BS MS: no deformity or atrophy  Skin: warm and dry, no rash Neuro:  Alert and Oriented x 3, Strength and sensation are intact Psych: euthymic mood, full affect   EKG:  Today 01/28/2018-sinus rhythm 81, septal infarct pattern, no other abnormalities, poor R wave progression personally examined and reviewed.  06/12/16-sinus rhythm 72 septal Q waves personally viewed-prior 05/14/15-sinus rhythm, 73, poor R-wave progression, septal infarct pattern personally viewed-no change from prior-previous 01/19/14-sinus rhythm, septal infarct pattern     ASSESSMENT AND PLAN:  Preoperative risk stratification prior to back surgery -Dr. Sherley Bounds.  She was still able to complete greater than 4 METS of activity with her water aerobics without any anginal symptoms.  She also had a nuclear stress test in 2017 that was overall low risk with no ischemia.  She may proceed with moderate overall cardiac risk based upon age.  Palpitations -  Previous elevated heart rate, controlled with Toprol.  Doing well.  Prior dyspnea - Prior cardiopulmonary stress test showed normal exercise capacity with no cardiac or pulmonary limitations, Dr. Vaughan Browner.  Nuclear stress test November 2017 also reassuring.  Hyperlipidemia - Continuing for now Crestor 10 mg.  LDL 113, HDL 78. -Could consider increasing Crestor to 20 mg, higher intensity dose based upon her aortic atherosclerosis.  Aortic atherosclerosis -Seen on myelogram.  On statin.  Blood pressure control.  Obesity -Continue to work on weight loss.  Enjoys water aerobics.  One year follow-up.   Signed, [name], MD [facility]  01/28/2018 5:14 PM

## 2018-01-28 NOTE — Patient Instructions (Addendum)
Medication Instructions:  The current medical regimen is effective;  continue present plan and medications.  Follow-Up: Follow up in 1 year with [name].  You will receive a letter in the mail 2 months before you are due.  Please call us when you receive this letter to schedule your follow up appointment.  If you need a refill on your cardiac medications before your next appointment, please call your pharmacy.  Thank you for choosing [facility]!!    OK to proceed with surgery as planned

## 2018-02-02 ENCOUNTER — Encounter: Payer: Self-pay | Admitting: Podiatry

## 2018-02-02 ENCOUNTER — Ambulatory Visit (INDEPENDENT_AMBULATORY_CARE_PROVIDER_SITE_OTHER): Payer: PPO

## 2018-02-02 ENCOUNTER — Ambulatory Visit: Payer: PPO | Admitting: Podiatry

## 2018-02-02 DIAGNOSIS — M2042 Other hammer toe(s) (acquired), left foot: Secondary | ICD-10-CM

## 2018-02-02 DIAGNOSIS — M2022 Hallux rigidus, left foot: Secondary | ICD-10-CM

## 2018-02-02 DIAGNOSIS — E1140 Type 2 diabetes mellitus with diabetic neuropathy, unspecified: Secondary | ICD-10-CM | POA: Diagnosis not present

## 2018-02-02 DIAGNOSIS — M2041 Other hammer toe(s) (acquired), right foot: Secondary | ICD-10-CM | POA: Diagnosis not present

## 2018-02-02 DIAGNOSIS — M779 Enthesopathy, unspecified: Secondary | ICD-10-CM | POA: Diagnosis not present

## 2018-02-02 DIAGNOSIS — M2021 Hallux rigidus, right foot: Secondary | ICD-10-CM | POA: Diagnosis not present

## 2018-02-02 DIAGNOSIS — E1149 Type 2 diabetes mellitus with other diabetic neurological complication: Secondary | ICD-10-CM

## 2018-02-02 MED ORDER — TRIAMCINOLONE ACETONIDE 10 MG/ML IJ SUSP
10.0000 mg | Freq: Once | INTRAMUSCULAR | Status: AC
  Administered 2018-02-02: 10 mg

## 2018-02-02 NOTE — Progress Notes (Signed)
Subjective:   Patient ID: [name], female   DOB: 77 y.o.   MRN: [id number]   HPI Patient states that she is having increased pain of the second joint right and noticed that the toe has been moving quite a bit.  Patient states that it sore and makes it hard to wear shoe gear and that she needs to be active due to her diabetes and does take insulin for it.  Patient also has significant deformity of the big toe joint bilateral with reduced range of motion and pain with palpation   ROS      Objective:  Physical Exam  Neurovascular status is unchanged from previous visit with patient noted to have elevated rigidly contracted second toe right over left foot with inflammation fluid of the metatarsal phalangeal joint.  Also has redness on top of the toe with getting blood and states she is having trouble wearing normal shoe gear and needs to be in shoes as the weather gets colder.  Patient does have mild neuropathic changes but they have not intensified her progress from previous visit     Assessment:  Inflammatory capsulitis second MPJ right with acute hammertoe deformity second right over left with rigid contracture of the joint and also is found to have loss of motion first MPJ bilateral     Plan:  H&P conditions reviewed and at this time him to try to get the second MPJ better and I did a proximal nerve block of the joint I did sterile prep and then aspirated the joint getting a small amount of clear fluid and injected with quarter cc dexamethasone Kenalog and applied padding to the second digit to take pressure off the toe.  We are going to get her approved for diabetic shoes as they are necessary in her condition and she will be seen back for those when we get approval from her physician  X-rays indicate significant elevation of second digit right with severe arthritis of the big toe joint bilateral with no indications of stress fracture or other advanced arthritic process

## 2018-02-08 DIAGNOSIS — H52223 Regular astigmatism, bilateral: Secondary | ICD-10-CM | POA: Diagnosis not present

## 2018-02-08 DIAGNOSIS — H524 Presbyopia: Secondary | ICD-10-CM | POA: Diagnosis not present

## 2018-02-08 DIAGNOSIS — H5203 Hypermetropia, bilateral: Secondary | ICD-10-CM | POA: Diagnosis not present

## 2018-02-08 DIAGNOSIS — H1851 Endothelial corneal dystrophy: Secondary | ICD-10-CM | POA: Diagnosis not present

## 2018-02-08 DIAGNOSIS — Z7984 Long term (current) use of oral hypoglycemic drugs: Secondary | ICD-10-CM | POA: Diagnosis not present

## 2018-02-08 DIAGNOSIS — E119 Type 2 diabetes mellitus without complications: Secondary | ICD-10-CM | POA: Diagnosis not present

## 2018-02-08 DIAGNOSIS — H25813 Combined forms of age-related cataract, bilateral: Secondary | ICD-10-CM | POA: Diagnosis not present

## 2018-02-09 ENCOUNTER — Ambulatory Visit: Payer: PPO | Admitting: Orthotics

## 2018-02-21 NOTE — Pre-Procedure Instructions (Signed)
[name]  02/21/2018      [facility] - [place], [place] [address] Phone: [phone] Fax: [place] [address], [place] [address] Phone: [phone] Fax: [phone]    Your procedure is scheduled on 03/03/2018.  Report to [facility] Admitting at [place].  Call this number if you have problems the morning of surgery:  [phone]   Remember:  Do not eat or drink after midnight.    Take these medicines the morning of surgery with A SIP OF WATER: albuterol (PROVENTIL HFA;VENTOLIN HFA) inhaler - if needed fexofenadine (ALLEGRA) - if needed fluticasone furoate-vilanterol (BREO ELLIPTA) HYDROcodone-acetaminophen (NORCO) - if needed metoprolol succinate (TOPROL-XL) omeprazole (PRILOSEC) - if needed topiramate (TOPAMAX)  7 days prior to surgery STOP taking any Aleve, Naproxen, Ibuprofen, Motrin, Advil, Goody's, BC's, all herbal medications, fish oil, and all vitamins  Follow your surgeon's instructions on when to stop Asprin.  If no instructions were given by your surgeon then you will need to call the office to get those instructions.     WHAT DO I DO ABOUT MY DIABETES MEDICATION?   Marland Kitchen Do not take oral diabetes medicines (pills) the morning of surgery.  . The day before your surgery, only take morning dose of your Glipizide (Glucotrol).  . THE NIGHT BEFORE SURGERY, take 30 units of Glargine insulin (Toujeo solostar).   . THE MORNING OF SURGERY, take 30 units of Glargine insulin (Toujeo solostar).     How to Manage Your Diabetes Before and After Surgery  Why is it important to control my blood sugar before and after surgery? . Improving blood sugar levels before and after surgery helps healing and can limit problems. . A way of improving blood sugar control is eating a healthy  diet by: o  Eating less sugar and carbohydrates o  Increasing activity/exercise o  Talking with your doctor about reaching your blood sugar goals . High blood sugars (greater than 180 mg/dL) can raise your risk of infections and slow your recovery, so you will need to focus on controlling your diabetes during the weeks before surgery. . Make sure that the doctor who takes care of your diabetes knows about your planned surgery including the date and location.  How do I manage my blood sugar before surgery? . Check your blood sugar at least 4 times a day, starting 2 days before surgery, to make sure that the level is not too high or low. o Check your blood sugar the morning of your surgery when you wake up and every 2 hours until you get to the Short Stay unit. . If your blood sugar is less than 70 mg/dL, you will need to treat for low blood sugar: o Do not take insulin. o Treat a low blood sugar (less than 70 mg/dL) with  cup of clear juice (cranberry or apple), 4 glucose tablets, OR glucose gel. o Recheck blood sugar in 15 minutes after treatment (to make sure it is greater than 70 mg/dL). If your blood sugar is not greater than 70 mg/dL on recheck, call [phone] for further instructions. . Report your blood sugar to the short stay nurse when you get to Short Stay.  . If you are admitted to the hospital after surgery: o Your blood sugar will be checked by the staff and you will  probably be given insulin after surgery (instead of oral diabetes medicines) to make sure you have good blood sugar levels. o The goal for blood sugar control after surgery is 80-180 mg/dL.     Do not wear jewelry, make-up or nail polish.  Do not wear lotions, powders, or perfumes, or deodorant.  Do not shave 48 hours prior to surgery.   Do not bring valuables to the hospital.  [facility] is not responsible for any belongings or valuables.  Contacts, eyeglasses, hearing aids, dentures or bridgework may not be  worn into surgery.  Leave your suitcase in the car.  After surgery it may be brought to your room.  For patients admitted to the hospital, discharge time will be determined by your treatment team.  Patients discharged the day of surgery will not be allowed to drive home.   Name and phone number of your driver:    Special instructions:   [place]- Preparing For Surgery  Before surgery, you can play an important role. Because skin is not sterile, your skin needs to be as free of germs as possible. You can reduce the number of germs on your skin by washing with CHG (chlorahexidine gluconate) Soap before surgery.  CHG is an antiseptic cleaner which kills germs and bonds with the skin to continue killing germs even after washing.    Oral Hygiene is also important to reduce your risk of infection.  Remember - BRUSH YOUR TEETH THE MORNING OF SURGERY WITH YOUR REGULAR TOOTHPASTE  Please do not use if you have an allergy to CHG or antibacterial soaps. If your skin becomes reddened/irritated stop using the CHG.  Do not shave (including legs and underarms) for at least 48 hours prior to first CHG shower. It is OK to shave your face.  Please follow these instructions carefully.   1. Shower the NIGHT BEFORE SURGERY and the MORNING OF SURGERY with CHG.   2. If you chose to wash your hair, wash your hair first as usual with your normal shampoo.  3. After you shampoo, rinse your hair and body thoroughly to remove the shampoo.  4. Use CHG as you would any other liquid soap. You can apply CHG directly to the skin and wash gently with a scrungie or a clean washcloth.   5. Apply the CHG Soap to your body ONLY FROM THE NECK DOWN.  Do not use on open wounds or open sores. Avoid contact with your eyes, ears, mouth and genitals (private parts). Wash Face and genitals (private parts)  with your normal soap.  6. Wash thoroughly, paying special attention to the area where your surgery will be  performed.  7. Thoroughly rinse your body with warm water from the neck down.  8. DO NOT shower/wash with your normal soap after using and rinsing off the CHG Soap.  9. Pat yourself dry with a CLEAN TOWEL.  10. Wear CLEAN PAJAMAS to bed the night before surgery, wear comfortable clothes the morning of surgery  11. Place CLEAN SHEETS on your bed the night of your first shower and DO NOT SLEEP WITH PETS.    Day of Surgery: Shower as stated above. Do not apply any deodorants/lotions.  Please wear clean clothes to the hospital/surgery center.   Remember to brush your teeth WITH YOUR REGULAR TOOTHPASTE.    Please read over the following fact sheets that you were given.

## 2018-02-22 ENCOUNTER — Encounter (HOSPITAL_COMMUNITY): Payer: Self-pay

## 2018-02-22 ENCOUNTER — Encounter (HOSPITAL_COMMUNITY)
Admission: RE | Admit: 2018-02-22 | Discharge: 2018-02-22 | Disposition: A | Discharge status: Home / Self Care | Payer: PPO | Source: Ambulatory Visit | Attending: Neurological Surgery | Admitting: Neurological Surgery

## 2018-02-22 ENCOUNTER — Ambulatory Visit (HOSPITAL_COMMUNITY)
Admission: RE | Admit: 2018-02-22 | Discharge: 2018-02-22 | Disposition: A | Discharge status: Home / Self Care | Payer: PPO | Source: Ambulatory Visit | Attending: Neurological Surgery | Admitting: Neurological Surgery

## 2018-02-22 ENCOUNTER — Other Ambulatory Visit: Payer: Self-pay

## 2018-02-22 DIAGNOSIS — Z01818 Encounter for other preprocedural examination: Secondary | ICD-10-CM | POA: Diagnosis not present

## 2018-02-22 DIAGNOSIS — J42 Unspecified chronic bronchitis: Secondary | ICD-10-CM | POA: Diagnosis not present

## 2018-02-22 DIAGNOSIS — M48061 Spinal stenosis, lumbar region without neurogenic claudication: Secondary | ICD-10-CM | POA: Insufficient documentation

## 2018-02-22 HISTORY — DX: Encounter for adjustment and management of neurostimulator: Z45.42

## 2018-02-22 HISTORY — DX: Polyneuropathy, unspecified: G62.9

## 2018-02-22 HISTORY — DX: Sleep apnea, unspecified: G47.30

## 2018-02-22 LAB — CBC WITH DIFFERENTIAL/PLATELET
Abs Immature Granulocytes: 0.02 10*3/uL (ref 0.00–0.07)
Basophils Absolute: 0 10*3/uL (ref 0.0–0.1)
Basophils Relative: 0 %
Eosinophils Absolute: 0.5 10*3/uL (ref 0.0–0.5)
Eosinophils Relative: 4 %
HCT: 42.2 % (ref 36.0–46.0)
Hemoglobin: 12.6 g/dL (ref 12.0–15.0)
Immature Granulocytes: 0 %
Lymphocytes Relative: 28 %
Lymphs Abs: 3.1 10*3/uL (ref 0.7–4.0)
MCH: 24.6 pg — ABNORMAL LOW (ref 26.0–34.0)
MCHC: 29.9 g/dL — ABNORMAL LOW (ref 30.0–36.0)
MCV: 82.4 fL (ref 80.0–100.0)
Monocytes Absolute: 1 10*3/uL (ref 0.1–1.0)
Monocytes Relative: 9 %
Neutro Abs: 6.4 10*3/uL (ref 1.7–7.7)
Neutrophils Relative %: 59 %
Platelets: 306 10*3/uL (ref 150–400)
RBC: 5.12 MIL/uL — ABNORMAL HIGH (ref 3.87–5.11)
RDW: 15.1 % (ref 11.5–15.5)
WBC: 10.9 10*3/uL — ABNORMAL HIGH (ref 4.0–10.5)
nRBC: 0 % (ref 0.0–0.2)

## 2018-02-22 LAB — SURGICAL PCR SCREEN
MRSA, PCR: NEGATIVE
Staphylococcus aureus: NEGATIVE

## 2018-02-22 LAB — BASIC METABOLIC PANEL
Anion gap: 8 (ref 5–15)
BUN: 34 mg/dL — ABNORMAL HIGH (ref 8–23)
CO2: 23 mmol/L (ref 22–32)
Calcium: 9.8 mg/dL (ref 8.9–10.3)
Chloride: 109 mmol/L (ref 98–111)
Creatinine, Ser: 1.35 mg/dL — ABNORMAL HIGH (ref 0.44–1.00)
GFR calc Af Amer: 43 mL/min — ABNORMAL LOW (ref >60)
GFR calc non Af Amer: 37 mL/min — ABNORMAL LOW (ref >60)
Glucose, Bld: 96 mg/dL (ref 70–99)
Potassium: 4.2 mmol/L (ref 3.5–5.1)
Sodium: 140 mmol/L (ref 135–145)

## 2018-02-22 LAB — PROTIME-INR
INR: 1.01
Prothrombin Time: 13.2 seconds (ref 11.4–15.2)

## 2018-02-22 LAB — GLUCOSE, CAPILLARY
Glucose-Capillary: 120 mg/dL — ABNORMAL HIGH (ref 70–99)
Glucose-Capillary: 91 mg/dL (ref 70–99)

## 2018-02-22 NOTE — Progress Notes (Signed)
PCP -Dr. [name]  Cardiologist - [name]  Chest x-ray - 02/22/18 EKG - 01/28/18 Stress Test - 04/01/16 ECHO - denies Cardiac Cath - denies  Sleep Study - reports mild sleep apnea, does not use CPAP   Fasting Blood Sugar - 90-130 Checks Blood Sugar 2 times a day  Aspirin Instructions: hold for 5 days prior to surgery  Anesthesia review: Yes, EKG review  Patient denies shortness of breath, fever, cough and chest pain at PAT appointment   Patient verbalized understanding of instructions that were given to them at the PAT appointment. Patient was also instructed that they will need to review over the PAT instructions again at home before surgery.

## 2018-03-02 DIAGNOSIS — Z85828 Personal history of other malignant neoplasm of skin: Secondary | ICD-10-CM | POA: Diagnosis not present

## 2018-03-02 DIAGNOSIS — L821 Other seborrheic keratosis: Secondary | ICD-10-CM | POA: Diagnosis not present

## 2018-03-02 DIAGNOSIS — L720 Epidermal cyst: Secondary | ICD-10-CM | POA: Diagnosis not present

## 2018-03-02 DIAGNOSIS — D224 Melanocytic nevi of scalp and neck: Secondary | ICD-10-CM | POA: Diagnosis not present

## 2018-03-02 NOTE — Anesthesia Preprocedure Evaluation (Addendum)
Anesthesia Evaluation  Patient identified by MRN, date of birth, ID band Patient awake    Reviewed: Allergy & Precautions, NPO status , Patient's Chart, lab work & pertinent test results  History of Anesthesia Complications Negative for: history of anesthetic complications  Airway Mallampati: III  TM Distance: >3 FB Neck ROM: Full    Dental no notable dental hx.    Pulmonary asthma , sleep apnea ,    Pulmonary exam normal breath sounds clear to auscultation       Cardiovascular hypertension, Pt. on medications + DVT  Normal cardiovascular exam Rhythm:Regular Rate:Normal  Normal MPS 04/01/16   Neuro/Psych negative neurological ROS  negative psych ROS   GI/Hepatic Neg liver ROS, PUD, GERD  Medicated,  Endo/Other  negative endocrine ROSdiabetes, Insulin Dependent  Renal/GU Renal InsufficiencyRenal disease  negative genitourinary   Musculoskeletal negative musculoskeletal ROS (+)   Abdominal   Peds  Hematology negative hematology ROS (+)   Anesthesia Other Findings Chronic pain, s/p spinal cord stimulator  Reproductive/Obstetrics                            Anesthesia Physical Anesthesia Plan  ASA: III  Anesthesia Plan: General   Post-op Pain Management:    Induction: Intravenous  PONV Risk Score and Plan: 3 and Ondansetron, Dexamethasone and Treatment may vary due to age or medical condition  Airway Management Planned: Oral ETT  Additional Equipment: None  Intra-op Plan:   Post-operative Plan: Extubation in OR  Informed Consent: I have reviewed the patients History and Physical, chart, labs and discussed the procedure including the risks, benefits and alternatives for the proposed anesthesia with the patient or authorized representative who has indicated his/her understanding and acceptance.     Plan Discussed with:   Anesthesia Plan Comments:        Anesthesia Quick  Evaluation

## 2018-03-03 ENCOUNTER — Inpatient Hospital Stay (HOSPITAL_COMMUNITY): Payer: PPO | Admitting: Anesthesiology

## 2018-03-03 ENCOUNTER — Encounter (HOSPITAL_COMMUNITY)
Admission: RE | Disposition: A | Discharge status: Home / Self Care | Payer: Self-pay | Source: Home / Self Care | Attending: Neurological Surgery

## 2018-03-03 ENCOUNTER — Encounter (HOSPITAL_COMMUNITY): Payer: Self-pay

## 2018-03-03 ENCOUNTER — Inpatient Hospital Stay (HOSPITAL_COMMUNITY)
Admission: RE | Admit: 2018-03-03 | Discharge: 2018-03-04 | DRG: 460 | Disposition: A | Discharge status: Home / Self Care | Payer: PPO | Attending: Neurological Surgery | Admitting: Neurological Surgery

## 2018-03-03 ENCOUNTER — Inpatient Hospital Stay (HOSPITAL_COMMUNITY): Payer: PPO

## 2018-03-03 DIAGNOSIS — Z981 Arthrodesis status: Secondary | ICD-10-CM | POA: Diagnosis not present

## 2018-03-03 DIAGNOSIS — Z79891 Long term (current) use of opiate analgesic: Secondary | ICD-10-CM

## 2018-03-03 DIAGNOSIS — Z794 Long term (current) use of insulin: Secondary | ICD-10-CM

## 2018-03-03 DIAGNOSIS — Z96651 Presence of right artificial knee joint: Secondary | ICD-10-CM | POA: Diagnosis not present

## 2018-03-03 DIAGNOSIS — M1990 Unspecified osteoarthritis, unspecified site: Secondary | ICD-10-CM | POA: Diagnosis present

## 2018-03-03 DIAGNOSIS — Z9889 Other specified postprocedural states: Secondary | ICD-10-CM

## 2018-03-03 DIAGNOSIS — Z9682 Presence of neurostimulator: Secondary | ICD-10-CM | POA: Diagnosis not present

## 2018-03-03 DIAGNOSIS — Z90710 Acquired absence of both cervix and uterus: Secondary | ICD-10-CM

## 2018-03-03 DIAGNOSIS — E1140 Type 2 diabetes mellitus with diabetic neuropathy, unspecified: Secondary | ICD-10-CM | POA: Diagnosis present

## 2018-03-03 DIAGNOSIS — Z8249 Family history of ischemic heart disease and other diseases of the circulatory system: Secondary | ICD-10-CM | POA: Diagnosis not present

## 2018-03-03 DIAGNOSIS — K219 Gastro-esophageal reflux disease without esophagitis: Secondary | ICD-10-CM | POA: Diagnosis present

## 2018-03-03 DIAGNOSIS — Z79899 Other long term (current) drug therapy: Secondary | ICD-10-CM

## 2018-03-03 DIAGNOSIS — G4730 Sleep apnea, unspecified: Secondary | ICD-10-CM | POA: Diagnosis not present

## 2018-03-03 DIAGNOSIS — Z419 Encounter for procedure for purposes other than remedying health state, unspecified: Secondary | ICD-10-CM

## 2018-03-03 DIAGNOSIS — Z886 Allergy status to analgesic agent status: Secondary | ICD-10-CM | POA: Diagnosis not present

## 2018-03-03 DIAGNOSIS — Z882 Allergy status to sulfonamides status: Secondary | ICD-10-CM | POA: Diagnosis not present

## 2018-03-03 DIAGNOSIS — Z7982 Long term (current) use of aspirin: Secondary | ICD-10-CM

## 2018-03-03 DIAGNOSIS — Z8711 Personal history of peptic ulcer disease: Secondary | ICD-10-CM | POA: Diagnosis not present

## 2018-03-03 DIAGNOSIS — Z87442 Personal history of urinary calculi: Secondary | ICD-10-CM | POA: Diagnosis not present

## 2018-03-03 DIAGNOSIS — Z888 Allergy status to other drugs, medicaments and biological substances status: Secondary | ICD-10-CM

## 2018-03-03 DIAGNOSIS — Z833 Family history of diabetes mellitus: Secondary | ICD-10-CM

## 2018-03-03 DIAGNOSIS — Z86718 Personal history of other venous thrombosis and embolism: Secondary | ICD-10-CM | POA: Diagnosis not present

## 2018-03-03 DIAGNOSIS — M48061 Spinal stenosis, lumbar region without neurogenic claudication: Secondary | ICD-10-CM | POA: Diagnosis not present

## 2018-03-03 DIAGNOSIS — I10 Essential (primary) hypertension: Secondary | ICD-10-CM | POA: Diagnosis present

## 2018-03-03 DIAGNOSIS — Z791 Long term (current) use of non-steroidal anti-inflammatories (NSAID): Secondary | ICD-10-CM

## 2018-03-03 HISTORY — PX: LUMBAR LAMINECTOMY/DECOMPRESSION MICRODISCECTOMY: SHX5026

## 2018-03-03 LAB — GLUCOSE, CAPILLARY
Glucose-Capillary: 101 mg/dL — ABNORMAL HIGH (ref 70–99)
Glucose-Capillary: 97 mg/dL (ref 70–99)
Glucose-Capillary: 293 mg/dL — ABNORMAL HIGH (ref 70–99)
Glucose-Capillary: 206 mg/dL — ABNORMAL HIGH (ref 70–99)

## 2018-03-03 SURGERY — LUMBAR LAMINECTOMY/DECOMPRESSION MICRODISCECTOMY 1 LEVEL
Anesthesia: General | Site: Back | Laterality: Bilateral

## 2018-03-03 MED ORDER — FENTANYL CITRATE (PF) 100 MCG/2ML IJ SOLN
INTRAMUSCULAR | Status: DC | PRN
  Administered 2018-03-03: 100 ug via INTRAVENOUS
  Administered 2018-03-03 (×2): 25 ug via INTRAVENOUS

## 2018-03-03 MED ORDER — PHENYLEPHRINE 40 MCG/ML (10ML) SYRINGE FOR IV PUSH (FOR BLOOD PRESSURE SUPPORT)
PREFILLED_SYRINGE | INTRAVENOUS | Status: DC | PRN
  Administered 2018-03-03: 120 ug via INTRAVENOUS
  Administered 2018-03-03: 80 ug via INTRAVENOUS

## 2018-03-03 MED ORDER — MORPHINE SULFATE (PF) 2 MG/ML IV SOLN: 2.0000 mg | INTRAVENOUS | Status: DC | PRN

## 2018-03-03 MED ORDER — ESTRADIOL 1 MG PO TABS
1.0000 mg | ORAL_TABLET | Freq: Every evening | ORAL | Status: DC
  Administered 2018-03-03: 1 mg via ORAL
  Filled 2018-03-03: qty 1

## 2018-03-03 MED ORDER — 0.9 % SODIUM CHLORIDE (POUR BTL) OPTIME
TOPICAL | Status: DC | PRN
  Administered 2018-03-03: 1000 mL

## 2018-03-03 MED ORDER — SODIUM CHLORIDE 0.9 % IV SOLN
INTRAVENOUS | Status: DC | PRN
  Administered 2018-03-03: 40 ug/min via INTRAVENOUS

## 2018-03-03 MED ORDER — TOPIRAMATE 25 MG PO TABS
25.0000 mg | ORAL_TABLET | Freq: Every day | ORAL | Status: DC
  Administered 2018-03-03: 25 mg via ORAL
  Filled 2018-03-03 (×2): qty 1

## 2018-03-03 MED ORDER — CEFAZOLIN SODIUM-DEXTROSE 2-4 GM/100ML-% IV SOLN
2.0000 g | Freq: Three times a day (TID) | INTRAVENOUS | Status: AC
  Administered 2018-03-03 (×2): 2 g via INTRAVENOUS
  Filled 2018-03-03 (×2): qty 100

## 2018-03-03 MED ORDER — HEMOSTATIC AGENTS (NO CHARGE) OPTIME
TOPICAL | Status: DC | PRN
  Administered 2018-03-03: 1 via TOPICAL

## 2018-03-03 MED ORDER — CHLORHEXIDINE GLUCONATE CLOTH 2 % EX PADS: 6.0000 | MEDICATED_PAD | Freq: Once | CUTANEOUS | Status: DC

## 2018-03-03 MED ORDER — HYDROCODONE-ACETAMINOPHEN 7.5-325 MG PO TABS
ORAL_TABLET | ORAL | Status: AC
  Filled 2018-03-03: qty 1

## 2018-03-03 MED ORDER — ACETAMINOPHEN 650 MG RE SUPP: 650.0000 mg | RECTAL | Status: DC | PRN

## 2018-03-03 MED ORDER — MIDAZOLAM HCL 2 MG/2ML IJ SOLN
INTRAMUSCULAR | Status: AC
  Filled 2018-03-03: qty 2

## 2018-03-03 MED ORDER — MELOXICAM 7.5 MG PO TABS
7.5000 mg | ORAL_TABLET | Freq: Every evening | ORAL | Status: DC
  Administered 2018-03-03: 7.5 mg via ORAL
  Filled 2018-03-03: qty 1

## 2018-03-03 MED ORDER — ONDANSETRON HCL 4 MG/2ML IJ SOLN: 4.0000 mg | Freq: Four times a day (QID) | INTRAMUSCULAR | Status: DC | PRN

## 2018-03-03 MED ORDER — EPHEDRINE SULFATE-NACL 50-0.9 MG/10ML-% IV SOSY
PREFILLED_SYRINGE | INTRAVENOUS | Status: DC | PRN
  Administered 2018-03-03: 10 mg via INTRAVENOUS
  Administered 2018-03-03: 15 mg via INTRAVENOUS
  Administered 2018-03-03: 10 mg via INTRAVENOUS
  Administered 2018-03-03: 15 mg via INTRAVENOUS

## 2018-03-03 MED ORDER — THROMBIN 5000 UNITS EX SOLR
CUTANEOUS | Status: AC
  Filled 2018-03-03: qty 5000

## 2018-03-03 MED ORDER — LIDOCAINE HCL (CARDIAC) PF 100 MG/5ML IV SOSY
PREFILLED_SYRINGE | INTRAVENOUS | Status: DC | PRN
  Administered 2018-03-03: 80 mg via INTRAVENOUS

## 2018-03-03 MED ORDER — SODIUM CHLORIDE 0.9% FLUSH: 3.0000 mL | INTRAVENOUS | Status: DC | PRN

## 2018-03-03 MED ORDER — ONDANSETRON HCL 4 MG/2ML IJ SOLN
INTRAMUSCULAR | Status: DC | PRN
  Administered 2018-03-03: 4 mg via INTRAVENOUS

## 2018-03-03 MED ORDER — METHOCARBAMOL 500 MG PO TABS
ORAL_TABLET | ORAL | Status: AC
  Filled 2018-03-03: qty 1

## 2018-03-03 MED ORDER — PHENOL 1.4 % MT LIQD: 1.0000 | OROMUCOSAL | Status: DC | PRN

## 2018-03-03 MED ORDER — ONDANSETRON HCL 4 MG/2ML IJ SOLN
INTRAMUSCULAR | Status: AC
  Filled 2018-03-03: qty 2

## 2018-03-03 MED ORDER — ONDANSETRON HCL 4 MG PO TABS: 4.0000 mg | ORAL_TABLET | Freq: Four times a day (QID) | ORAL | Status: DC | PRN

## 2018-03-03 MED ORDER — HYDROCODONE-ACETAMINOPHEN 7.5-325 MG PO TABS
1.0000 | ORAL_TABLET | ORAL | Status: DC | PRN
  Administered 2018-03-04: 2 via ORAL
  Filled 2018-03-03: qty 2

## 2018-03-03 MED ORDER — THROMBIN 5000 UNITS EX SOLR
OROMUCOSAL | Status: DC | PRN
  Administered 2018-03-03: 07:00:00 via TOPICAL

## 2018-03-03 MED ORDER — INSULIN ASPART 100 UNIT/ML ~~LOC~~ SOLN
0.0000 [IU] | Freq: Every day | SUBCUTANEOUS | Status: DC
  Administered 2018-03-03: 2 [IU] via SUBCUTANEOUS

## 2018-03-03 MED ORDER — SENNA 8.6 MG PO TABS
1.0000 | ORAL_TABLET | Freq: Two times a day (BID) | ORAL | Status: DC
  Administered 2018-03-03: 8.6 mg via ORAL
  Filled 2018-03-03: qty 1

## 2018-03-03 MED ORDER — POTASSIUM CHLORIDE CRYS ER 10 MEQ PO TBCR: 10.0000 meq | EXTENDED_RELEASE_TABLET | Freq: Every day | ORAL | Status: DC

## 2018-03-03 MED ORDER — FUROSEMIDE 20 MG PO TABS: 20.0000 mg | ORAL_TABLET | Freq: Every day | ORAL | Status: DC | PRN

## 2018-03-03 MED ORDER — GLIPIZIDE 10 MG PO TABS
10.0000 mg | ORAL_TABLET | Freq: Two times a day (BID) | ORAL | Status: DC
  Administered 2018-03-03 – 2018-03-04 (×2): 10 mg via ORAL
  Filled 2018-03-03 (×2): qty 1

## 2018-03-03 MED ORDER — ALBUTEROL SULFATE (2.5 MG/3ML) 0.083% IN NEBU: 3.0000 mL | INHALATION_SOLUTION | Freq: Four times a day (QID) | RESPIRATORY_TRACT | Status: DC | PRN

## 2018-03-03 MED ORDER — ROCURONIUM BROMIDE 10 MG/ML (PF) SYRINGE
PREFILLED_SYRINGE | INTRAVENOUS | Status: DC | PRN
  Administered 2018-03-03: 10 mg via INTRAVENOUS
  Administered 2018-03-03: 40 mg via INTRAVENOUS

## 2018-03-03 MED ORDER — HYDROCODONE-ACETAMINOPHEN 7.5-325 MG PO TABS
1.0000 | ORAL_TABLET | Freq: Three times a day (TID) | ORAL | Status: DC | PRN
  Administered 2018-03-03: 1 via ORAL
  Administered 2018-03-03: 2 via ORAL
  Filled 2018-03-03 (×2): qty 2

## 2018-03-03 MED ORDER — DEXAMETHASONE SODIUM PHOSPHATE 10 MG/ML IJ SOLN
INTRAMUSCULAR | Status: AC
  Filled 2018-03-03: qty 1

## 2018-03-03 MED ORDER — OXYCODONE HCL 5 MG PO TABS: 5.0000 mg | ORAL_TABLET | Freq: Once | ORAL | Status: DC | PRN

## 2018-03-03 MED ORDER — EPHEDRINE 5 MG/ML INJ
INTRAVENOUS | Status: AC
  Filled 2018-03-03: qty 10

## 2018-03-03 MED ORDER — METFORMIN HCL 500 MG PO TABS
1000.0000 mg | ORAL_TABLET | Freq: Two times a day (BID) | ORAL | Status: DC
  Administered 2018-03-03 – 2018-03-04 (×2): 1000 mg via ORAL
  Filled 2018-03-03 (×2): qty 2

## 2018-03-03 MED ORDER — SODIUM CHLORIDE 0.9% FLUSH
3.0000 mL | Freq: Two times a day (BID) | INTRAVENOUS | Status: DC
  Administered 2018-03-03: 3 mL via INTRAVENOUS

## 2018-03-03 MED ORDER — LOSARTAN POTASSIUM 50 MG PO TABS
100.0000 mg | ORAL_TABLET | Freq: Every evening | ORAL | Status: DC
  Administered 2018-03-03: 100 mg via ORAL
  Filled 2018-03-03: qty 2

## 2018-03-03 MED ORDER — METOPROLOL SUCCINATE ER 50 MG PO TB24
50.0000 mg | ORAL_TABLET | Freq: Two times a day (BID) | ORAL | Status: DC
  Administered 2018-03-03: 50 mg via ORAL
  Filled 2018-03-03: qty 1

## 2018-03-03 MED ORDER — METHOCARBAMOL 500 MG PO TABS
500.0000 mg | ORAL_TABLET | Freq: Four times a day (QID) | ORAL | Status: DC | PRN
  Administered 2018-03-03 – 2018-03-04 (×3): 500 mg via ORAL
  Filled 2018-03-03 (×2): qty 1

## 2018-03-03 MED ORDER — PROPOFOL 10 MG/ML IV BOLUS
INTRAVENOUS | Status: AC
  Filled 2018-03-03: qty 20

## 2018-03-03 MED ORDER — DEXAMETHASONE SODIUM PHOSPHATE 10 MG/ML IJ SOLN
INTRAMUSCULAR | Status: DC | PRN
  Administered 2018-03-03: 10 mg via INTRAVENOUS

## 2018-03-03 MED ORDER — INSULIN ASPART 100 UNIT/ML ~~LOC~~ SOLN
0.0000 [IU] | Freq: Three times a day (TID) | SUBCUTANEOUS | Status: DC
  Administered 2018-03-03: 8 [IU] via SUBCUTANEOUS

## 2018-03-03 MED ORDER — ONDANSETRON HCL 4 MG/2ML IJ SOLN: 4.0000 mg | Freq: Once | INTRAMUSCULAR | Status: DC | PRN

## 2018-03-03 MED ORDER — FLUTICASONE FUROATE-VILANTEROL 100-25 MCG/INH IN AEPB
2.0000 | INHALATION_SPRAY | Freq: Every day | RESPIRATORY_TRACT | Status: DC
  Administered 2018-03-04: 2 via RESPIRATORY_TRACT
  Filled 2018-03-03: qty 28

## 2018-03-03 MED ORDER — POTASSIUM CHLORIDE IN NACL 20-0.9 MEQ/L-% IV SOLN: INTRAVENOUS | Status: DC

## 2018-03-03 MED ORDER — ACETAMINOPHEN 325 MG PO TABS: 650.0000 mg | ORAL_TABLET | ORAL | Status: DC | PRN

## 2018-03-03 MED ORDER — CEFAZOLIN SODIUM-DEXTROSE 2-4 GM/100ML-% IV SOLN
2.0000 g | INTRAVENOUS | Status: AC
  Administered 2018-03-03: 2 g via INTRAVENOUS

## 2018-03-03 MED ORDER — PHENYLEPHRINE 40 MCG/ML (10ML) SYRINGE FOR IV PUSH (FOR BLOOD PRESSURE SUPPORT)
PREFILLED_SYRINGE | INTRAVENOUS | Status: AC
  Filled 2018-03-03: qty 10

## 2018-03-03 MED ORDER — BUPIVACAINE HCL (PF) 0.25 % IJ SOLN
INTRAMUSCULAR | Status: AC
  Filled 2018-03-03: qty 30

## 2018-03-03 MED ORDER — SODIUM CHLORIDE 0.9 % IV SOLN: 250.0000 mL | INTRAVENOUS | Status: DC

## 2018-03-03 MED ORDER — SUCCINYLCHOLINE CHLORIDE 200 MG/10ML IV SOSY
PREFILLED_SYRINGE | INTRAVENOUS | Status: AC
  Filled 2018-03-03: qty 10

## 2018-03-03 MED ORDER — MENTHOL 3 MG MT LOZG: 1.0000 | LOZENGE | OROMUCOSAL | Status: DC | PRN

## 2018-03-03 MED ORDER — BUPIVACAINE HCL (PF) 0.25 % IJ SOLN
INTRAMUSCULAR | Status: DC | PRN
  Administered 2018-03-03: 4 mL

## 2018-03-03 MED ORDER — FENTANYL CITRATE (PF) 250 MCG/5ML IJ SOLN
INTRAMUSCULAR | Status: AC
  Filled 2018-03-03: qty 5

## 2018-03-03 MED ORDER — THROMBIN 5000 UNITS EX SOLR
CUTANEOUS | Status: DC | PRN
  Administered 2018-03-03 (×2): 5000 [IU] via TOPICAL

## 2018-03-03 MED ORDER — CEFAZOLIN SODIUM-DEXTROSE 2-4 GM/100ML-% IV SOLN
INTRAVENOUS | Status: AC
  Filled 2018-03-03: qty 100

## 2018-03-03 MED ORDER — INSULIN ASPART 100 UNIT/ML ~~LOC~~ SOLN
0.0000 [IU] | Freq: Three times a day (TID) | SUBCUTANEOUS | Status: DC
  Administered 2018-03-04: 3 [IU] via SUBCUTANEOUS

## 2018-03-03 MED ORDER — THROMBIN 5000 UNITS EX SOLR
CUTANEOUS | Status: AC
  Filled 2018-03-03: qty 10000

## 2018-03-03 MED ORDER — FENTANYL CITRATE (PF) 100 MCG/2ML IJ SOLN: 25.0000 ug | INTRAMUSCULAR | Status: DC | PRN

## 2018-03-03 MED ORDER — LACTATED RINGERS IV SOLN
INTRAVENOUS | Status: DC | PRN
  Administered 2018-03-03 (×2): via INTRAVENOUS

## 2018-03-03 MED ORDER — SODIUM CHLORIDE 0.9 % IV SOLN
INTRAVENOUS | Status: DC | PRN
  Administered 2018-03-03: 07:00:00

## 2018-03-03 MED ORDER — PROPOFOL 10 MG/ML IV BOLUS
INTRAVENOUS | Status: DC | PRN
  Administered 2018-03-03: 150 mg via INTRAVENOUS

## 2018-03-03 MED ORDER — METHOCARBAMOL 1000 MG/10ML IJ SOLN
500.0000 mg | Freq: Four times a day (QID) | INTRAVENOUS | Status: DC | PRN
  Filled 2018-03-03: qty 5

## 2018-03-03 MED ORDER — SUGAMMADEX SODIUM 200 MG/2ML IV SOLN
INTRAVENOUS | Status: DC | PRN
  Administered 2018-03-03: 200 mg via INTRAVENOUS

## 2018-03-03 MED ORDER — OXYCODONE HCL 5 MG/5ML PO SOLN: 5.0000 mg | Freq: Once | ORAL | Status: DC | PRN

## 2018-03-03 SURGICAL SUPPLY — 50 items
BAG DECANTER FOR FLEXI CONT (MISCELLANEOUS) ×2 IMPLANT
BENZOIN TINCTURE PRP APPL 2/3 (GAUZE/BANDAGES/DRESSINGS) ×2 IMPLANT
BUR MATCHSTICK NEURO 3.0 LAGG (BURR) ×2 IMPLANT
CANISTER SUCT 3000ML PPV (MISCELLANEOUS) ×2 IMPLANT
CARTRIDGE OIL MAESTRO DRILL (MISCELLANEOUS) ×1 IMPLANT
COVER WAND RF STERILE (DRAPES) IMPLANT
DERMABOND ADVANCED (GAUZE/BANDAGES/DRESSINGS) ×1
DERMABOND ADVANCED .7 DNX12 (GAUZE/BANDAGES/DRESSINGS) ×1 IMPLANT
DIFFUSER DRILL AIR PNEUMATIC (MISCELLANEOUS) ×2 IMPLANT
DRAPE LAPAROTOMY 100X72X124 (DRAPES) ×2 IMPLANT
DRAPE MICROSCOPE LEICA (MISCELLANEOUS) IMPLANT
DRAPE POUCH INSTRU U-SHP 10X18 (DRAPES) ×2 IMPLANT
DRAPE SURG 17X23 STRL (DRAPES) ×2 IMPLANT
DRSG OPSITE POSTOP 4X6 (GAUZE/BANDAGES/DRESSINGS) ×2 IMPLANT
DURAPREP 26ML APPLICATOR (WOUND CARE) ×2 IMPLANT
ELECT REM PT RETURN 9FT ADLT (ELECTROSURGICAL) ×2
ELECTRODE REM PT RTRN 9FT ADLT (ELECTROSURGICAL) ×1 IMPLANT
GAUZE 4X4 16PLY RFD (DISPOSABLE) IMPLANT
GLOVE BIO SURGEON STRL SZ7 (GLOVE) IMPLANT
GLOVE BIO SURGEON STRL SZ8 (GLOVE) ×2 IMPLANT
GLOVE BIOGEL PI IND STRL 7.0 (GLOVE) IMPLANT
GLOVE BIOGEL PI IND STRL 7.5 (GLOVE) ×2 IMPLANT
GLOVE BIOGEL PI INDICATOR 7.0 (GLOVE)
GLOVE BIOGEL PI INDICATOR 7.5 (GLOVE) ×2
GLOVE SURG SS PI 7.0 STRL IVOR (GLOVE) ×2 IMPLANT
GLOVE SURG SS PI 7.5 STRL IVOR (GLOVE) ×4 IMPLANT
GOWN STRL REUS W/ TWL LRG LVL3 (GOWN DISPOSABLE) ×2 IMPLANT
GOWN STRL REUS W/ TWL XL LVL3 (GOWN DISPOSABLE) ×2 IMPLANT
GOWN STRL REUS W/TWL 2XL LVL3 (GOWN DISPOSABLE) IMPLANT
GOWN STRL REUS W/TWL LRG LVL3 (GOWN DISPOSABLE) ×2
GOWN STRL REUS W/TWL XL LVL3 (GOWN DISPOSABLE) ×2
HEMOSTAT POWDER SURGIFOAM 1G (HEMOSTASIS) ×2 IMPLANT
KIT BASIN OR (CUSTOM PROCEDURE TRAY) ×2 IMPLANT
KIT TURNOVER KIT B (KITS) ×2 IMPLANT
NEEDLE HYPO 25X1 1.5 SAFETY (NEEDLE) ×2 IMPLANT
NEEDLE SPNL 20GX3.5 QUINCKE YW (NEEDLE) ×2 IMPLANT
NS IRRIG 1000ML POUR BTL (IV SOLUTION) ×2 IMPLANT
OIL CARTRIDGE MAESTRO DRILL (MISCELLANEOUS) ×2
PACK LAMINECTOMY NEURO (CUSTOM PROCEDURE TRAY) ×2 IMPLANT
PAD ARMBOARD 7.5X6 YLW CONV (MISCELLANEOUS) ×6 IMPLANT
RUBBERBAND STERILE (MISCELLANEOUS) IMPLANT
SPONGE SURGIFOAM ABS GEL SZ50 (HEMOSTASIS) ×2 IMPLANT
STRIP CLOSURE SKIN 1/2X4 (GAUZE/BANDAGES/DRESSINGS) ×2 IMPLANT
SUT VIC AB 0 CT1 18XCR BRD8 (SUTURE) ×1 IMPLANT
SUT VIC AB 0 CT1 8-18 (SUTURE) ×1
SUT VIC AB 2-0 CP2 18 (SUTURE) ×2 IMPLANT
SUT VIC AB 3-0 SH 8-18 (SUTURE) ×2 IMPLANT
TOWEL GREEN STERILE (TOWEL DISPOSABLE) ×2 IMPLANT
TOWEL GREEN STERILE FF (TOWEL DISPOSABLE) ×2 IMPLANT
WATER STERILE IRR 1000ML POUR (IV SOLUTION) ×2 IMPLANT

## 2018-03-03 NOTE — Anesthesia Postprocedure Evaluation (Signed)
Anesthesia Post Note  Patient: [name]  Procedure(s) Performed: Laminectomy and Foraminotomy - Lumbar one-Lumbar two - bilateral Posterior lateral fusion (Bilateral Back)     Patient location during evaluation: PACU Anesthesia Type: General Level of consciousness: awake and alert Pain management: pain level controlled Vital Signs Assessment: post-procedure vital signs reviewed and stable Respiratory status: spontaneous breathing, nonlabored ventilation and respiratory function stable Cardiovascular status: blood pressure returned to baseline and stable Postop Assessment: no apparent nausea or vomiting Anesthetic complications: no    Last Vitals:  Vitals:   03/03/18 1000 03/03/18 1021  BP:  136/83  Pulse: 89 73  Resp: (!) 8 16  Temp:    SpO2: 98% 98%    Last Pain:  Vitals:   03/03/18 1020  PainSc: 6                  Donnisha Besecker E Tristian Bouska

## 2018-03-03 NOTE — H&P (Signed)
Subjective: Patient is a 77 y.o. female admitted for LL for stenosis. Onset of symptoms was several months ago, gradually worsening since that time.  The pain is rated severe, and is located at the across the lower back and radiates to legs L>R. The pain is described as aching and occurs all day. The symptoms have been progressive. Symptoms are exacerbated by exercise. MRI or CT showed stenosis L1-2   Past Medical History:  Diagnosis Date  . Anemia   . Arthritis   . Battery end of life of spinal cord stimulator 2016   battery replaced.   . Diabetes mellitus   . DVT (deep venous thrombosis) ([place])   . Dyspnea    on exertion  . GERD (gastroesophageal reflux disease)   . History of kidney stones   . History of stomach ulcers   . Hypertension    dr Marlou Porch  . Neuropathy   . Rapid heartbeat   . Sleep apnea    borderline study; could use a CPAP if desired.     Past Surgical History:  Procedure Laterality Date  . ABDOMINAL HYSTERECTOMY  1979 & 1981   ovaries and uterus removed at different times.  . APPENDECTOMY    . ARTHOSCOPIC ROTAOR CUFF REPAIR Left 2010 or 2011  . BACK SURGERY  x 2 1999 and 2008   lower back L 4 to L 5 L5 to S 1  . BREAST BIOPSY Bilateral 05/18/1997  . CARPAL TUNNEL RELEASE Right 2014  . CARPAL TUNNEL RELEASE Bilateral   . COLONOSCOPY WITH PROPOFOL N/A 10/25/2012   Procedure: COLONOSCOPY WITH PROPOFOL;  Surgeon: [name], MD;  Location: [facility];  Service: Endoscopy;  Laterality: N/A;  . EYE SURGERY     eyelid surgery  . JOINT REPLACEMENT Right 2012   knee  . KNEE ARTHROSCOPY Right 2004  . ovarian wedge resection  1966  . SPINAL CORD STIMULATOR INSERTION N/A 11/03/2012   Procedure: LUMBAR SPINAL CORD STIMULATOR INSERTION;  Surgeon: [name], MD;  Location: [place];  Service: Orthopedics;  Laterality: N/A;  . SPINAL FUSION  10/2016  . TONSILLECTOMY  1960    Prior to Admission medications   Medication Sig Start Date End Date Taking? Authorizing  Provider  albuterol (PROVENTIL HFA;VENTOLIN HFA) 108 (90 Base) MCG/ACT inhaler Inhale 1 puff into the lungs every 6 (six) hours as needed for wheezing or shortness of breath.   Yes [provider]  aspirin EC 81 MG tablet Take 81 mg by mouth daily.   Yes [provider]  B Complex-C (SUPER B COMPLEX PO) Take 1 tablet by mouth every evening.    Yes [provider]  Biotin 5000 MCG TABS Take 5,000 mg by mouth every evening.    Yes [provider]  Calcium-Magnesium-Vitamin D (CALCIUM 1200+D3 PO) Take 1 tablet by mouth every evening.   Yes [provider]  Co-Enzyme Q10 200 MG CAPS Take 400 mg by mouth every evening.    Yes [provider]  estradiol (ESTRACE) 1 MG tablet Take 1 mg by mouth every evening.    Yes [provider]  fluticasone furoate-vilanterol (BREO ELLIPTA) 100-25 MCG/INH AEPB Inhale 2 puffs into the lungs daily.    Yes [provider]  glipiZIDE (GLUCOTROL) 10 MG tablet Take 10 mg by mouth 2 (two) times daily. 08/03/16  Yes [provider]  HYDROcodone-acetaminophen (NORCO) 7.5-325 MG tablet Take 1-2 tablets by mouth every 4 (four) hours as needed (for pain.). Patient taking differently:  Take 1-2 tablets by mouth 3 (three) times daily as needed for moderate pain.  10/11/16  Yes Costella, Vista Mink, PA-C  Insulin Glargine (TOUJEO SOLOSTAR) 300 UNIT/ML SOPN Inject 60 Units into the skin daily.    Yes [provider]  losartan (COZAAR) 100 MG tablet Take 100 mg by mouth every evening.    Yes [provider]  Magnesium 500 MG TABS Take 1,000 mg by mouth daily.    Yes [provider]  meloxicam (MOBIC) 7.5 MG tablet Take 7.5 mg by mouth every evening.    Yes [provider]  metFORMIN (GLUCOPHAGE) 1000 MG tablet Take 1,000 mg by mouth 2 (two) times daily.    Yes [provider]  metoprolol succinate (TOPROL-XL) 50 MG 24 hr tablet Take 1 tablet (50 mg total) by mouth 2  (two) times daily. 05/20/15  Yes Jerline Pain, MD  Multiple Vitamins-Minerals (MULTIVITAMIN WITH MINERALS) tablet Take 1 tablet by mouth every evening.    Yes [provider]  Omega-3 Fatty Acids (FISH OIL) 1200 MG CAPS Take 2,400 mg by mouth 2 (two) times daily.    Yes [provider]  omeprazole (PRILOSEC) 20 MG capsule Take 20 mg by mouth daily as needed (for acid reflux).   Yes [provider]  potassium chloride (K-DUR,KLOR-CON) 10 MEQ tablet Take 10 mEq by mouth daily.    Yes [provider]  rosuvastatin (CRESTOR) 10 MG tablet Take 10 mg by mouth every evening.    Yes [provider]  topiramate (TOPAMAX) 25 MG tablet Take 25 mg by mouth daily.   Yes [provider]  fexofenadine (ALLEGRA) 180 MG tablet Take 180 mg by mouth daily as needed for allergies or rhinitis.    [provider]  furosemide (LASIX) 20 MG tablet Take 20 mg by mouth daily as needed for fluid.  12/28/17   [provider]   Allergies  Allergen Reactions  . Hydrochlorothiazide Anaphylaxis and Other (See Comments)    comatose  . Ace Inhibitors Cough  . Ezetimibe-Simvastatin Other (See Comments)    MYALGIA  . Indomethacin Other (See Comments)    PEPTIC ULCERS  . Lisinopril Cough  . Lyrica [Pregabalin] Other (See Comments)    Weight gain, drowsy  . Prednisone Itching, Swelling and Other (See Comments)    Facial redness/swelling   . Simvastatin Other (See Comments)    Elevated LFT's  . Vioxx [Rofecoxib] Other (See Comments)    Anxiety & chills  . Troglitazone Other (See Comments)    Pt is unaware of reaction.  Marland Kitchen Antihistamines, Chlorpheniramine-Type Other (See Comments)    "HYPER"    Social History   Tobacco Use  . Smoking status: Never Smoker  . Smokeless tobacco: Never Used  Substance Use Topics  . Alcohol use: No    Family History  Problem Relation Age of Onset  . Heart disease Father   . Diabetes Mother      Review of  Systems  Positive ROS: neg  All other systems have been reviewed and were otherwise negative with the exception of those mentioned in the HPI and as above.  Objective: Vital signs in last 24 hours: Temp:  [97.7 F (36.5 C)] 97.7 F (36.5 C) (10/24 0643) Pulse Rate:  [95] 95 (10/24 0643) Resp:  [20] 20 (10/24 0643) BP: (146)/(65) 146/65 (10/24 0643) SpO2:  [92 %] 92 % (10/24 0643) Weight:  [91.2 kg] 91.2 kg (10/24 0626)  General Appearance: Alert, cooperative, no distress,  appears stated age Head: Normocephalic, without obvious abnormality, atraumatic Eyes: PERRL, conjunctiva/corneas clear, EOM's intact    Neck: Supple, symmetrical, trachea midline Back: Symmetric, no curvature, ROM normal, no CVA tenderness Lungs:  respirations unlabored Heart: Regular rate and rhythm Abdomen: Soft, non-tender Extremities: Extremities normal, atraumatic, no cyanosis or edema Pulses: 2+ and symmetric all extremities Skin: Skin color, texture, turgor normal, no rashes or lesions  NEUROLOGIC:   Mental status: Alert and oriented x4,  no aphasia, good attention span, fund of knowledge, and memory Motor Exam - grossly normal Sensory Exam - grossly normal Reflexes: 1+ Coordination - grossly normal Gait - not tested Balance - grossly normal Cranial Nerves: I: smell Not tested  II: visual acuity  OS: nl    OD: nl  II: visual fields Full to confrontation  II: pupils Equal, round, reactive to light  III,VII: ptosis None  III,IV,VI: extraocular muscles  Full ROM  V: mastication Normal  V: facial light touch sensation  Normal  V,VII: corneal reflex  Present  VII: facial muscle function - upper  Normal  VII: facial muscle function - lower Normal  VIII: hearing Not tested  IX: soft palate elevation  Normal  IX,X: gag reflex Present  XI: trapezius strength  5/5  XI: sternocleidomastoid strength 5/5  XI: neck flexion strength  5/5  XII: tongue strength  Normal    Data Review Lab Results   Component Value Date   WBC 10.9 (H) 02/22/2018   HGB 12.6 02/22/2018   HCT 42.2 02/22/2018   MCV 82.4 02/22/2018   PLT 306 02/22/2018   Lab Results  Component Value Date   NA 140 02/22/2018   K 4.2 02/22/2018   CL 109 02/22/2018   CO2 23 02/22/2018   BUN 34 (H) 02/22/2018   CREATININE 1.35 (H) 02/22/2018   GLUCOSE 96 02/22/2018   Lab Results  Component Value Date   INR 1.01 02/22/2018    Assessment/Plan:  Estimated body mass index is 33.46 kg/m as calculated from the following:   Height as of this encounter: 5\' 5"  (1.651 m).   Weight as of this encounter: 91.2 kg. Patient admitted for L1-2 stenosis. Patient has failed a reasonable attempt at conservative therapy.  I explained the condition and procedure to the patient and answered any questions.  Patient wishes to proceed with procedure as planned. Understands risks/ benefits and typical outcomes of procedure.   Joyanna Kleman S 03/03/2018 7:36 AM

## 2018-03-03 NOTE — Anesthesia Procedure Notes (Signed)
Procedure Name: Intubation Date/Time: 03/03/2018 7:50 AM Performed by: Carney Living, CRNA Pre-anesthesia Checklist: Patient identified, Emergency Drugs available, Suction available, Patient being monitored and Timeout performed Patient Re-evaluated:Patient Re-evaluated prior to induction Oxygen Delivery Method: Circle system utilized Preoxygenation: Pre-oxygenation with 100% oxygen Induction Type: IV induction Ventilation: Mask ventilation without difficulty and Oral airway inserted - appropriate to patient size Laryngoscope Size: Mac and 4 Grade View: Grade I Tube type: Oral Tube size: 7.0 mm Number of attempts: 1 Airway Equipment and Method: Stylet Placement Confirmation: ETT inserted through vocal cords under direct vision,  positive ETCO2 and breath sounds checked- equal and bilateral Secured at: 22 cm Tube secured with: Tape Dental Injury: Teeth and Oropharynx as per pre-operative assessment

## 2018-03-03 NOTE — Transfer of Care (Signed)
Immediate Anesthesia Transfer of Care Note  Patient: [name]  Procedure(s) Performed: Laminectomy and Foraminotomy - Lumbar one-Lumbar two - bilateral Posterior lateral fusion (Bilateral Back)  Patient Location: PACU  Anesthesia Type:General  Level of Consciousness: awake, alert , oriented and patient cooperative  Airway & Oxygen Therapy: Patient Spontanous Breathing and Patient connected to nasal cannula oxygen  Post-op Assessment: Report given to RN, Post -op Vital signs reviewed and stable and Patient moving all extremities X 4  Post vital signs: Reviewed and stable  Last Vitals:  Vitals Value Taken Time  BP 124/82 03/03/2018  9:26 AM  Temp    Pulse 99 03/03/2018  9:27 AM  Resp 11 03/03/2018  9:27 AM  SpO2 98 % 03/03/2018  9:27 AM  Vitals shown include unvalidated device data.  Last Pain:  Vitals:   03/03/18 0622  PainSc: 8       Patients Stated Pain Goal: 3 (15/61/53 7943)  Complications: No apparent anesthesia complications

## 2018-03-03 NOTE — Op Note (Signed)
03/03/2018  9:03 AM  PATIENT:  [name]  77 y.o. female  PRE-OPERATIVE DIAGNOSIS: Lumbar spinal stenosis L1-2 with back and leg pain adjacent to previous L2-S1 fusion  POST-OPERATIVE DIAGNOSIS:  same  PROCEDURE: 1.  Right L1-2 posterior lateral fusion utilizing locally harvested morselized autograft, 2. L1 -2 decompressive laminectomy, medial facetectomyies and foraminotomies bilaterally for central canal and lateral recess decompression  SURGEON:  Sherley Bounds, MD  ASSISTANTS: Glenford Peers FNP  ANESTHESIA:   General  EBL: Less than 50 ml  Total I/O In: 1000 [I.V.:1000] Out: -   BLOOD ADMINISTERED: none  DRAINS: None  SPECIMEN:  none  INDICATION FOR PROCEDURE: This patient presented with back and bilateral leg pain. Imaging showed a very adjacent level stenosis L1-2. The patient tried conservative measures without relief. Pain was debilitating. Recommended decompressive laminectomy with only a non-instrumented fusion to hopefully try to avoid thoracolumbar junctional kyphosis/spondylosis/stenosis from extending the instrumented fusion to L1. Patient understood the risks, benefits, and alternatives and potential outcomes and wished to proceed.  PROCEDURE DETAILS: The patient was taken to the operating room and after induction of adequate generalized endotracheal anesthesia, the patient was rolled into the prone position on the Wilson frame and all pressure points were padded. The lumbar region was cleaned and then prepped with DuraPrep and draped in the usual sterile fashion. 5 cc of local anesthesia was injected and then a dorsal midline incision was made and carried down to the lumbo sacral fascia. The fascia was opened and the paraspinous musculature was taken down in a subperiosteal fashion to expose L1-2 bilaterally.  Could see the top of the previously placed instrumentation. Intraoperative x-ray confirmed my level, and then I moved the inferior half of the spinous  process of L1 and used a combination of the high-speed drill and the Kerrison punches to perform laminectomy, medial facetectomy, and foraminotomy at 1-2 bilaterally. The underlying yellow ligament was opened and removed in a piecemeal fashion to expose the underlying dura and exiting nerve roots. I undercut the lateral recess and dissected down until I was medial to and distal to the pedicle bilaterally. The nerve root was well decompressed the central canal appeared to be full and capacious.  I then palpated with a coronary dilator along the nerve root and into the foramen to assure adequate decompression. I felt no more compression of the nerve root on either side. I irrigated with saline solution containing bacitracin.  I drilled the lateral part of the pars and the lateral part of the facet on the patient's right-hand side and placed our locally harvested morselized autologous bone graft to perform arthrodesis at L1-2 on the right. I achieved hemostasis with bipolar cautery, lined the dura with Gelfoam, and then closed the fascia with 0 Vicryl. I closed the subcutaneous tissues with 2-0 Vicryl and the subcuticular tissues with 3-0 Vicryl. The skin was then closed with benzoin and Steri-Strips. The drapes were removed, a sterile dressing was applied. The patient was awakened from general anesthesia and transferred to the recovery room in stable condition. At the end of the procedure all sponge, needle and instrument counts were correct.    PLAN OF CARE: Admit for overnight observation  PATIENT DISPOSITION:  PACU - hemodynamically stable.   Delay start of Pharmacological VTE agent (>24hrs) due to surgical blood loss or risk of bleeding:  yes

## 2018-03-04 ENCOUNTER — Encounter (HOSPITAL_COMMUNITY): Payer: Self-pay | Admitting: Neurological Surgery

## 2018-03-04 LAB — GLUCOSE, CAPILLARY: Glucose-Capillary: 192 mg/dL — ABNORMAL HIGH (ref 70–99)

## 2018-03-04 MED ORDER — METHOCARBAMOL 500 MG PO TABS: 500.0000 mg | ORAL_TABLET | Freq: Four times a day (QID) | ORAL | 0 refills | Status: DC | PRN

## 2018-03-04 MED ORDER — HYDROCODONE-ACETAMINOPHEN 5-325 MG PO TABS: 1.0000 | ORAL_TABLET | ORAL | 0 refills | Status: DC | PRN

## 2018-03-04 NOTE — Progress Notes (Signed)
Pt doing well. Pt and husband given D/C instructions with Rx's, verbal understanding was provided. Pt's incision is clean and dry with no sign of infection. Pt's IV was removed prior to D/C. Pt D/C'd home via wheelchair per MD order. Pt is stable @ D/C and has no other needs at this time. Tia Gelb, RN  

## 2018-03-04 NOTE — Discharge Summary (Signed)
Physician Discharge Summary  Patient ID: [name] MRN: [id number] DOB/AGE: 1940/08/14 77 y.o.  Admit date: 03/03/2018 Discharge date: 03/04/2018  Admission Diagnoses: lumbar stenosis    Discharge Diagnoses: same   Discharged Condition: good  Hospital Course: The patient was admitted on 03/03/2018 and taken to the operating room where the patient underwent LL L1-2 . The patient tolerated the procedure well and was taken to the recovery room and then to the floor in stable condition. The hospital course was routine. There were no complications. The wound remained clean dry and intact. Pt had appropriate back soreness. No complaints of leg pain or new N/T/W. The patient remained afebrile with stable vital signs, and tolerated a regular diet. The patient continued to increase activities, and pain was well controlled with oral pain medications.   Consults: None  Significant Diagnostic Studies:  Results for orders placed or performed during the hospital encounter of 03/03/18  Glucose, capillary  Result Value Ref Range   Glucose-Capillary 101 (H) 70 - 99 mg/dL  Glucose, capillary  Result Value Ref Range   Glucose-Capillary 97 70 - 99 mg/dL  Glucose, capillary  Result Value Ref Range   Glucose-Capillary 293 (H) 70 - 99 mg/dL  Glucose, capillary  Result Value Ref Range   Glucose-Capillary 206 (H) 70 - 99 mg/dL   Comment 1 Notify RN    Comment 2 Document in Chart   Glucose, capillary  Result Value Ref Range   Glucose-Capillary 192 (H) 70 - 99 mg/dL   Comment 1 Notify RN    Comment 2 Document in Chart     Chest 2 View  Result Date: 02/23/2018 CLINICAL DATA:  Preoperative examination prior laminectomy and foraminotomy. EXAM: CHEST - 2 VIEW COMPARISON:  Chest x-ray of Sep 30, 2017. FINDINGS: The lungs are mildly hyperinflated with hemidiaphragm flattening. There is no focal infiltrate. There is no pleural effusion. The heart and pulmonary vascularity are normal. There is  nerve stimulator electrode projecting from the inferior aspect of T9 to the upper aspect of T11. IMPRESSION: Mild chronic bronchitic changes. No pneumonia, CHF, nor other acute cardiopulmonary abnormality. Electronically Signed   By: Sangita Zani  [place] M.D.   On: 02/23/2018 07:47   Dg Lumbar Spine 2-3 Views  Result Date: 03/03/2018 CLINICAL DATA:  Intraoperative localization EXAM: LUMBAR SPINE - 2-3 VIEW COMPARISON:  12/15/2017 FINDINGS: Initial lateral view of the lumbar spine reveals a needle in the posterior soft tissues at the L1-2 level. Changes of prior fusion from L2-L4 are seen. Subsequent image shows surgical retractors and instruments at the L1-2 level. IMPRESSION: Intraoperative localization at L1-2 Electronically Signed   By: Inez Catalina M.D.   On: 03/03/2018 15:53   Dg Foot 2 Views Left  Result Date: 02/02/2018 Please see detailed radiograph report in office note.  Dg Foot 2 Views Right  Result Date: 02/02/2018 Please see detailed radiograph report in office note.   Antibiotics:  Anti-infectives (From admission, onward)   Start     Dose/Rate Route Frequency Ordered Stop   03/03/18 1600  ceFAZolin (ANCEF) IVPB 2g/100 mL premix     2 g 200 mL/hr over 30 Minutes Intravenous Every 8 hours 03/03/18 1023 03/04/18 0021   03/03/18 0724  bacitracin 50,000 Units in sodium chloride 0.9 % 500 mL irrigation  Status:  Discontinued       As needed 03/03/18 0835 03/03/18 0922   03/03/18 0616  ceFAZolin (ANCEF) 2-4 GM/100ML-% IVPB    Note to Pharmacy:  Barbie Haggis   :  cabinet override      03/03/18 0616 03/03/18 0806   03/03/18 0615  ceFAZolin (ANCEF) IVPB 2g/100 mL premix     2 g 200 mL/hr over 30 Minutes Intravenous On call to O.R. 03/03/18 0612 03/03/18 6789      Discharge Exam: Blood pressure 136/66, pulse 82, temperature 98.2 F (36.8 C), temperature source Oral, resp. rate 16, height 5\' 5"  (1.651 m), weight 91.2 kg, SpO2 100 %. Neurologic: Grossly normal Dressing  dry  Discharge Medications:   Allergies as of 03/04/2018      Reactions   Hydrochlorothiazide Anaphylaxis, Other (See Comments)   comatose   Ace Inhibitors Cough   Ezetimibe-simvastatin Other (See Comments)   MYALGIA   Indomethacin Other (See Comments)   PEPTIC ULCERS   Lisinopril Cough   Lyrica [pregabalin] Other (See Comments)   Weight gain, drowsy   Prednisone Itching, Swelling, Other (See Comments)   Facial redness/swelling    Simvastatin Other (See Comments)   Elevated LFT's   Vioxx [rofecoxib] Other (See Comments)   Anxiety & chills   Troglitazone Other (See Comments)   Pt is unaware of reaction.   Antihistamines, Chlorpheniramine-type Other (See Comments)   "HYPER"      Medication List    STOP taking these medications   HYDROcodone-acetaminophen 7.5-325 MG tablet Commonly known as:  NORCO Replaced by:  HYDROcodone-acetaminophen 5-325 MG tablet     TAKE these medications   albuterol 108 (90 Base) MCG/ACT inhaler Commonly known as:  PROVENTIL HFA;VENTOLIN HFA Inhale 1 puff into the lungs every 6 (six) hours as needed for wheezing or shortness of breath.   aspirin EC 81 MG tablet Take 81 mg by mouth daily.   Biotin 5000 MCG Tabs Take 5,000 mg by mouth every evening.   BREO ELLIPTA 100-25 MCG/INH Aepb Generic drug:  fluticasone furoate-vilanterol Inhale 2 puffs into the lungs daily.   CALCIUM 1200+D3 PO Take 1 tablet by mouth every evening.   Co-Enzyme Q10 200 MG Caps Take 400 mg by mouth every evening.   estradiol 1 MG tablet Commonly known as:  ESTRACE Take 1 mg by mouth every evening.   fexofenadine 180 MG tablet Commonly known as:  ALLEGRA Take 180 mg by mouth daily as needed for allergies or rhinitis.   Fish Oil 1200 MG Caps Take 2,400 mg by mouth 2 (two) times daily.   furosemide 20 MG tablet Commonly known as:  LASIX Take 20 mg by mouth daily as needed for fluid.   glipiZIDE 10 MG tablet Commonly known as:  GLUCOTROL Take 10 mg by  mouth 2 (two) times daily.   HYDROcodone-acetaminophen 5-325 MG tablet Commonly known as:  NORCO/VICODIN Take 1 tablet by mouth every 4 (four) hours as needed for moderate pain. Replaces:  HYDROcodone-acetaminophen 7.5-325 MG tablet   losartan 100 MG tablet Commonly known as:  COZAAR Take 100 mg by mouth every evening.   Magnesium 500 MG Tabs Take 1,000 mg by mouth daily.   meloxicam 7.5 MG tablet Commonly known as:  MOBIC Take 7.5 mg by mouth every evening.   metFORMIN 1000 MG tablet Commonly known as:  GLUCOPHAGE Take 1,000 mg by mouth 2 (two) times daily.   methocarbamol 500 MG tablet Commonly known as:  ROBAXIN Take 1 tablet (500 mg total) by mouth every 6 (six) hours as needed for muscle spasms.   metoprolol succinate 50 MG 24 hr tablet Commonly known as:  TOPROL-XL Take 1 tablet (50 mg total) by mouth 2 (two) times daily.  multivitamin with minerals tablet Take 1 tablet by mouth every evening.   omeprazole 20 MG capsule Commonly known as:  PRILOSEC Take 20 mg by mouth daily as needed (for acid reflux).   potassium chloride 10 MEQ tablet Commonly known as:  K-DUR,KLOR-CON Take 10 mEq by mouth daily.   rosuvastatin 10 MG tablet Commonly known as:  CRESTOR Take 10 mg by mouth every evening.   SUPER B COMPLEX PO Take 1 tablet by mouth every evening.   topiramate 25 MG tablet Commonly known as:  TOPAMAX Take 25 mg by mouth daily.   TOUJEO SOLOSTAR 300 UNIT/ML Sopn Generic drug:  Insulin Glargine Inject 60 Units into the skin daily.       Disposition: home   Final Dx: LL L1-2  Discharge Instructions     Remove dressing in 72 hours   Complete by:  As directed    Call MD for:  difficulty breathing, headache or visual disturbances   Complete by:  As directed    Call MD for:  hives   Complete by:  As directed    Call MD for:  persistant dizziness or light-headedness   Complete by:  As directed    Call MD for:  persistant nausea and vomiting    Complete by:  As directed    Call MD for:  redness, tenderness, or signs of infection (pain, swelling, redness, odor or green/yellow discharge around incision site)   Complete by:  As directed    Call MD for:  severe uncontrolled pain   Complete by:  As directed    Call MD for:  temperature >100.4   Complete by:  As directed    Diet - low sodium heart healthy   Complete by:  As directed    Driving Restrictions   Complete by:  As directed    No driving for 2 weeks, no riding in the car for 1 week   Increase activity slowly   Complete by:  As directed    Lifting restrictions   Complete by:  As directed    No lifting more than 8 lbs         Signed: Wilsie Kern S 03/04/2018, 7:41 AM

## 2018-03-16 DIAGNOSIS — M1712 Unilateral primary osteoarthritis, left knee: Secondary | ICD-10-CM | POA: Diagnosis not present

## 2018-03-21 ENCOUNTER — Telehealth: Payer: Self-pay | Admitting: *Deleted

## 2018-03-21 NOTE — Telephone Encounter (Signed)
   Primary Cardiologist: [name], MD  Chart reviewed as part of pre-operative protocol coverage. The patient was recently cleared by D. Skians for back surgery. No complications. She is restarting her water aerobic exercise 3 times/week on 04/10/18.  Given past medical history and time since last visit, based on ACC/AHA guidelines, [name] would be at acceptable risk for the planned procedure without further cardiovascular testing.   [name], can patient hold ASA for 5-7 days for knee surgery? Please forward your response to [facility] PREOP.   Thank you  [name], PA 03/21/2018, 4:00 PM

## 2018-03-21 NOTE — Telephone Encounter (Signed)
   [facility] Pre-operative Risk Assessment    Request for surgical clearance:  1. What type of surgery is being performed? LEFT TOTAL KNEE   2. When is this surgery scheduled? 06/13/18   3. Are there any medications that need to be held prior to surgery and how long? ASA   4. Practice name and name of physician performing surgery? [facility]; DR. Maureen Ralphs   5. What is your office phone and fax number? PH# [phone]; FAX# [phone]   6. Anesthesia type (None, local, MAC, general) ? CHOICE   [name] 03/21/2018, 3:50 PM  _________________________________________________________________   (provider comments below)

## 2018-03-22 ENCOUNTER — Ambulatory Visit: Payer: PPO | Admitting: Orthotics

## 2018-03-22 DIAGNOSIS — M2042 Other hammer toe(s) (acquired), left foot: Secondary | ICD-10-CM | POA: Diagnosis not present

## 2018-03-22 DIAGNOSIS — E1140 Type 2 diabetes mellitus with diabetic neuropathy, unspecified: Secondary | ICD-10-CM | POA: Diagnosis not present

## 2018-03-22 DIAGNOSIS — M2041 Other hammer toe(s) (acquired), right foot: Secondary | ICD-10-CM | POA: Diagnosis not present

## 2018-03-22 NOTE — Telephone Encounter (Signed)
Yes she may hold aspirin for 5 days prior to knee surgery. [name], MD

## 2018-03-23 NOTE — Telephone Encounter (Signed)
Routing to requesting provider clearance.  Burtis Junes, RN, [place] [address] [place], [place]  [id number] [phone]

## 2018-03-30 DIAGNOSIS — I10 Essential (primary) hypertension: Secondary | ICD-10-CM | POA: Diagnosis not present

## 2018-03-30 DIAGNOSIS — M549 Dorsalgia, unspecified: Secondary | ICD-10-CM | POA: Diagnosis not present

## 2018-03-30 DIAGNOSIS — E1140 Type 2 diabetes mellitus with diabetic neuropathy, unspecified: Secondary | ICD-10-CM | POA: Diagnosis not present

## 2018-03-30 DIAGNOSIS — M179 Osteoarthritis of knee, unspecified: Secondary | ICD-10-CM | POA: Diagnosis not present

## 2018-03-30 DIAGNOSIS — J449 Chronic obstructive pulmonary disease, unspecified: Secondary | ICD-10-CM | POA: Diagnosis not present

## 2018-03-30 DIAGNOSIS — E782 Mixed hyperlipidemia: Secondary | ICD-10-CM | POA: Diagnosis not present

## 2018-03-30 DIAGNOSIS — J42 Unspecified chronic bronchitis: Secondary | ICD-10-CM | POA: Diagnosis not present

## 2018-03-30 DIAGNOSIS — D509 Iron deficiency anemia, unspecified: Secondary | ICD-10-CM | POA: Diagnosis not present

## 2018-03-31 DIAGNOSIS — M19011 Primary osteoarthritis, right shoulder: Secondary | ICD-10-CM | POA: Diagnosis not present

## 2018-04-06 DIAGNOSIS — J449 Chronic obstructive pulmonary disease, unspecified: Secondary | ICD-10-CM | POA: Diagnosis not present

## 2018-04-06 DIAGNOSIS — I10 Essential (primary) hypertension: Secondary | ICD-10-CM | POA: Diagnosis not present

## 2018-04-06 DIAGNOSIS — E782 Mixed hyperlipidemia: Secondary | ICD-10-CM | POA: Diagnosis not present

## 2018-04-06 DIAGNOSIS — D509 Iron deficiency anemia, unspecified: Secondary | ICD-10-CM | POA: Diagnosis not present

## 2018-04-06 DIAGNOSIS — E1140 Type 2 diabetes mellitus with diabetic neuropathy, unspecified: Secondary | ICD-10-CM | POA: Diagnosis not present

## 2018-04-06 DIAGNOSIS — M179 Osteoarthritis of knee, unspecified: Secondary | ICD-10-CM | POA: Diagnosis not present

## 2018-04-18 DIAGNOSIS — M62830 Muscle spasm of back: Secondary | ICD-10-CM | POA: Diagnosis not present

## 2018-04-18 DIAGNOSIS — M5441 Lumbago with sciatica, right side: Secondary | ICD-10-CM | POA: Diagnosis not present

## 2018-04-18 DIAGNOSIS — M9903 Segmental and somatic dysfunction of lumbar region: Secondary | ICD-10-CM | POA: Diagnosis not present

## 2018-04-18 DIAGNOSIS — M5442 Lumbago with sciatica, left side: Secondary | ICD-10-CM | POA: Diagnosis not present

## 2018-04-21 DIAGNOSIS — M5442 Lumbago with sciatica, left side: Secondary | ICD-10-CM | POA: Diagnosis not present

## 2018-04-21 DIAGNOSIS — M5441 Lumbago with sciatica, right side: Secondary | ICD-10-CM | POA: Diagnosis not present

## 2018-04-21 DIAGNOSIS — M62830 Muscle spasm of back: Secondary | ICD-10-CM | POA: Diagnosis not present

## 2018-04-21 DIAGNOSIS — M9903 Segmental and somatic dysfunction of lumbar region: Secondary | ICD-10-CM | POA: Diagnosis not present

## 2018-04-25 DIAGNOSIS — M545 Low back pain: Secondary | ICD-10-CM | POA: Diagnosis not present

## 2018-04-26 DIAGNOSIS — M9903 Segmental and somatic dysfunction of lumbar region: Secondary | ICD-10-CM | POA: Diagnosis not present

## 2018-04-26 DIAGNOSIS — M5442 Lumbago with sciatica, left side: Secondary | ICD-10-CM | POA: Diagnosis not present

## 2018-04-26 DIAGNOSIS — M62830 Muscle spasm of back: Secondary | ICD-10-CM | POA: Diagnosis not present

## 2018-04-26 DIAGNOSIS — M5441 Lumbago with sciatica, right side: Secondary | ICD-10-CM | POA: Diagnosis not present

## 2018-05-03 DIAGNOSIS — I10 Essential (primary) hypertension: Secondary | ICD-10-CM | POA: Diagnosis not present

## 2018-05-03 DIAGNOSIS — E782 Mixed hyperlipidemia: Secondary | ICD-10-CM | POA: Diagnosis not present

## 2018-05-03 DIAGNOSIS — J449 Chronic obstructive pulmonary disease, unspecified: Secondary | ICD-10-CM | POA: Diagnosis not present

## 2018-05-03 DIAGNOSIS — M179 Osteoarthritis of knee, unspecified: Secondary | ICD-10-CM | POA: Diagnosis not present

## 2018-05-03 DIAGNOSIS — D509 Iron deficiency anemia, unspecified: Secondary | ICD-10-CM | POA: Diagnosis not present

## 2018-05-03 DIAGNOSIS — E1140 Type 2 diabetes mellitus with diabetic neuropathy, unspecified: Secondary | ICD-10-CM | POA: Diagnosis not present

## 2018-05-25 DIAGNOSIS — M533 Sacrococcygeal disorders, not elsewhere classified: Secondary | ICD-10-CM | POA: Diagnosis not present

## 2018-06-01 NOTE — H&P (Signed)
TOTAL KNEE ADMISSION H&P  Patient is being admitted for left total knee arthroplasty.  Subjective:  Chief Complaint:left knee pain.  HPI: [name], 78 y.o. female, has a history of pain and functional disability in the left knee due to arthritis and has failed non-surgical conservative treatments for greater than 12 weeks to includecorticosteriod injections, viscosupplementation injections and activity modification.  Onset of symptoms was gradual, starting several years ago with gradually worsening course since that time. The patient noted no past surgery on the left knee(s).  Patient currently rates pain in the left knee(s) at 10 out of 10 with activity. Patient has worsening of pain with activity and weight bearing, crepitus, joint swelling and instability.  Patient has evidence of lateral and patellofemoral bone-on-bone arthritis with slight valgus deformity by imaging studies. There is no active infection.  Patient Active Problem List   Diagnosis Date Noted  . S/P lumbar laminectomy 03/03/2018  . S/P lumbar spinal fusion 10/09/2016  . Dyspnea 01/19/2014  . Pure hypercholesterolemia 01/19/2014  . Essential hypertension, benign 01/19/2014  . Obesity, unspecified 01/19/2014   Past Medical History:  Diagnosis Date  . Anemia   . Arthritis   . Battery end of life of spinal cord stimulator 2016   battery replaced.   . Diabetes mellitus   . DVT (deep venous thrombosis) ([place])   . Dyspnea    on exertion  . GERD (gastroesophageal reflux disease)   . History of kidney stones   . History of stomach ulcers   . Hypertension    dr Marlou Porch  . Neuropathy   . Rapid heartbeat   . Sleep apnea    borderline study; could use a CPAP if desired.     Past Surgical History:  Procedure Laterality Date  . ABDOMINAL HYSTERECTOMY  1979 & 1981   ovaries and uterus removed at different times.  . APPENDECTOMY    . ARTHOSCOPIC ROTAOR CUFF REPAIR Left 2010 or 2011  . BACK SURGERY  x 2 1999 and  2008   lower back L 4 to L 5 L5 to S 1  . BREAST BIOPSY Bilateral 05/18/1997  . CARPAL TUNNEL RELEASE Right 2014  . CARPAL TUNNEL RELEASE Bilateral   . COLONOSCOPY WITH PROPOFOL N/A 10/25/2012   Procedure: COLONOSCOPY WITH PROPOFOL;  Surgeon: [name], MD;  Location: [facility];  Service: Endoscopy;  Laterality: N/A;  . EYE SURGERY     eyelid surgery  . JOINT REPLACEMENT Right 2012   knee  . KNEE ARTHROSCOPY Right 2004  . LUMBAR LAMINECTOMY/DECOMPRESSION MICRODISCECTOMY Bilateral 03/03/2018   Procedure: Laminectomy and Foraminotomy - Lumbar one-Lumbar two - bilateral Posterior lateral fusion;  Surgeon: [name], MD;  Location: [facility];  Service: Neurosurgery;  Laterality: Bilateral;  . ovarian wedge resection  1966  . SPINAL CORD STIMULATOR INSERTION N/A 11/03/2012   Procedure: LUMBAR SPINAL CORD STIMULATOR INSERTION;  Surgeon: [name], MD;  Location: [facility];  Service: Orthopedics;  Laterality: N/A;  . SPINAL FUSION  10/2016  . TONSILLECTOMY  1960    No current facility-administered medications for this encounter.    Current Outpatient Medications  Medication Sig Dispense Refill Last Dose  . albuterol (PROVENTIL HFA;VENTOLIN HFA) 108 (90 Base) MCG/ACT inhaler Inhale 1 puff into the lungs every 6 (six) hours as needed for wheezing or shortness of breath.   Past Week at Unknown time  . aspirin EC 81 MG tablet Take 81 mg by mouth daily.   Past Week at Unknown time  .  B Complex-C (SUPER B COMPLEX PO) Take 1 tablet by mouth every evening.    Past Week at Unknown time  . Biotin 5000 MCG TABS Take 5,000 mg by mouth every evening.    Past Week at Unknown time  . Calcium-Magnesium-Vitamin D (CALCIUM 1200+D3 PO) Take 1 tablet by mouth every evening.   Past Week at Unknown time  . Co-Enzyme Q10 200 MG CAPS Take 400 mg by mouth every evening.    Past Week at Unknown time  . diclofenac sodium (VOLTAREN) 1 % GEL Apply 2 g topically daily as needed (joint pain).     Marland Kitchen estradiol  (ESTRACE) 1 MG tablet Take 1 mg by mouth every evening.    03/02/2018 at Unknown time  . fexofenadine (ALLEGRA) 180 MG tablet Take 180 mg by mouth daily as needed for allergies or rhinitis.   More than a month at Unknown time  . fluticasone furoate-vilanterol (BREO ELLIPTA) 100-25 MCG/INH AEPB Inhale 2 puffs into the lungs daily.    Past Week at Unknown time  . furosemide (LASIX) 20 MG tablet Take 20 mg by mouth daily as needed for fluid.   3 More than a month at Unknown time  . glipiZIDE (GLUCOTROL) 10 MG tablet Take 10 mg by mouth 2 (two) times daily.   03/02/2018 at Unknown time  . HYDROcodone-acetaminophen (NORCO/VICODIN) 5-325 MG tablet Take 1 tablet by mouth every 4 (four) hours as needed for moderate pain. (Patient taking differently: Take 1 tablet by mouth 3 (three) times daily as needed for moderate pain. ) 20 tablet 0   . Insulin Glargine (TOUJEO SOLOSTAR) 300 UNIT/ML SOPN Inject 60 Units into the skin daily.    03/03/2018 at 0415  . losartan (COZAAR) 100 MG tablet Take 100 mg by mouth every evening.    03/02/2018 at Unknown time  . Magnesium 500 MG TABS Take 1,000 mg by mouth daily.    Past Week at Unknown time  . meloxicam (MOBIC) 7.5 MG tablet Take 7.5 mg by mouth every evening.    Past Week at Unknown time  . metFORMIN (GLUCOPHAGE) 1000 MG tablet Take 1,000 mg by mouth 2 (two) times daily.    03/02/2018 at Unknown time  . metoprolol succinate (TOPROL-XL) 50 MG 24 hr tablet Take 1 tablet (50 mg total) by mouth 2 (two) times daily. 180 tablet 3 03/03/2018 at 0415  . Multiple Vitamins-Minerals (MULTIVITAMIN WITH MINERALS) tablet Take 1 tablet by mouth every evening.    Past Week at Unknown time  . Omega-3 Fatty Acids (FISH OIL) 1200 MG CAPS Take 2,400 mg by mouth 2 (two) times daily.    Past Week at Unknown time  . omeprazole (PRILOSEC) 20 MG capsule Take 20 mg by mouth daily as needed (for acid reflux).   03/02/2018 at Unknown time  . potassium chloride (K-DUR,KLOR-CON) 10 MEQ tablet Take  10 mEq by mouth daily.    03/02/2018 at Unknown time  . rosuvastatin (CRESTOR) 10 MG tablet Take 10 mg by mouth every evening.    03/02/2018 at Unknown time  . methocarbamol (ROBAXIN) 500 MG tablet Take 1 tablet (500 mg total) by mouth every 6 (six) hours as needed for muscle spasms. (Patient not taking: Reported on 05/25/2018) 30 tablet 0 Not Taking at Unknown time   Allergies  Allergen Reactions  . Hydrochlorothiazide Anaphylaxis and Other (See Comments)    comatose  . Ace Inhibitors Cough  . Ezetimibe-Simvastatin Other (See Comments)    MYALGIA  . Indomethacin Other (See  Comments)    PEPTIC ULCERS  . Lisinopril Cough  . Lyrica [Pregabalin] Other (See Comments)    Weight gain, drowsy  . Prednisone Itching, Swelling and Other (See Comments)    Facial redness/swelling   . Simvastatin Other (See Comments)    Elevated LFT's  . Vioxx [Rofecoxib] Other (See Comments)    Anxiety & chills  . Troglitazone Other (See Comments)    Pt is unaware of reaction.  Marland Kitchen Antihistamines, Chlorpheniramine-Type Other (See Comments)    "HYPER"    Social History   Tobacco Use  . Smoking status: Never Smoker  . Smokeless tobacco: Never Used  Substance Use Topics  . Alcohol use: No    Family History  Problem Relation Age of Onset  . Heart disease Father   . Diabetes Mother      Review of Systems  Constitutional: Negative for chills and fever.  HENT: Negative for congestion, sore throat and tinnitus.   Eyes: Negative for double vision, photophobia and pain.  Respiratory: Negative for cough, shortness of breath and wheezing.   Cardiovascular: Negative for chest pain, palpitations and orthopnea.  Gastrointestinal: Negative for heartburn, nausea and vomiting.  Genitourinary: Negative for dysuria, frequency and urgency.  Musculoskeletal: Positive for joint pain.  Neurological: Negative for dizziness, weakness and headaches.    Objective:  Physical Exam  Well nourished and well developed.    General: Alert and oriented x3, cooperative and pleasant, no acute distress.  Head: normocephalic, atraumatic, neck supple.  Eyes: EOMI.  Respiratory: breath sounds clear in all fields, no wheezing, rales, or rhonchi. Cardiovascular: Regular rate and rhythm, no murmurs, gallops or rubs.  Abdomen: non-tender to palpation and soft, normoactive bowel sounds. Musculoskeletal:  Left Knee Exam: No effusion. Slight valgus deformity. Range of motion is 5-125 degrees. Moderate crepitus on range of motion of the knee. Positive lateral, greater than medial, joint line tenderness.  Stable knee.  Calves soft and nontender. Motor function intact in LE. Strength 5/5 LE bilaterally. Neuro: Distal pulses 2+. Sensation to light touch intact in LE.  Vital signs in last 24 hours: Blood pressure: 132/74 mmHg Pulse: 76 bpm  Labs:   Estimated body mass index is 33.46 kg/m as calculated from the following:   Height as of 03/03/18: 5\' 5"  (1.651 m).   Weight as of 03/03/18: 91.2 kg.   Imaging Review Plain radiographs demonstrate severe degenerative joint disease of the left knee(s). The overall alignment ismild valgus. The bone quality appears to be adequate for age and reported activity level.   Preoperative templating of the joint replacement has been completed, documented, and submitted to the Operating Room personnel in order to optimize intra-operative equipment management.   Anticipated LOS equal to or greater than 2 midnights due to - Age 11 and older with one or more of the following:  - Obesity  - Expected need for hospital services (PT, OT, Nursing) required for safe  discharge  - Anticipated need for postoperative skilled nursing care or inpatient rehab  - Active co-morbidities: Diabetes and DVT/VTE OR   - Unanticipated findings during/Post Surgery: None  - Patient is a high risk of re-admission due to: None     Assessment/Plan:  End stage arthritis, left knee   The patient  history, physical examination, clinical judgment of the provider and imaging studies are consistent with end stage degenerative joint disease of the left knee(s) and total knee arthroplasty is deemed medically necessary. The treatment options including medical management, injection therapy arthroscopy and arthroplasty  were discussed at length. The risks and benefits of total knee arthroplasty were presented and reviewed. The risks due to aseptic loosening, infection, stiffness, patella tracking problems, thromboembolic complications and other imponderables were discussed. The patient acknowledged the explanation, agreed to proceed with the plan and consent was signed. Patient is being admitted for inpatient treatment for surgery, pain control, PT, OT, prophylactic antibiotics, VTE prophylaxis, progressive ambulation and ADL's and discharge planning. The patient is planning to be discharged home.   Therapy Plans: outpatient therapy at [facility] Disposition: Home with husband Planned DVT Prophylaxis: Xarelto 10 mg daily (hx of DVT) DME needed: None PCP: [name], MD Cardiologist: [name], MD TXA: Topical (hx of DVT) Allergies: HCTZ Anesthesia Concerns: None BMI: 34.7 Last HgbA1c: 6.9% Other: Post-operative pain management will be complicated by chronic opioid use.  - Patient was instructed on what medications to stop prior to surgery. - Follow-up visit in 2 weeks with Dr. Wynelle Link - Begin physical therapy following surgery - Pre-operative lab work as pre-surgical testing - Prescriptions will be provided in hospital at time of discharge  Theresa Duty, PA-C Orthopedic Surgery [facility] [place]

## 2018-06-03 NOTE — Patient Instructions (Addendum)
[name]  06/03/2018   Your procedure is scheduled on: 06-13-18    Report to [facility] Main  Entrance    Report to Admitting at 11:15 AM    Call this number if you have problems the morning of surgery [phone]    Remember: Do not eat food or drink liquids :After Midnight. You may have a Clear Liquid Diet from Midnight until 7:45 AM. After 7:45 AM, nothing until after surgery.     CLEAR LIQUID DIET   Foods Allowed                                                                     Foods Excluded  Coffee and tea, regular and decaf                             liquids that you cannot  Plain Jell-O in any flavor                                             see through such as: Fruit ices (not with fruit pulp)                                     milk, soups, orange juice  Iced Popsicles                                    All solid food Carbonated beverages, regular and diet                                    Cranberry, grape and apple juices Sports drinks like Gatorade Lightly seasoned clear broth or consume(fat free) Sugar, honey syrup  Sample Menu Breakfast                                Lunch                                     Supper Cranberry juice                    Beef broth                            Chicken broth Jell-O                                     Grape juice  Apple juice Coffee or tea                        Jell-O                                      Popsicle                                                Coffee or tea                        Coffee or tea  _____________________________________________________________________     Take these medicines the morning of surgery with A SIP OF WATER: Metoprolol Succinate, and Fexofenadine (Allegra) if needed.    BRUSH YOUR TEETH MORNING OF SURGERY AND RINSE YOUR MOUTH OUT, NO CHEWING GUM CANDY OR MINTS.   DO NOT TAKE ANY DIABETIC MEDICATIONS DAY OF YOUR  SURGERY                               You may not have any metal on your body including hair pins and              piercings  Do not wear jewelry, make-up, lotions, powders or perfumes, deodorant             Do not wear nail polish.  Do not shave  48 hours prior to surgery.                 Do not bring valuables to the hospital. [place].  Contacts, dentures or bridgework may not be worn into surgery.  Leave suitcase in the car. After surgery it may be brought to your room.     Patients discharged the day of surgery will not be allowed to drive home. IF YOU ARE HAVING SURGERY AND GOING HOME THE SAME DAY, YOU MUST HAVE AN ADULT TO DRIVE YOU HOME AND BE WITH YOU FOR 24 HOURS. YOU MAY GO HOME BY TAXI OR UBER OR ORTHERWISE, BUT AN ADULT MUST ACCOMPANY YOU HOME AND STAY WITH YOU FOR 24 HOURS.   Special Instructions: N/A              Please read over the following fact sheets you were given: _____________________________________________________________________    How to Manage Your Diabetes Before and After Surgery  Why is it important to control my blood sugar before and after surgery? . Improving blood sugar levels before and after surgery helps healing and can limit problems. . A way of improving blood sugar control is eating a healthy diet by: o  Eating less sugar and carbohydrates o  Increasing activity/exercise o  Talking with your doctor about reaching your blood sugar goals . High blood sugars (greater than 180 mg/dL) can raise your risk of infections and slow your recovery, so you will need to focus on controlling your diabetes during the weeks before surgery. . Make sure that the doctor who takes care of your diabetes knows about your planned surgery including the date and location.  How do I  manage my blood sugar before surgery? . Check your blood sugar at least 4 times a day, starting 2 days before surgery, to make sure that  the level is not too high or low. o Check your blood sugar the morning of your surgery when you wake up and every 2 hours until you get to the Short Stay unit. . If your blood sugar is less than 70 mg/dL, you will need to treat for low blood sugar: o Do not take insulin. o Treat a low blood sugar (less than 70 mg/dL) with  cup of clear juice (cranberry or apple), 4 glucose tablets, OR glucose gel. o Recheck blood sugar in 15 minutes after treatment (to make sure it is greater than 70 mg/dL). If your blood sugar is not greater than 70 mg/dL on recheck, call [phone] for further instructions. . Report your blood sugar to the short stay nurse when you get to Short Stay.  . If you are admitted to the hospital after surgery: o Your blood sugar will be checked by the staff and you will probably be given insulin after surgery (instead of oral diabetes medicines) to make sure you have good blood sugar levels. o The goal for blood sugar control after surgery is 80-180 mg/dL.   WHAT DO I DO ABOUT MY DIABETES MEDICATION?  Marland Kitchen Do not take oral diabetes medicines (pills) the morning of surgery.  . THE DAY BEFORE SURGERY, take only your morning/lunch dose of Glipizide. Take your usual dose of Metformin. You may also take your usual 60 units of Tujeo insulin in the morning.       Reviewed and Endorsed by [facility] Patient Education Committee, August 2015   [facility] - Preparing for Surgery Before surgery, you can play an important role.  Because skin is not sterile, your skin needs to be as free of germs as possible.  You can reduce the number of germs on your skin by washing with CHG (chlorahexidine gluconate) soap before surgery.  CHG is an antiseptic cleaner which kills germs and bonds with the skin to continue killing germs even after washing. Please DO NOT use if you have an allergy to CHG or antibacterial soaps.  If your skin becomes reddened/irritated stop using the CHG and inform your nurse  when you arrive at Short Stay. Do not shave (including legs and underarms) for at least 48 hours prior to the first CHG shower.  You may shave your face/neck. Please follow these instructions carefully:  1.  Shower with CHG Soap the night before surgery and the  morning of Surgery.  2.  If you choose to wash your hair, wash your hair first as usual with your  normal  shampoo.  3.  After you shampoo, rinse your hair and body thoroughly to remove the  shampoo.                           4.  Use CHG as you would any other liquid soap.  You can apply chg directly  to the skin and wash                       Gently with a scrungie or clean washcloth.  5.  Apply the CHG Soap to your body ONLY FROM THE NECK DOWN.   Do not use on face/ open  Wound or open sores. Avoid contact with eyes, ears mouth and genitals (private parts).                       Wash face,  Genitals (private parts) with your normal soap.             6.  Wash thoroughly, paying special attention to the area where your surgery  will be performed.  7.  Thoroughly rinse your body with warm water from the neck down.  8.  DO NOT shower/wash with your normal soap after using and rinsing off  the CHG Soap.                9.  Pat yourself dry with a clean towel.            10.  Wear clean pajamas.            11.  Place clean sheets on your bed the night of your first shower and do not  sleep with pets. Day of Surgery : Do not apply any lotions/deodorants the morning of surgery.  Please wear clean clothes to the hospital/surgery center.  FAILURE TO FOLLOW THESE INSTRUCTIONS MAY RESULT IN THE CANCELLATION OF YOUR SURGERY PATIENT SIGNATURE_________________________________  NURSE SIGNATURE__________________________________  ________________________________________________________________________   Adam Phenix  An incentive spirometer is a tool that can help keep your lungs clear and active. This tool  measures how well you are filling your lungs with each breath. Taking long deep breaths may help reverse or decrease the chance of developing breathing (pulmonary) problems (especially infection) following:  A long period of time when you are unable to move or be active. BEFORE THE PROCEDURE   If the spirometer includes an indicator to show your best effort, your nurse or respiratory therapist will set it to a desired goal.  If possible, sit up straight or lean slightly forward. Try not to slouch.  Hold the incentive spirometer in an upright position. INSTRUCTIONS FOR USE  1. Sit on the edge of your bed if possible, or sit up as far as you can in bed or on a chair. 2. Hold the incentive spirometer in an upright position. 3. Breathe out normally. 4. Place the mouthpiece in your mouth and seal your lips tightly around it. 5. Breathe in slowly and as deeply as possible, raising the piston or the ball toward the top of the column. 6. Hold your breath for 3-5 seconds or for as long as possible. Allow the piston or ball to fall to the bottom of the column. 7. Remove the mouthpiece from your mouth and breathe out normally. 8. Rest for a few seconds and repeat Steps 1 through 7 at least 10 times every 1-2 hours when you are awake. Take your time and take a few normal breaths between deep breaths. 9. The spirometer may include an indicator to show your best effort. Use the indicator as a goal to work toward during each repetition. 10. After each set of 10 deep breaths, practice coughing to be sure your lungs are clear. If you have an incision (the cut made at the time of surgery), support your incision when coughing by placing a pillow or rolled up towels firmly against it. Once you are able to get out of bed, walk around indoors and cough well. You may stop using the incentive spirometer when instructed by your caregiver.  RISKS AND COMPLICATIONS  Take your time so you do not get  dizzy or  light-headed.  If you are in pain, you may need to take or ask for pain medication before doing incentive spirometry. It is harder to take a deep breath if you are having pain. AFTER USE  Rest and breathe slowly and easily.  It can be helpful to keep track of a log of your progress. Your caregiver can provide you with a simple table to help with this. If you are using the spirometer at home, follow these instructions: Short IF:   You are having difficultly using the spirometer.  You have trouble using the spirometer as often as instructed.  Your pain medication is not giving enough relief while using the spirometer.  You develop fever of 100.5 F (38.1 C) or higher. SEEK IMMEDIATE MEDICAL CARE IF:   You cough up bloody sputum that had not been present before.  You develop fever of 102 F (38.9 C) or greater.  You develop worsening pain at or near the incision site. MAKE SURE YOU:   Understand these instructions.  Will watch your condition.  Will get help right away if you are not doing well or get worse. Document Released: 09/07/2006 Document Revised: 07/20/2011 Document Reviewed: 11/08/2006 Exit[employer] Patient Information 2014 Exit[employer], [place].   ________________________________________________________________________  WHAT IS A BLOOD TRANSFUSION? Blood Transfusion Information  A transfusion is the replacement of blood or some of its parts. Blood is made up of multiple cells which provide different functions.  Red blood cells carry oxygen and are used for blood loss replacement.  White blood cells fight against infection.  Platelets control bleeding.  Plasma helps clot blood.  Other blood products are available for specialized needs, such as hemophilia or other clotting disorders. BEFORE THE TRANSFUSION  Who gives blood for transfusions?   Healthy volunteers who are fully evaluated to make sure their blood is safe. This is blood bank  blood. Transfusion therapy is the safest it has ever been in the practice of medicine. Before blood is taken from a donor, a complete history is taken to make sure that person has no history of diseases nor engages in risky social behavior (examples are intravenous drug use or sexual activity with multiple partners). The donor's travel history is screened to minimize risk of transmitting infections, such as malaria. The donated blood is tested for signs of infectious diseases, such as HIV and hepatitis. The blood is then tested to be sure it is compatible with you in order to minimize the chance of a transfusion reaction. If you or a relative donates blood, this is often done in anticipation of surgery and is not appropriate for emergency situations. It takes many days to process the donated blood. RISKS AND COMPLICATIONS Although transfusion therapy is very safe and saves many lives, the main dangers of transfusion include:   Getting an infectious disease.  Developing a transfusion reaction. This is an allergic reaction to something in the blood you were given. Every precaution is taken to prevent this. The decision to have a blood transfusion has been considered carefully by your caregiver before blood is given. Blood is not given unless the benefits outweigh the risks. AFTER THE TRANSFUSION  Right after receiving a blood transfusion, you will usually feel much better and more energetic. This is especially true if your red blood cells have gotten low (anemic). The transfusion raises the level of the red blood cells which carry oxygen, and this usually causes an energy increase.  The nurse administering the transfusion will  monitor you carefully for complications. HOME CARE INSTRUCTIONS  No special instructions are needed after a transfusion. You may find your energy is better. Speak with your caregiver about any limitations on activity for underlying diseases you may have. SEEK MEDICAL CARE IF:    Your condition is not improving after your transfusion.  You develop redness or irritation at the intravenous (IV) site. SEEK IMMEDIATE MEDICAL CARE IF:  Any of the following symptoms occur over the next 12 hours:  Shaking chills.  You have a temperature by mouth above 102 F (38.9 C), not controlled by medicine.  Chest, back, or muscle pain.  People around you feel you are not acting correctly or are confused.  Shortness of breath or difficulty breathing.  Dizziness and fainting.  You get a rash or develop hives.  You have a decrease in urine output.  Your urine turns a dark color or changes to pink, red, or brown. Any of the following symptoms occur over the next 10 days:  You have a temperature by mouth above 102 F (38.9 C), not controlled by medicine.  Shortness of breath.  Weakness after normal activity.  The white part of the eye turns yellow (jaundice).  You have a decrease in the amount of urine or are urinating less often.  Your urine turns a dark color or changes to pink, red, or brown. Document Released: 04/24/2000 Document Revised: 07/20/2011 Document Reviewed: 12/12/2007 [facility] Patient Information 2014 [place], [place].  _______________________________________________________________________

## 2018-06-03 NOTE — Progress Notes (Signed)
03-21-18 (Epic) Cardiac Clearance from Dr. Gillian Shields  03-30-18 [facility]) Surgical clearance from Dr. Inda Merlin on chart  02-23-18 (Epic) CXR  01-28-18 (Epic) EKG

## 2018-06-06 ENCOUNTER — Encounter (HOSPITAL_COMMUNITY)
Admission: RE | Admit: 2018-06-06 | Discharge: 2018-06-06 | Disposition: A | Discharge status: Home / Self Care | Payer: PPO | Source: Ambulatory Visit | Attending: Orthopedic Surgery | Admitting: Orthopedic Surgery

## 2018-06-06 ENCOUNTER — Encounter (HOSPITAL_COMMUNITY): Payer: Self-pay

## 2018-06-06 ENCOUNTER — Other Ambulatory Visit: Payer: Self-pay

## 2018-06-06 DIAGNOSIS — M1712 Unilateral primary osteoarthritis, left knee: Secondary | ICD-10-CM | POA: Insufficient documentation

## 2018-06-06 DIAGNOSIS — Z01812 Encounter for preprocedural laboratory examination: Secondary | ICD-10-CM | POA: Diagnosis not present

## 2018-06-06 HISTORY — DX: Chronic obstructive pulmonary disease, unspecified: J44.9

## 2018-06-06 LAB — COMPREHENSIVE METABOLIC PANEL
ALT: 15 U/L (ref 0–44)
AST: 18 U/L (ref 15–41)
Albumin: 3.9 g/dL (ref 3.5–5.0)
Alkaline Phosphatase: 61 U/L (ref 38–126)
Anion gap: 6 (ref 5–15)
BUN: 39 mg/dL — ABNORMAL HIGH (ref 8–23)
CO2: 25 mmol/L (ref 22–32)
Calcium: 9.4 mg/dL (ref 8.9–10.3)
Chloride: 109 mmol/L (ref 98–111)
Creatinine, Ser: 1.08 mg/dL — ABNORMAL HIGH (ref 0.44–1.00)
GFR calc Af Amer: 58 mL/min — ABNORMAL LOW (ref >60)
GFR calc non Af Amer: 50 mL/min — ABNORMAL LOW (ref >60)
Glucose, Bld: 144 mg/dL — ABNORMAL HIGH (ref 70–99)
Potassium: 4.6 mmol/L (ref 3.5–5.1)
Sodium: 140 mmol/L (ref 135–145)
Total Bilirubin: 0.8 mg/dL (ref 0.3–1.2)
Total Protein: 7.1 g/dL (ref 6.5–8.1)

## 2018-06-06 LAB — CBC
HCT: 41.1 % (ref 36.0–46.0)
Hemoglobin: 12.7 g/dL (ref 12.0–15.0)
MCH: 26 pg (ref 26.0–34.0)
MCHC: 30.9 g/dL (ref 30.0–36.0)
MCV: 84 fL (ref 80.0–100.0)
Platelets: 275 10*3/uL (ref 150–400)
RBC: 4.89 MIL/uL (ref 3.87–5.11)
RDW: 13.9 % (ref 11.5–15.5)
WBC: 10.8 10*3/uL — ABNORMAL HIGH (ref 4.0–10.5)
nRBC: 0 % (ref 0.0–0.2)

## 2018-06-06 LAB — HEMOGLOBIN A1C
Hgb A1c MFr Bld: 7.2 % — ABNORMAL HIGH (ref 4.8–5.6)
Mean Plasma Glucose: 159.94 mg/dL

## 2018-06-06 LAB — GLUCOSE, CAPILLARY: Glucose-Capillary: 171 mg/dL — ABNORMAL HIGH (ref 70–99)

## 2018-06-06 LAB — SURGICAL PCR SCREEN
MRSA, PCR: NEGATIVE
Staphylococcus aureus: NEGATIVE

## 2018-06-06 LAB — APTT: aPTT: 26 seconds (ref 24–36)

## 2018-06-06 LAB — PROTIME-INR
INR: 0.97
Prothrombin Time: 12.8 seconds (ref 11.4–15.2)

## 2018-06-06 NOTE — Progress Notes (Signed)
06-06-18 CMP result routed to Dr. Anne Fu office

## 2018-06-08 DIAGNOSIS — E782 Mixed hyperlipidemia: Secondary | ICD-10-CM | POA: Diagnosis not present

## 2018-06-08 DIAGNOSIS — M179 Osteoarthritis of knee, unspecified: Secondary | ICD-10-CM | POA: Diagnosis not present

## 2018-06-08 DIAGNOSIS — J449 Chronic obstructive pulmonary disease, unspecified: Secondary | ICD-10-CM | POA: Diagnosis not present

## 2018-06-08 DIAGNOSIS — I10 Essential (primary) hypertension: Secondary | ICD-10-CM | POA: Diagnosis not present

## 2018-06-08 DIAGNOSIS — E1140 Type 2 diabetes mellitus with diabetic neuropathy, unspecified: Secondary | ICD-10-CM | POA: Diagnosis not present

## 2018-06-08 DIAGNOSIS — D509 Iron deficiency anemia, unspecified: Secondary | ICD-10-CM | POA: Diagnosis not present

## 2018-06-10 NOTE — Progress Notes (Signed)
Anesthesia Chart Review   Case:  [id number] Date/Time:  06/13/18 1330   Procedure:  TOTAL KNEE ARTHROPLASTY (Left ) - 58min   Anesthesia type:  Choice   Pre-op diagnosis:  left knee osteoarthritis   Location:  [name] ROOM 09 / [facility]   Surgeon:  Gaynelle Arabian, MD      DISCUSSION: 78 yo never smoker with h/o DM II, DVT, OSA w/o CPAP use, GERD, HTN, COPD, multiple back surgeries w/chronic pain (SCS placed 2014), left knee OA scheduled for above surgery on 06/13/18 with [name].    Cardiac clearance received 03/21/18.  Per Lori Kail, PA, "The patient was recently cleared by Dr. Marlou [name] for back surgery.  No complications.  She is restarting her water aerobic exercise 3 times/week on 04/10/18.  Given past medical history and time since last visit, based on ACC/AHA guidelines, [name] would be at acceptable risk for the planned procedure without further cardiovascular testing."  She may hold ASA for 5 days prio to knee surgery per Dr. Marlou [name].    Clearance received from pt's PCP as well, Dr. Josetta [name], on 03/30/18 (on chart).  Per clearance pt is low risk for surgical procedure.    Pt can proceed with planned procedure barring acute status change.  VS: BP 122/74   Pulse 89   Temp 36.6 C (Oral)   Resp 18   Ht 5\' 5"  (1.651 m)   Wt 88.2 kg   LMP  (LMP Unknown)   SpO2 99%   BMI 32.36 kg/m   PROVIDERS: Lori Huddle, MD is PCP   [name], MD is Cardiologist  LABS: Labs reviewed: Acceptable for surgery. (all labs ordered are listed, but only abnormal results are displayed)  Labs Reviewed  CBC - Abnormal; Notable for the following components:      Result Value   WBC 10.8 (*)    All other components within normal limits  COMPREHENSIVE METABOLIC PANEL - Abnormal; Notable for the following components:   Glucose, Bld 144 (*)    BUN 39 (*)    Creatinine, Ser 1.08 (*)    GFR calc non Af Amer 50 (*)    GFR calc Af Amer 58 (*)    All other components within normal  limits  HEMOGLOBIN A1C - Abnormal; Notable for the following components:   Hgb A1c MFr Bld 7.2 (*)    All other components within normal limits  GLUCOSE, CAPILLARY - Abnormal; Notable for the following components:   Glucose-Capillary 171 (*)    All other components within normal limits  SURGICAL PCR SCREEN  APTT  PROTIME-INR  TYPE AND SCREEN     IMAGES: Chest Xray 02/22/18 FINDINGS: The lungs are mildly hyperinflated with hemidiaphragm flattening. There is no focal infiltrate. There is no pleural effusion. The heart and pulmonary vascularity are normal. There is nerve stimulator electrode projecting from the inferior aspect of T9 to the upper aspect of T11.  IMPRESSION: Mild chronic bronchitic changes. No pneumonia, CHF, nor other acute cardiopulmonary abnormality.  EKG: 01/28/18 Rate 81 bpm Normal sinus rhythm Possible left atrial enlargement Septal infarct, age undetermined Abnormal ECG  CV: Stress Test 04/01/16  Nuclear stress EF: 76%.  There was no ST segment deviation noted during stress.  The study is normal.  This is a low risk study.  The left ventricular ejection fraction is hyperdynamic (>65%). Past Medical History:  Diagnosis Date  . Anemia   . Arthritis   . Battery end of life of  spinal cord stimulator 2016   battery replaced.   Marland Kitchen COPD (chronic obstructive pulmonary disease) ([place])    'Mild' per pt report  . Diabetes mellitus   . DVT (deep venous thrombosis) ([place])   . Dyspnea    on exertion  . GERD (gastroesophageal reflux disease)   . History of kidney stones   . History of stomach ulcers   . Hypertension    dr [name]  . Neuropathy   . Rapid heartbeat   . Sleep apnea    borderline study; could use a CPAP if desired.     Past Surgical History:  Procedure Laterality Date  . ABDOMINAL HYSTERECTOMY  1979 & 1981   ovaries and uterus removed at different times.  . APPENDECTOMY    . ARTHOSCOPIC ROTAOR CUFF REPAIR Left 2010 or 2011  .  BACK SURGERY  x 2 1999 and 2008   lower back L 4 to L 5 L5 to S 1  . BREAST BIOPSY Bilateral 05/18/1997  . CARPAL TUNNEL RELEASE Right 2014  . CARPAL TUNNEL RELEASE Bilateral   . COLONOSCOPY WITH PROPOFOL N/A 10/25/2012   Procedure: COLONOSCOPY WITH PROPOFOL;  Surgeon: [name], MD;  Location: [facility];  Service: Endoscopy;  Laterality: N/A;  . EYE SURGERY     eyelid surgery  . JOINT REPLACEMENT Right 2012   knee  . KNEE ARTHROSCOPY Right 2004  . LUMBAR LAMINECTOMY/DECOMPRESSION MICRODISCECTOMY Bilateral 03/03/2018   Procedure: Laminectomy and Foraminotomy - Lumbar one-Lumbar two - bilateral Posterior lateral fusion;  Surgeon: [name], MD;  Location: [place];  Service: Neurosurgery;  Laterality: Bilateral;  . ovarian wedge resection  1966  . SPINAL CORD STIMULATOR INSERTION N/A 11/03/2012   Procedure: LUMBAR SPINAL CORD STIMULATOR INSERTION;  Surgeon: [name], MD;  Location: [place];  Service: Orthopedics;  Laterality: N/A;  . SPINAL FUSION  10/2016  . TONSILLECTOMY  1960    MEDICATIONS: . albuterol (PROVENTIL HFA;VENTOLIN HFA) 108 (90 Base) MCG/ACT inhaler  . aspirin EC 81 MG tablet  . B Complex-C (SUPER B COMPLEX PO)  . Biotin 5000 MCG TABS  . Calcium-Magnesium-Vitamin D (CALCIUM 1200+D3 PO)  . Co-Enzyme Q10 200 MG CAPS  . diclofenac sodium (VOLTAREN) 1 % GEL  . estradiol (ESTRACE) 1 MG tablet  . fexofenadine (ALLEGRA) 180 MG tablet  . fluticasone furoate-vilanterol (BREO ELLIPTA) 100-25 MCG/INH AEPB  . furosemide (LASIX) 20 MG tablet  . glipiZIDE (GLUCOTROL) 10 MG tablet  . HYDROcodone-acetaminophen (NORCO/VICODIN) 5-325 MG tablet  . Insulin Glargine (TOUJEO SOLOSTAR) 300 UNIT/ML SOPN  . losartan (COZAAR) 100 MG tablet  . Magnesium 500 MG TABS  . meloxicam (MOBIC) 7.5 MG tablet  . metFORMIN (GLUCOPHAGE) 1000 MG tablet  . methocarbamol (ROBAXIN) 500 MG tablet  . metoprolol succinate (TOPROL-XL) 50 MG 24 hr tablet  . Multiple Vitamins-Minerals  (MULTIVITAMIN WITH MINERALS) tablet  . Omega-3 Fatty Acids (FISH OIL) 1200 MG CAPS  . omeprazole (PRILOSEC) 20 MG capsule  . potassium chloride (K-DUR,KLOR-CON) 10 MEQ tablet  . rosuvastatin (CRESTOR) 10 MG tablet   No current facility-administered medications for this encounter.    Maia Plan [facility] Pre-Surgical Testing [phone] 06/10/18 1:07 PM

## 2018-06-10 NOTE — Anesthesia Preprocedure Evaluation (Addendum)
Anesthesia Evaluation  Patient identified by MRN, date of birth, ID band Patient awake    Reviewed: Allergy & Precautions, NPO status , Patient's Chart, lab work & pertinent test results  Airway Mallampati: III  TM Distance: >3 FB Neck ROM: Full    Dental no notable dental hx. (+) Teeth Intact   Pulmonary shortness of breath, sleep apnea , COPD,  COPD inhaler,    Pulmonary exam normal breath sounds clear to auscultation       Cardiovascular hypertension, Pt. on medications Normal cardiovascular exam Rhythm:Regular Rate:Normal     Neuro/Psych Peripheral neuropathy Spinal cord stimulator negative psych ROS   GI/Hepatic Neg liver ROS, GERD  Medicated and Controlled,  Endo/Other  diabetes, Well Controlled, Type 2, Insulin Dependent, Oral Hypoglycemic AgentsObesity  Renal/GU Hx/o renal calculi  negative genitourinary   Musculoskeletal  (+) Arthritis , Osteoarthritis,  OA left knee Chronic low back pain- S/P lumbar fusion and laminectomy   Abdominal (+) + obese,   Peds  Hematology  (+) anemia ,   Anesthesia Other Findings   Reproductive/Obstetrics                          Anesthesia Physical Anesthesia Plan  ASA: III  Anesthesia Plan: General   Post-op Pain Management:  Regional for Post-op pain   Induction: Intravenous  PONV Risk Score and Plan: 4 or greater and Ondansetron, Propofol infusion, Treatment may vary due to age or medical condition and Midazolam  Airway Management Planned: Oral ETT  Additional Equipment:   Intra-op Plan:   Post-operative Plan: Extubation in OR  Informed Consent: I have reviewed the patients History and Physical, chart, labs and discussed the procedure including the risks, benefits and alternatives for the proposed anesthesia with the patient or authorized representative who has indicated his/her understanding and acceptance.     Dental advisory  given  Plan Discussed with: CRNA and Surgeon  Anesthesia Plan Comments: (See PST note 06/06/2018, Konrad Felix, PA-C)     Anesthesia Quick Evaluation

## 2018-06-12 MED ORDER — TRANEXAMIC ACID 1000 MG/10ML IV SOLN
2000.0000 mg | INTRAVENOUS | Status: DC
  Filled 2018-06-12: qty 20

## 2018-06-12 MED ORDER — BUPIVACAINE LIPOSOME 1.3 % IJ SUSP
20.0000 mL | Freq: Once | INTRAMUSCULAR | Status: DC
  Filled 2018-06-12: qty 20

## 2018-06-13 ENCOUNTER — Encounter (HOSPITAL_COMMUNITY)
Admission: RE | Disposition: A | Discharge status: Home / Self Care | Payer: Self-pay | Source: Other Acute Inpatient Hospital | Attending: Orthopedic Surgery

## 2018-06-13 ENCOUNTER — Encounter (HOSPITAL_COMMUNITY): Payer: Self-pay | Admitting: Anesthesiology

## 2018-06-13 ENCOUNTER — Other Ambulatory Visit: Payer: Self-pay

## 2018-06-13 ENCOUNTER — Inpatient Hospital Stay (HOSPITAL_COMMUNITY)
Admission: RE | Admit: 2018-06-13 | Discharge: 2018-06-15 | DRG: 470 | Disposition: A | Discharge status: Home / Self Care | Payer: PPO | Source: Other Acute Inpatient Hospital | Attending: Orthopedic Surgery | Admitting: Orthopedic Surgery

## 2018-06-13 ENCOUNTER — Inpatient Hospital Stay (HOSPITAL_COMMUNITY): Payer: PPO | Admitting: Anesthesiology

## 2018-06-13 ENCOUNTER — Inpatient Hospital Stay (HOSPITAL_COMMUNITY): Payer: PPO | Admitting: Physician Assistant

## 2018-06-13 DIAGNOSIS — E1140 Type 2 diabetes mellitus with diabetic neuropathy, unspecified: Secondary | ICD-10-CM | POA: Diagnosis not present

## 2018-06-13 DIAGNOSIS — Z90710 Acquired absence of both cervix and uterus: Secondary | ICD-10-CM | POA: Diagnosis not present

## 2018-06-13 DIAGNOSIS — I10 Essential (primary) hypertension: Secondary | ICD-10-CM | POA: Diagnosis present

## 2018-06-13 DIAGNOSIS — Z9682 Presence of neurostimulator: Secondary | ICD-10-CM

## 2018-06-13 DIAGNOSIS — M25762 Osteophyte, left knee: Secondary | ICD-10-CM | POA: Diagnosis not present

## 2018-06-13 DIAGNOSIS — Z6833 Body mass index (BMI) 33.0-33.9, adult: Secondary | ICD-10-CM | POA: Diagnosis not present

## 2018-06-13 DIAGNOSIS — Z79899 Other long term (current) drug therapy: Secondary | ICD-10-CM | POA: Diagnosis not present

## 2018-06-13 DIAGNOSIS — E669 Obesity, unspecified: Secondary | ICD-10-CM | POA: Diagnosis not present

## 2018-06-13 DIAGNOSIS — G4730 Sleep apnea, unspecified: Secondary | ICD-10-CM | POA: Diagnosis present

## 2018-06-13 DIAGNOSIS — J449 Chronic obstructive pulmonary disease, unspecified: Secondary | ICD-10-CM | POA: Diagnosis not present

## 2018-06-13 DIAGNOSIS — Z833 Family history of diabetes mellitus: Secondary | ICD-10-CM

## 2018-06-13 DIAGNOSIS — K219 Gastro-esophageal reflux disease without esophagitis: Secondary | ICD-10-CM | POA: Diagnosis present

## 2018-06-13 DIAGNOSIS — M1710 Unilateral primary osteoarthritis, unspecified knee: Secondary | ICD-10-CM

## 2018-06-13 DIAGNOSIS — Z7982 Long term (current) use of aspirin: Secondary | ICD-10-CM

## 2018-06-13 DIAGNOSIS — G8918 Other acute postprocedural pain: Secondary | ICD-10-CM | POA: Diagnosis not present

## 2018-06-13 DIAGNOSIS — Z981 Arthrodesis status: Secondary | ICD-10-CM | POA: Diagnosis not present

## 2018-06-13 DIAGNOSIS — Z794 Long term (current) use of insulin: Secondary | ICD-10-CM

## 2018-06-13 DIAGNOSIS — Z86718 Personal history of other venous thrombosis and embolism: Secondary | ICD-10-CM

## 2018-06-13 DIAGNOSIS — M1712 Unilateral primary osteoarthritis, left knee: Principal | ICD-10-CM | POA: Diagnosis present

## 2018-06-13 DIAGNOSIS — Z8249 Family history of ischemic heart disease and other diseases of the circulatory system: Secondary | ICD-10-CM

## 2018-06-13 DIAGNOSIS — M179 Osteoarthritis of knee, unspecified: Secondary | ICD-10-CM | POA: Diagnosis present

## 2018-06-13 DIAGNOSIS — Z888 Allergy status to other drugs, medicaments and biological substances status: Secondary | ICD-10-CM

## 2018-06-13 HISTORY — PX: TOTAL KNEE ARTHROPLASTY: SHX125

## 2018-06-13 LAB — TYPE AND SCREEN
ABO/RH(D): B POS
Antibody Screen: NEGATIVE

## 2018-06-13 LAB — GLUCOSE, CAPILLARY
Glucose-Capillary: 130 mg/dL — ABNORMAL HIGH (ref 70–99)
Glucose-Capillary: 149 mg/dL — ABNORMAL HIGH (ref 70–99)
Glucose-Capillary: 116 mg/dL — ABNORMAL HIGH (ref 70–99)
Glucose-Capillary: 232 mg/dL — ABNORMAL HIGH (ref 70–99)

## 2018-06-13 SURGERY — ARTHROPLASTY, KNEE, TOTAL
Anesthesia: General | Site: Knee | Laterality: Left

## 2018-06-13 MED ORDER — SUCCINYLCHOLINE CHLORIDE 20 MG/ML IJ SOLN
INTRAMUSCULAR | Status: DC | PRN
  Administered 2018-06-13: 120 mg via INTRAVENOUS

## 2018-06-13 MED ORDER — SODIUM CHLORIDE 0.9 % IV SOLN
INTRAVENOUS | Status: DC
  Administered 2018-06-13: 17:00:00 via INTRAVENOUS

## 2018-06-13 MED ORDER — PANTOPRAZOLE SODIUM 40 MG PO TBEC
40.0000 mg | DELAYED_RELEASE_TABLET | Freq: Every day | ORAL | Status: DC
  Administered 2018-06-14 – 2018-06-15 (×2): 40 mg via ORAL
  Filled 2018-06-13 (×2): qty 1

## 2018-06-13 MED ORDER — BISACODYL 10 MG RE SUPP: 10.0000 mg | Freq: Every day | RECTAL | Status: DC | PRN

## 2018-06-13 MED ORDER — INSULIN ASPART 100 UNIT/ML ~~LOC~~ SOLN
0.0000 [IU] | Freq: Three times a day (TID) | SUBCUTANEOUS | Status: DC
  Administered 2018-06-14: 3 [IU] via SUBCUTANEOUS
  Administered 2018-06-14 (×2): 5 [IU] via SUBCUTANEOUS

## 2018-06-13 MED ORDER — FENTANYL CITRATE (PF) 100 MCG/2ML IJ SOLN
50.0000 ug | INTRAMUSCULAR | Status: DC
  Administered 2018-06-13: 50 ug via INTRAVENOUS
  Filled 2018-06-13: qty 2

## 2018-06-13 MED ORDER — ACETAMINOPHEN 10 MG/ML IV SOLN
1000.0000 mg | Freq: Four times a day (QID) | INTRAVENOUS | Status: DC
  Administered 2018-06-13: 1000 mg via INTRAVENOUS
  Filled 2018-06-13: qty 100

## 2018-06-13 MED ORDER — ESTRADIOL 1 MG PO TABS
1.0000 mg | ORAL_TABLET | Freq: Every evening | ORAL | Status: DC
  Administered 2018-06-13 – 2018-06-14 (×2): 1 mg via ORAL
  Filled 2018-06-13 (×2): qty 1

## 2018-06-13 MED ORDER — HYDROMORPHONE HCL 1 MG/ML IJ SOLN
INTRAMUSCULAR | Status: AC
  Filled 2018-06-13: qty 2

## 2018-06-13 MED ORDER — TRAMADOL HCL 50 MG PO TABS
50.0000 mg | ORAL_TABLET | Freq: Four times a day (QID) | ORAL | Status: DC | PRN
  Administered 2018-06-14 – 2018-06-15 (×2): 100 mg via ORAL
  Filled 2018-06-13 (×2): qty 2

## 2018-06-13 MED ORDER — ALBUTEROL SULFATE (2.5 MG/3ML) 0.083% IN NEBU: 3.0000 mL | INHALATION_SOLUTION | Freq: Four times a day (QID) | RESPIRATORY_TRACT | Status: DC | PRN

## 2018-06-13 MED ORDER — HYDROCODONE-ACETAMINOPHEN 7.5-325 MG PO TABS: 1.0000 | ORAL_TABLET | Freq: Once | ORAL | Status: DC | PRN

## 2018-06-13 MED ORDER — ROSUVASTATIN CALCIUM 10 MG PO TABS
10.0000 mg | ORAL_TABLET | Freq: Every evening | ORAL | Status: DC
  Administered 2018-06-13 – 2018-06-14 (×2): 10 mg via ORAL
  Filled 2018-06-13 (×2): qty 1

## 2018-06-13 MED ORDER — SODIUM CHLORIDE 0.9 % IR SOLN
Status: DC | PRN
  Administered 2018-06-13: 1000 mL

## 2018-06-13 MED ORDER — POTASSIUM CHLORIDE CRYS ER 10 MEQ PO TBCR
10.0000 meq | EXTENDED_RELEASE_TABLET | Freq: Every day | ORAL | Status: DC
  Administered 2018-06-14 – 2018-06-15 (×2): 10 meq via ORAL
  Filled 2018-06-13 (×2): qty 1

## 2018-06-13 MED ORDER — PROPOFOL 10 MG/ML IV BOLUS
INTRAVENOUS | Status: AC
  Filled 2018-06-13: qty 60

## 2018-06-13 MED ORDER — TRANEXAMIC ACID 1000 MG/10ML IV SOLN
INTRAVENOUS | Status: DC | PRN
  Administered 2018-06-13: 2000 mg via TOPICAL

## 2018-06-13 MED ORDER — MEPERIDINE HCL 50 MG/ML IJ SOLN: 6.2500 mg | INTRAMUSCULAR | Status: DC | PRN

## 2018-06-13 MED ORDER — MORPHINE SULFATE (PF) 2 MG/ML IV SOLN
1.0000 mg | INTRAVENOUS | Status: DC | PRN
  Administered 2018-06-14 (×2): 2 mg via INTRAVENOUS
  Filled 2018-06-13 (×2): qty 1

## 2018-06-13 MED ORDER — METHOCARBAMOL 500 MG IVPB - SIMPLE MED
500.0000 mg | Freq: Four times a day (QID) | INTRAVENOUS | Status: DC | PRN
  Administered 2018-06-13: 500 mg via INTRAVENOUS
  Filled 2018-06-13: qty 50

## 2018-06-13 MED ORDER — SODIUM CHLORIDE (PF) 0.9 % IJ SOLN
INTRAMUSCULAR | Status: DC | PRN
  Administered 2018-06-13: 60 mL

## 2018-06-13 MED ORDER — STERILE WATER FOR IRRIGATION IR SOLN
Status: DC | PRN
  Administered 2018-06-13: 2000 mL

## 2018-06-13 MED ORDER — LOSARTAN POTASSIUM 50 MG PO TABS
100.0000 mg | ORAL_TABLET | Freq: Every evening | ORAL | Status: DC
  Administered 2018-06-13 – 2018-06-14 (×2): 100 mg via ORAL
  Filled 2018-06-13 (×2): qty 2

## 2018-06-13 MED ORDER — CEFAZOLIN SODIUM-DEXTROSE 2-4 GM/100ML-% IV SOLN
2.0000 g | Freq: Four times a day (QID) | INTRAVENOUS | Status: AC
  Administered 2018-06-13 – 2018-06-14 (×2): 2 g via INTRAVENOUS
  Filled 2018-06-13 (×2): qty 100

## 2018-06-13 MED ORDER — FENTANYL CITRATE (PF) 100 MCG/2ML IJ SOLN
INTRAMUSCULAR | Status: DC | PRN
  Administered 2018-06-13: 50 ug via INTRAVENOUS
  Administered 2018-06-13: 100 ug via INTRAVENOUS

## 2018-06-13 MED ORDER — OXYCODONE HCL 5 MG PO TABS
5.0000 mg | ORAL_TABLET | ORAL | Status: DC | PRN
  Administered 2018-06-13 – 2018-06-15 (×10): 10 mg via ORAL
  Filled 2018-06-13 (×10): qty 2

## 2018-06-13 MED ORDER — BUPIVACAINE LIPOSOME 1.3 % IJ SUSP
INTRAMUSCULAR | Status: DC | PRN
  Administered 2018-06-13: 20 mL

## 2018-06-13 MED ORDER — CEFAZOLIN SODIUM-DEXTROSE 2-4 GM/100ML-% IV SOLN
2.0000 g | INTRAVENOUS | Status: AC
  Administered 2018-06-13: 2 g via INTRAVENOUS
  Filled 2018-06-13: qty 100

## 2018-06-13 MED ORDER — MENTHOL 3 MG MT LOZG: 1.0000 | LOZENGE | OROMUCOSAL | Status: DC | PRN

## 2018-06-13 MED ORDER — INSULIN ASPART 100 UNIT/ML ~~LOC~~ SOLN
0.0000 [IU] | Freq: Every day | SUBCUTANEOUS | Status: DC
  Administered 2018-06-13: 2 [IU] via SUBCUTANEOUS
  Administered 2018-06-14: 3 [IU] via SUBCUTANEOUS

## 2018-06-13 MED ORDER — HYDROMORPHONE HCL 1 MG/ML IJ SOLN
INTRAMUSCULAR | Status: AC
  Filled 2018-06-13: qty 1

## 2018-06-13 MED ORDER — PHENYLEPHRINE HCL 10 MG/ML IJ SOLN
INTRAMUSCULAR | Status: DC | PRN
  Administered 2018-06-13: 120 ug via INTRAVENOUS

## 2018-06-13 MED ORDER — RIVAROXABAN 10 MG PO TABS
10.0000 mg | ORAL_TABLET | Freq: Every day | ORAL | Status: DC
  Administered 2018-06-14 – 2018-06-15 (×2): 10 mg via ORAL
  Filled 2018-06-13 (×2): qty 1

## 2018-06-13 MED ORDER — METHOCARBAMOL 500 MG IVPB - SIMPLE MED
INTRAVENOUS | Status: AC
  Filled 2018-06-13: qty 50

## 2018-06-13 MED ORDER — INSULIN GLARGINE 100 UNIT/ML ~~LOC~~ SOLN
60.0000 [IU] | Freq: Every day | SUBCUTANEOUS | Status: DC
  Administered 2018-06-14 – 2018-06-15 (×2): 60 [IU] via SUBCUTANEOUS
  Filled 2018-06-13 (×2): qty 0.6

## 2018-06-13 MED ORDER — PROPOFOL 10 MG/ML IV BOLUS
INTRAVENOUS | Status: DC | PRN
  Administered 2018-06-13: 160 mg via INTRAVENOUS

## 2018-06-13 MED ORDER — FLEET ENEMA 7-19 GM/118ML RE ENEM: 1.0000 | ENEMA | Freq: Once | RECTAL | Status: DC | PRN

## 2018-06-13 MED ORDER — PHENOL 1.4 % MT LIQD
1.0000 | OROMUCOSAL | Status: DC | PRN
  Filled 2018-06-13: qty 177

## 2018-06-13 MED ORDER — ONDANSETRON HCL 4 MG/2ML IJ SOLN
INTRAMUSCULAR | Status: DC | PRN
  Administered 2018-06-13: 4 mg via INTRAVENOUS

## 2018-06-13 MED ORDER — SODIUM CHLORIDE (PF) 0.9 % IJ SOLN
INTRAMUSCULAR | Status: AC
  Filled 2018-06-13: qty 10

## 2018-06-13 MED ORDER — FENTANYL CITRATE (PF) 100 MCG/2ML IJ SOLN
INTRAMUSCULAR | Status: AC
  Filled 2018-06-13: qty 2

## 2018-06-13 MED ORDER — BUPIVACAINE-EPINEPHRINE (PF) 0.5% -1:200000 IJ SOLN
INTRAMUSCULAR | Status: DC | PRN
  Administered 2018-06-13: 30 mL via PERINEURAL

## 2018-06-13 MED ORDER — FUROSEMIDE 20 MG PO TABS: 20.0000 mg | ORAL_TABLET | Freq: Every day | ORAL | Status: DC | PRN

## 2018-06-13 MED ORDER — MAGNESIUM 500 MG PO TABS: 1000.0000 mg | ORAL_TABLET | Freq: Every day | ORAL | Status: DC

## 2018-06-13 MED ORDER — DOCUSATE SODIUM 100 MG PO CAPS
100.0000 mg | ORAL_CAPSULE | Freq: Two times a day (BID) | ORAL | Status: DC
  Administered 2018-06-13 – 2018-06-15 (×4): 100 mg via ORAL
  Filled 2018-06-13 (×4): qty 1

## 2018-06-13 MED ORDER — LACTATED RINGERS IV SOLN
INTRAVENOUS | Status: DC
  Administered 2018-06-13 (×2): via INTRAVENOUS

## 2018-06-13 MED ORDER — METOCLOPRAMIDE HCL 5 MG PO TABS: 5.0000 mg | ORAL_TABLET | Freq: Three times a day (TID) | ORAL | Status: DC | PRN

## 2018-06-13 MED ORDER — MAGNESIUM OXIDE 400 (241.3 MG) MG PO TABS
800.0000 mg | ORAL_TABLET | Freq: Every day | ORAL | Status: DC
  Administered 2018-06-13 – 2018-06-15 (×3): 800 mg via ORAL
  Filled 2018-06-13 (×3): qty 2

## 2018-06-13 MED ORDER — FLUTICASONE FUROATE-VILANTEROL 100-25 MCG/INH IN AEPB
2.0000 | INHALATION_SPRAY | Freq: Every day | RESPIRATORY_TRACT | Status: DC
  Administered 2018-06-14 – 2018-06-15 (×2): 2 via RESPIRATORY_TRACT
  Filled 2018-06-13: qty 28

## 2018-06-13 MED ORDER — METOCLOPRAMIDE HCL 5 MG/ML IJ SOLN: 10.0000 mg | Freq: Once | INTRAMUSCULAR | Status: DC | PRN

## 2018-06-13 MED ORDER — ONDANSETRON HCL 4 MG PO TABS: 4.0000 mg | ORAL_TABLET | Freq: Four times a day (QID) | ORAL | Status: DC | PRN

## 2018-06-13 MED ORDER — GLIPIZIDE 10 MG PO TABS
10.0000 mg | ORAL_TABLET | Freq: Two times a day (BID) | ORAL | Status: DC
  Administered 2018-06-13 – 2018-06-15 (×4): 10 mg via ORAL
  Filled 2018-06-13 (×4): qty 1

## 2018-06-13 MED ORDER — CHLORHEXIDINE GLUCONATE 4 % EX LIQD: 60.0000 mL | Freq: Once | CUTANEOUS | Status: DC

## 2018-06-13 MED ORDER — MIDAZOLAM HCL 2 MG/2ML IJ SOLN
1.0000 mg | INTRAMUSCULAR | Status: DC
  Administered 2018-06-13: 1 mg via INTRAVENOUS
  Filled 2018-06-13: qty 2

## 2018-06-13 MED ORDER — DEXAMETHASONE SODIUM PHOSPHATE 10 MG/ML IJ SOLN
INTRAMUSCULAR | Status: DC | PRN
  Administered 2018-06-13: 10 mg via INTRAVENOUS

## 2018-06-13 MED ORDER — LORATADINE 10 MG PO TABS
10.0000 mg | ORAL_TABLET | Freq: Every day | ORAL | Status: DC
  Administered 2018-06-14 – 2018-06-15 (×2): 10 mg via ORAL
  Filled 2018-06-13 (×2): qty 1

## 2018-06-13 MED ORDER — METHOCARBAMOL 500 MG PO TABS
500.0000 mg | ORAL_TABLET | Freq: Four times a day (QID) | ORAL | Status: DC | PRN
  Administered 2018-06-14 – 2018-06-15 (×5): 500 mg via ORAL
  Filled 2018-06-13 (×5): qty 1

## 2018-06-13 MED ORDER — METOCLOPRAMIDE HCL 5 MG/ML IJ SOLN: 5.0000 mg | Freq: Three times a day (TID) | INTRAMUSCULAR | Status: DC | PRN

## 2018-06-13 MED ORDER — LIDOCAINE 2% (20 MG/ML) 5 ML SYRINGE
INTRAMUSCULAR | Status: DC | PRN
  Administered 2018-06-13: 60 mg via INTRAVENOUS

## 2018-06-13 MED ORDER — DIPHENHYDRAMINE HCL 12.5 MG/5ML PO ELIX: 12.5000 mg | ORAL_SOLUTION | ORAL | Status: DC | PRN

## 2018-06-13 MED ORDER — SODIUM CHLORIDE (PF) 0.9 % IJ SOLN
INTRAMUSCULAR | Status: AC
  Filled 2018-06-13: qty 50

## 2018-06-13 MED ORDER — ACETAMINOPHEN 500 MG PO TABS
1000.0000 mg | ORAL_TABLET | Freq: Four times a day (QID) | ORAL | Status: AC
  Administered 2018-06-13 – 2018-06-14 (×4): 1000 mg via ORAL
  Filled 2018-06-13 (×4): qty 2

## 2018-06-13 MED ORDER — ONDANSETRON HCL 4 MG/2ML IJ SOLN: 4.0000 mg | Freq: Four times a day (QID) | INTRAMUSCULAR | Status: DC | PRN

## 2018-06-13 MED ORDER — HYDROMORPHONE HCL 1 MG/ML IJ SOLN
0.2500 mg | INTRAMUSCULAR | Status: DC | PRN
  Administered 2018-06-13 (×6): 0.5 mg via INTRAVENOUS

## 2018-06-13 MED ORDER — POLYETHYLENE GLYCOL 3350 17 G PO PACK: 17.0000 g | PACK | Freq: Every day | ORAL | Status: DC | PRN

## 2018-06-13 MED ORDER — METOPROLOL SUCCINATE ER 50 MG PO TB24
50.0000 mg | ORAL_TABLET | Freq: Two times a day (BID) | ORAL | Status: DC
  Administered 2018-06-13 – 2018-06-15 (×4): 50 mg via ORAL
  Filled 2018-06-13 (×4): qty 1

## 2018-06-13 SURGICAL SUPPLY — 54 items
BAG ZIPLOCK 12X15 (MISCELLANEOUS) ×2 IMPLANT
BANDAGE ACE 6X5 VEL STRL LF (GAUZE/BANDAGES/DRESSINGS) ×2 IMPLANT
BLADE SAG 18X100X1.27 (BLADE) ×2 IMPLANT
BLADE SAW SGTL 11.0X1.19X90.0M (BLADE) ×2 IMPLANT
BLADE SURG SZ10 CARB STEEL (BLADE) ×4 IMPLANT
BOWL SMART MIX CTS (DISPOSABLE) ×2 IMPLANT
CEMENT HV SMART SET (Cement) ×4 IMPLANT
CEMENT TIBIA MBT (Knees) ×1 IMPLANT
COVER SURGICAL LIGHT HANDLE (MISCELLANEOUS) ×2 IMPLANT
CUFF TOURN SGL QUICK 34 (TOURNIQUET CUFF) ×1
CUFF TRNQT CYL 34X4X40X1 (TOURNIQUET CUFF) ×1 IMPLANT
DECANTER SPIKE VIAL GLASS SM (MISCELLANEOUS) ×2 IMPLANT
DRAPE U-SHAPE 47X51 STRL (DRAPES) ×2 IMPLANT
DRSG ADAPTIC 3X8 NADH LF (GAUZE/BANDAGES/DRESSINGS) ×2 IMPLANT
DRSG PAD ABDOMINAL 8X10 ST (GAUZE/BANDAGES/DRESSINGS) ×2 IMPLANT
DURAPREP 26ML APPLICATOR (WOUND CARE) ×2 IMPLANT
ELECT REM PT RETURN 15FT ADLT (MISCELLANEOUS) ×2 IMPLANT
EVACUATOR 1/8 PVC DRAIN (DRAIN) ×2 IMPLANT
FEMUR SIGMA PS KNEE SZ 4.0N L (Femur) ×2 IMPLANT
GAUZE SPONGE 4X4 12PLY STRL (GAUZE/BANDAGES/DRESSINGS) ×2 IMPLANT
GLOVE BIO SURGEON STRL SZ8 (GLOVE) ×2 IMPLANT
GLOVE BIOGEL PI IND STRL 6.5 (GLOVE) ×1 IMPLANT
GLOVE BIOGEL PI IND STRL 7.5 (GLOVE) ×4 IMPLANT
GLOVE BIOGEL PI IND STRL 8 (GLOVE) ×2 IMPLANT
GLOVE BIOGEL PI INDICATOR 6.5 (GLOVE) ×1
GLOVE BIOGEL PI INDICATOR 7.5 (GLOVE) ×4
GLOVE BIOGEL PI INDICATOR 8 (GLOVE) ×2
GLOVE SURG SS PI 6.5 STRL IVOR (GLOVE) ×2 IMPLANT
GLOVE SURG SS PI 7.5 STRL IVOR (GLOVE) ×2 IMPLANT
GOWN STRL REUS W/TWL LRG LVL3 (GOWN DISPOSABLE) ×4 IMPLANT
GOWN STRL REUS W/TWL XL LVL3 (GOWN DISPOSABLE) ×4 IMPLANT
HANDPIECE INTERPULSE COAX TIP (DISPOSABLE) ×1
HOLDER FOLEY CATH W/STRAP (MISCELLANEOUS) ×2 IMPLANT
IMMOBILIZER KNEE 20 (SOFTGOODS) ×2
IMMOBILIZER KNEE 20 THIGH 36 (SOFTGOODS) ×1 IMPLANT
MANIFOLD NEPTUNE II (INSTRUMENTS) ×2 IMPLANT
PACK TOTAL KNEE CUSTOM (KITS) ×2 IMPLANT
PAD ABD 8X10 STRL (GAUZE/BANDAGES/DRESSINGS) ×2 IMPLANT
PADDING CAST COTTON 6X4 STRL (CAST SUPPLIES) ×6 IMPLANT
PATELLA DOME PFC 35MM (Knees) ×2 IMPLANT
PIN STEINMAN FIXATION KNEE (PIN) ×2 IMPLANT
PLATE ROT INSERT 10MM SIZE 4 (Plate) ×2 IMPLANT
PROTECTOR NERVE ULNAR (MISCELLANEOUS) ×2 IMPLANT
SET HNDPC FAN SPRY TIP SCT (DISPOSABLE) ×1 IMPLANT
STRIP CLOSURE SKIN 1/2X4 (GAUZE/BANDAGES/DRESSINGS) ×4 IMPLANT
SUT MNCRL AB 4-0 PS2 18 (SUTURE) ×2 IMPLANT
SUT STRATAFIX 0 PDS 27 VIOLET (SUTURE) ×2
SUT VIC AB 2-0 CT1 27 (SUTURE) ×3
SUT VIC AB 2-0 CT1 TAPERPNT 27 (SUTURE) ×3 IMPLANT
SUTURE STRATFX 0 PDS 27 VIOLET (SUTURE) ×1 IMPLANT
TIBIA MBT CEMENT (Knees) ×2 IMPLANT
TRAY FOLEY MTR SLVR 14FR STAT (SET/KITS/TRAYS/PACK) ×2 IMPLANT
WRAP KNEE MAXI GEL POST OP (GAUZE/BANDAGES/DRESSINGS) ×2 IMPLANT
YANKAUER SUCT BULB TIP 10FT TU (MISCELLANEOUS) ×2 IMPLANT

## 2018-06-13 NOTE — Anesthesia Postprocedure Evaluation (Signed)
Anesthesia Post Note  Patient: [name]  Procedure(s) Performed: TOTAL KNEE ARTHROPLASTY (Left Knee)     Patient location during evaluation: PACU Anesthesia Type: General Level of consciousness: awake and alert and oriented Pain management: pain level controlled Vital Signs Assessment: post-procedure vital signs reviewed and stable Respiratory status: spontaneous breathing, nonlabored ventilation and respiratory function stable Cardiovascular status: blood pressure returned to baseline and stable Postop Assessment: no apparent nausea or vomiting Anesthetic complications: no    Last Vitals:  Vitals:   06/13/18 1615 06/13/18 1630  BP: 134/73 126/70  Pulse: 70 73  Resp: (!) 9 12  Temp:  (!) 36.3 C  SpO2: 100% 100%    Last Pain:  Vitals:   06/13/18 1630  TempSrc:   PainSc: Asleep                 Sabatino Williard A.

## 2018-06-13 NOTE — Progress Notes (Signed)
PT Cancellation Note  Patient Details Name: [name] MRN: [id number] DOB: [birth year]   Cancelled Treatment:    Reason Eval/Treat Not Completed: Patient declined, no reason specified;Fatigue/lethargy limiting ability to participate - Pt deferred mobility with PT this pm, even with encouragement from PT. Pt states she will mobilize tomorrow am. PT to continue to follow acutely.  Julien Girt, PT Acute Rehabilitation Services Pager [phone]  Office [place] 06/13/2018, 7:44 PM

## 2018-06-13 NOTE — Interval H&P Note (Signed)
History and Physical Interval Note:  06/13/2018 11:51 AM  [name]  has presented today for surgery, with the diagnosis of left knee osteoarthritis  The various methods of treatment have been discussed with the patient and family. After consideration of risks, benefits and other options for treatment, the patient has consented to  Procedure(s) with comments: TOTAL KNEE ARTHROPLASTY (Left) - 24min as a surgical intervention .  The patient's history has been reviewed, patient examined, no change in status, stable for surgery.  I have reviewed the patient's chart and labs.  Questions were answered to the patient's satisfaction.     Pilar Plate Johnel Yielding

## 2018-06-13 NOTE — Discharge Instructions (Addendum)
° °Dr. Frank Aluisio °Total Joint Specialist °[facility] °[address] °[place], [place] [address] °[phone] ° °TOTAL KNEE REPLACEMENT POSTOPERATIVE DIRECTIONS ° °Knee Rehabilitation, Guidelines Following Surgery  °Results after knee surgery are often greatly improved when you follow the exercise, range of motion and muscle strengthening exercises prescribed by your doctor. Safety measures are also important to protect the knee from further injury. Any time any of these exercises cause you to have increased pain or swelling in your knee joint, decrease the amount until you are comfortable again and slowly increase them. If you have problems or questions, call your caregiver or physical therapist for advice.  ° °HOME CARE INSTRUCTIONS  °Remove items at home which could result in a fall. This includes throw rugs or furniture in walking pathways.  °· ICE to the affected knee every three hours for 30 minutes at a time and then as needed for pain and swelling.  Continue to use ice on the knee for pain and swelling from surgery. You may notice swelling that will progress down to the foot and ankle.  This is normal after surgery.  Elevate the leg when you are not up walking on it.   °· Continue to use the breathing machine which will help keep your temperature down.  It is common for your temperature to cycle up and down following surgery, especially at night when you are not up moving around and exerting yourself.  The breathing machine keeps your lungs expanded and your temperature down. °· Do not place pillow under knee, focus on keeping the knee straight while resting ° °DIET °You may resume your previous home diet once your are discharged from the hospital. ° °DRESSING / WOUND CARE / SHOWERING °You may shower 3 days after surgery, but keep the wounds dry during showering.  You may use an occlusive plastic wrap (Press'n Seal for example), NO SOAKING/SUBMERGING IN THE BATHTUB.  If the bandage gets  wet, change with a clean dry gauze.  If the incision gets wet, pat the wound dry with a clean towel. °You may start showering once you are discharged home but do not submerge the incision under water. Just pat the incision dry and apply a dry gauze dressing on daily. °Change the surgical dressing daily and reapply a dry dressing each time. ° °ACTIVITY °Walk with your walker as instructed. °Use walker as long as suggested by your caregivers. °Avoid periods of inactivity such as sitting longer than an hour when not asleep. This helps prevent blood clots.  °You may resume a sexual relationship in one month or when given the OK by your doctor.  °You may return to work once you are cleared by your doctor.  °Do not drive a car for 6 weeks or until released by you surgeon.  °Do not drive while taking narcotics. ° °WEIGHT BEARING °Weight bearing as tolerated with assist device (walker, cane, etc) as directed, use it as long as suggested by your surgeon or therapist, typically at least 4-6 weeks. ° °POSTOPERATIVE CONSTIPATION PROTOCOL °Constipation - defined medically as fewer than three stools per week and severe constipation as less than one stool per week. ° °One of the most common issues patients have following surgery is constipation.  Even if you have a regular bowel pattern at home, your normal regimen is likely to be disrupted due to multiple reasons following surgery.  Combination of anesthesia, postoperative narcotics, change in appetite and fluid intake all can affect your bowels.    In order to avoid complications following surgery, here are some recommendations in order to help you during your recovery period. ° °Colace (docusate) - Pick up an over-the-counter form of Colace or another stool softener and take twice a day as long as you are requiring postoperative pain medications.  Take with a full glass of water daily.  If you experience loose stools or diarrhea, hold the colace until you stool forms back up.  If  your symptoms do not get better within 1 week or if they get worse, check with your doctor. ° °Dulcolax (bisacodyl) - Pick up over-the-counter and take as directed by the product packaging as needed to assist with the movement of your bowels.  Take with a full glass of water.  Use this product as needed if not relieved by Colace only.  ° °MiraLax (polyethylene glycol) - Pick up over-the-counter to have on hand.  MiraLax is a solution that will increase the amount of water in your bowels to assist with bowel movements.  Take as directed and can mix with a glass of water, juice, soda, coffee, or tea.  Take if you go more than two days without a movement. °Do not use MiraLax more than once per day. Call your doctor if you are still constipated or irregular after using this medication for 7 days in a row. ° °If you continue to have problems with postoperative constipation, please contact the office for further assistance and recommendations.  If you experience "the worst abdominal pain ever" or develop nausea or vomiting, please contact the office immediatly for further recommendations for treatment. ° °ITCHING ° If you experience itching with your medications, try taking only a single pain pill, or even half a pain pill at a time.  You can also use Benadryl over the counter for itching or also to help with sleep.  ° °TED HOSE STOCKINGS °Wear the elastic stockings on both legs for three weeks following surgery during the day but you may remove then at night for sleeping. ° °MEDICATIONS °See your medication summary on the “After Visit Summary” that the nursing staff will review with you prior to discharge.  You may have some home medications which will be placed on hold until you complete the course of blood thinner medication.  It is important for you to complete the blood thinner medication as prescribed by your surgeon.  Continue your approved medications as instructed at time of discharge. ° °PRECAUTIONS °If you  experience chest pain or shortness of breath - call 911 immediately for transfer to the hospital emergency department.  °If you develop a fever greater that 101 F, purulent drainage from wound, increased redness or drainage from wound, foul odor from the wound/dressing, or calf pain - CONTACT YOUR SURGEON.   °                                                °FOLLOW-UP APPOINTMENTS °Make sure you keep all of your appointments after your operation with your surgeon and caregivers. You should call the office at the above phone number and make an appointment for approximately two weeks after the date of your surgery or on the date instructed by your surgeon outlined in the "After Visit Summary". ° ° °RANGE OF MOTION AND STRENGTHENING EXERCISES  °Rehabilitation of the knee is important following a knee injury or   an operation. After just a few days of immobilization, the muscles of the thigh which control the knee become weakened and shrink (atrophy). Knee exercises are designed to build up the tone and strength of the thigh muscles and to improve knee motion. Often times heat used for twenty to thirty minutes before working out will loosen up your tissues and help with improving the range of motion but do not use heat for the first two weeks following surgery. These exercises can be done on a training (exercise) mat, on the floor, on a table or on a bed. Use what ever works the best and is most comfortable for you Knee exercises include:  °Leg Lifts - While your knee is still immobilized in a splint or cast, you can do straight leg raises. Lift the leg to 60 degrees, hold for 3 sec, and slowly lower the leg. Repeat 10-20 times 2-3 times daily. Perform this exercise against resistance later as your knee gets better.  °Quad and Hamstring Sets - Tighten up the muscle on the front of the thigh (Quad) and hold for 5-10 sec. Repeat this 10-20 times hourly. Hamstring sets are done by pushing the foot backward against an object  and holding for 5-10 sec. Repeat as with quad sets.  °· Leg Slides: Lying on your back, slowly slide your foot toward your buttocks, bending your knee up off the floor (only go as far as is comfortable). Then slowly slide your foot back down until your leg is flat on the floor again. °· Angel Wings: Lying on your back spread your legs to the side as far apart as you can without causing discomfort.  °A rehabilitation program following serious knee injuries can speed recovery and prevent re-injury in the future due to weakened muscles. Contact your doctor or a physical therapist for more information on knee rehabilitation.  ° °IF YOU ARE TRANSFERRED TO A SKILLED REHAB FACILITY °If the patient is transferred to a skilled rehab facility following release from the hospital, a list of the current medications will be sent to the facility for the patient to continue.  When discharged from the skilled rehab facility, please have the facility set up the patient's Home Health Physical Therapy prior to being released. Also, the skilled facility will be responsible for providing the patient with their medications at time of release from the facility to include their pain medication, the muscle relaxants, and their blood thinner medication. If the patient is still at the rehab facility at time of the two week follow up appointment, the skilled rehab facility will also need to assist the patient in arranging follow up appointment in our office and any transportation needs. ° °MAKE SURE YOU:  °Understand these instructions.  °Get help right away if you are not doing well or get worse.  ° ° °Pick up stool softner and laxative for home use following surgery while on pain medications. °Do not submerge incision under water. °Please use good hand washing techniques while changing dressing each day. °May shower starting three days after surgery. °Please use a clean towel to pat the incision dry following showers. °Continue to use ice for  pain and swelling after surgery. °Do not use any lotions or creams on the incision until instructed by your surgeon. ° °Information on my medicine - XARELTO® (Rivaroxaban) ° ° ° °Why was Xarelto® prescribed for you? °Xarelto® was prescribed for you to reduce the risk of blood clots forming after orthopedic surgery. The medical term for   these abnormal blood clots is venous thromboembolism (VTE). ° °What do you need to know about xarelto® ? °Take your Xarelto® ONCE DAILY at the same time every day. °You may take it either with or without food. ° °If you have difficulty swallowing the tablet whole, you may crush it and mix in applesauce just prior to taking your dose. ° °Take Xarelto® exactly as prescribed by your doctor and DO NOT stop taking Xarelto® without talking to the doctor who prescribed the medication.  Stopping without other VTE prevention medication to take the place of Xarelto® may increase your risk of developing a clot. ° °After discharge, you should have regular check-up appointments with your healthcare provider that is prescribing your Xarelto®.   ° °What do you do if you miss a dose? °If you miss a dose, take it as soon as you remember on the same day then continue your regularly scheduled once daily regimen the next day. Do not take two doses of Xarelto® on the same day.  ° °Important Safety Information °A possible side effect of Xarelto® is bleeding. You should call your healthcare provider right away if you experience any of the following: °? Bleeding from an injury or your nose that does not stop. °? Unusual colored urine (red or dark brown) or unusual colored stools (red or black). °? Unusual bruising for unknown reasons. °? A serious fall or if you hit your head (even if there is no bleeding). ° °Some medicines may interact with Xarelto® and might increase your risk of bleeding while on Xarelto®. To help avoid this, consult your healthcare provider or pharmacist prior to using any new  prescription or non-prescription medications, including herbals, vitamins, non-steroidal anti-inflammatory drugs (NSAIDs) and supplements. ° °This website has more information on Xarelto®: www.xarelto.com. ° ° °

## 2018-06-13 NOTE — Transfer of Care (Signed)
Immediate Anesthesia Transfer of Care Note  Patient: [name]  Procedure(s) Performed: TOTAL KNEE ARTHROPLASTY (Left Knee)  Patient Location: PACU  Anesthesia Type:General and regional for post op pain relief.  Level of Consciousness: awake, alert , oriented and patient cooperative  Airway & Oxygen Therapy: Patient Spontanous Breathing and Patient connected to face mask oxygen  Post-op Assessment: Report given to RN and Post -op Vital signs reviewed and stable  Post vital signs: Reviewed and stable  Last Vitals:  Vitals Value Taken Time  BP    Temp    Pulse    Resp    SpO2      Last Pain:  Vitals:   06/13/18 1217  TempSrc:   PainSc: 6          Complications: No apparent anesthesia complications

## 2018-06-13 NOTE — Op Note (Signed)
OPERATIVE REPORT-TOTAL KNEE ARTHROPLASTY   Pre-operative diagnosis- Osteoarthritis  Left knee(s)  Post-operative diagnosis- Osteoarthritis Left knee(s)  Procedure-  Left  Total Knee Arthroplasty  Surgeon- Dione Plover. India Jolin, MD  Assistant- Ardeen Jourdain, PA-C   Anesthesia-  GA combined with regional for post-op pain  EBL- 25 ml   Drains Hemovac  Tourniquet time-  Total Tourniquet Time Documented: Thigh (Left) - 41 minutes Total: Thigh (Left) - 41 minutes     Complications- None  Condition-PACU - hemodynamically stable.   Brief Clinical Note  [name] is a 78 y.o. year old female with end stage OA of her left knee with progressively worsening pain and dysfunction. She has constant pain, with activity and at rest and significant functional deficits with difficulties even with ADLs. She has had extensive non-op management including analgesics, injections of cortisone and viscosupplements, and home exercise program, but remains in significant pain with significant dysfunction. Radiographs show bone on bone arthritis lateral and patellofemoral. She presents now for left Total Knee Arthroplasty.    Procedure in detail---   The patient is brought into the operating room and positioned supine on the operating table. After successful administration of  GA combined with regional for post-op pain,   a tourniquet is placed high on the  Left thigh(s) and the lower extremity is prepped and draped in the usual sterile fashion. Time out is performed by the operating team and then the  Left lower extremity is wrapped in Esmarch, knee flexed and the tourniquet inflated to 300 mmHg.       A midline incision is made with a ten blade through the subcutaneous tissue to the level of the extensor mechanism. A fresh blade is used to make a medial parapatellar arthrotomy. Soft tissue over the proximal medial tibia is subperiosteally elevated to the joint line with a knife and into the  semimembranosus bursa with a Cobb elevator. Soft tissue over the proximal lateral tibia is elevated with attention being paid to avoiding the patellar tendon on the tibial tubercle. The patella is everted, knee flexed 90 degrees and the ACL and PCL are removed. Findings are bone on bone lateral and patellofemoral with large global osteophytes.        The drill is used to create a starting hole in the distal femur and the canal is thoroughly irrigated with sterile saline to remove the fatty contents. The 5 degree Left  valgus alignment guide is placed into the femoral canal and the distal femoral cutting block is pinned to remove 10 mm off the distal femur. Resection is made with an oscillating saw.      The tibia is subluxed forward and the menisci are removed. The extramedullary alignment guide is placed referencing proximally at the medial aspect of the tibial tubercle and distally along the second metatarsal axis and tibial crest. The block is pinned to remove 45mm off the more deficient lateral  side. Resection is made with an oscillating saw. Size 3is the most appropriate size for the tibia and the proximal tibia is prepared with the modular drill and keel punch for that size.      The femoral sizing guide is placed and size 4 is most appropriate. Rotation is marked off the epicondylar axis and confirmed by creating a rectangular flexion gap at 90 degrees. The size 4 cutting block is pinned in this rotation and the anterior, posterior and chamfer cuts are made with the oscillating saw. The intercondylar block is then placed and  that cut is made.      Trial size 3 tibial component, trial size 4 narrow posterior stabilized femur and a 10  mm posterior stabilized rotating platform insert trial is placed. Full extension is achieved with excellent varus/valgus and anterior/posterior balance throughout full range of motion. The patella is everted and thickness measured to be 22  mm. Free hand resection is taken to  12 mm, a 35 template is placed, lug holes are drilled, trial patella is placed, and it tracks normally. Osteophytes are removed off the posterior femur with the trial in place. All trials are removed and the cut bone surfaces prepared with pulsatile lavage. Cement is mixed and once ready for implantation, the size 3 tibial implant, size  4 narrow posterior stabilized femoral component, and the size 35 patella are cemented in place and the patella is held with the clamp. The trial insert is placed and the knee held in full extension. The Exparel (20 ml mixed with 60 ml saline) is injected into the extensor mechanism, posterior capsule, medial and lateral gutters and subcutaneous tissues.  All extruded cement is removed and once the cement is hard the permanent 10 mm posterior stabilized rotating platform insert is placed into the tibial tray.      The wound is copiously irrigated with saline solution and the extensor mechanism closed over a hemovac drain with #1 V-loc suture. The tourniquet is released for a total tourniquet time of 41  minutes. Flexion against gravity is 140 degrees and the patella tracks normally. Subcutaneous tissue is closed with 2.0 vicryl and subcuticular with running 4.0 Monocryl. The incision is cleaned and dried and steri-strips and a bulky sterile dressing are applied. The limb is placed into a knee immobilizer and the patient is awakened and transported to recovery in stable condition.      Please note that a surgical assistant was a medical necessity for this procedure in order to perform it in a safe and expeditious manner. Surgical assistant was necessary to retract the ligaments and vital neurovascular structures to prevent injury to them and also necessary for proper positioning of the limb to allow for anatomic placement of the prosthesis.   Dione Plover Tiernan Millikin, MD    06/13/2018, 3:17 PM

## 2018-06-13 NOTE — Anesthesia Procedure Notes (Signed)
Anesthesia Regional Block: Adductor canal block   Pre-Anesthetic Checklist: ,, timeout performed, Correct Patient, Correct Site, Correct Laterality, Correct Procedure, Correct Position, site marked, Risks and benefits discussed,  Surgical consent,  Pre-op evaluation,  At surgeon's request and post-op pain management  Laterality: Left  Prep: chloraprep       Needles:  Injection technique: Single-shot  Needle Type: Echogenic Stimulator Needle     Needle Length: 9cm  Needle Gauge: 21   Needle insertion depth: 7 cm   Additional Needles:   Procedures:,,,, ultrasound used (permanent image in chart),,,,  Narrative:  Start time: 06/13/2018 1:15 PM End time: 06/13/2018 1:19 PM Injection made incrementally with aspirations every 5 mL.  Performed by: Personally  Anesthesiologist: Josephine Igo, MD  Additional Notes: Timeout performed. Patient sedated. Relevant anatomy ID'd using [place]. Incremental 2-9ml injection of LA with frequent aspiration. Patient tolerated procedure well.        Left Adductor Canal Block

## 2018-06-13 NOTE — Progress Notes (Signed)
AssistedDr. Foster with left, ultrasound guided, adductor canal block. Side rails up, monitors on throughout procedure. See vital signs in flow sheet. Tolerated Procedure well.  

## 2018-06-13 NOTE — Anesthesia Procedure Notes (Signed)
Procedure Name: Intubation Performed by: Gean Maidens, CRNA Pre-anesthesia Checklist: Patient identified, Emergency Drugs available, Suction available, Patient being monitored and Timeout performed Patient Re-evaluated:Patient Re-evaluated prior to induction Oxygen Delivery Method: Circle system utilized Preoxygenation: Pre-oxygenation with 100% oxygen Induction Type: IV induction Ventilation: Mask ventilation without difficulty Laryngoscope Size: Mac and 4 Grade View: Grade I Tube type: Oral Tube size: 7.5 mm Number of attempts: 1 Airway Equipment and Method: Stylet Placement Confirmation: ETT inserted through vocal cords under direct vision,  positive ETCO2,  CO2 detector and breath sounds checked- equal and bilateral Secured at: 21 cm Tube secured with: Tape Dental Injury: Teeth and Oropharynx as per pre-operative assessment

## 2018-06-14 ENCOUNTER — Encounter (HOSPITAL_COMMUNITY): Payer: Self-pay | Admitting: Orthopedic Surgery

## 2018-06-14 LAB — GLUCOSE, CAPILLARY
Glucose-Capillary: 177 mg/dL — ABNORMAL HIGH (ref 70–99)
Glucose-Capillary: 217 mg/dL — ABNORMAL HIGH (ref 70–99)
Glucose-Capillary: 211 mg/dL — ABNORMAL HIGH (ref 70–99)
Glucose-Capillary: 162 mg/dL — ABNORMAL HIGH (ref 70–99)

## 2018-06-14 LAB — CBC
HCT: 33.8 % — ABNORMAL LOW (ref 36.0–46.0)
Hemoglobin: 10.5 g/dL — ABNORMAL LOW (ref 12.0–15.0)
MCH: 26.1 pg (ref 26.0–34.0)
MCHC: 31.1 g/dL (ref 30.0–36.0)
MCV: 84.1 fL (ref 80.0–100.0)
Platelets: 225 10*3/uL (ref 150–400)
RBC: 4.02 MIL/uL (ref 3.87–5.11)
RDW: 14.1 % (ref 11.5–15.5)
WBC: 12.9 10*3/uL — ABNORMAL HIGH (ref 4.0–10.5)
nRBC: 0 % (ref 0.0–0.2)

## 2018-06-14 LAB — BASIC METABOLIC PANEL
Anion gap: 6 (ref 5–15)
BUN: 24 mg/dL — ABNORMAL HIGH (ref 8–23)
CO2: 22 mmol/L (ref 22–32)
Calcium: 8.8 mg/dL — ABNORMAL LOW (ref 8.9–10.3)
Chloride: 108 mmol/L (ref 98–111)
Creatinine, Ser: 1.07 mg/dL — ABNORMAL HIGH (ref 0.44–1.00)
GFR calc Af Amer: 58 mL/min — ABNORMAL LOW (ref >60)
GFR calc non Af Amer: 50 mL/min — ABNORMAL LOW (ref >60)
Glucose, Bld: 272 mg/dL — ABNORMAL HIGH (ref 70–99)
Potassium: 4.5 mmol/L (ref 3.5–5.1)
Sodium: 136 mmol/L (ref 135–145)

## 2018-06-14 NOTE — Progress Notes (Signed)
Physical Therapy Treatment Patient Details Name: [name] MRN: [id number] DOB: 1941/01/20 Today's Date: 06/14/2018    History of Present Illness 78 yo female s/p L TKA 06/13/18. Hx of multiple lumbar surgeries, neuropathy, DM, spinal cord stimulator.     PT Comments    Progressing with mobility.    Follow Up Recommendations  Follow surgeon's recommendation for DC plan and follow-up therapies;Supervision for mobility/OOB     Equipment Recommendations  None recommended by PT    Recommendations for Other Services       Precautions / Restrictions Precautions Precautions: Fall;Knee Required Braces or Orthoses: Knee Immobilizer - Left Knee Immobilizer - Left: Discontinue once straight leg raise with < 10 degree lag Restrictions Weight Bearing Restrictions: No Other Position/Activity Restrictions: WBAT    Mobility  Bed Mobility Overal bed mobility: Needs Assistance Bed Mobility: Sit to Supine      Sit to supine: Min assist;HOB elevated   General bed mobility comments: close guard for safety. Increased time.   Transfers Overall transfer level: Needs assistance Equipment used: Rolling walker (2 wheeled) Transfers: Sit to/from Stand Sit to Stand: Min guard         General transfer comment: Close guard for safety. VCs safety, technique, hand/LE placement  Ambulation/Gait Ambulation/Gait assistance: Min guard Gait Distance (Feet): 85 Feet Assistive device: Rolling walker (2 wheeled) Gait Pattern/deviations: Step-to pattern     General Gait Details: VCs safety, technique, sequence. Intermittent assist to steady. Slow gait speed.    Stairs             Wheelchair Mobility    Modified Rankin (Stroke Patients Only)       Balance Overall balance assessment: Needs assistance         Standing balance support: Bilateral upper extremity supported Standing balance-Leahy Scale: Poor                              Cognition  Arousal/Alertness: Awake/alert Behavior During Therapy: WFL for tasks assessed/performed Overall Cognitive Status: Within Functional Limits for tasks assessed                                        Exercises    General Comments        Pertinent Vitals/Pain Pain Assessment: 0-10 Pain Score: 6  Pain Location: L knee Pain Descriptors / Indicators: Aching;Sore;Discomfort Pain Intervention(s): Monitored during session;Repositioned;Ice applied    Home Living Family/patient expects to be discharged to:: Private residence Living Arrangements: Spouse/significant other Available Help at Discharge: Family;Available 24 hours/day Type of Home: House Home Access: Stairs to enter Entrance Stairs-Rails: Right;Left;Can reach both Home Layout: Able to live on main level with bedroom/bathroom Home Equipment: Walker - 2 wheels;Bedside commode;Walker - 4 wheels;Cane - single point;Grab bars - toilet;Grab bars - tub/shower;Hand held shower head;Adaptive equipment      Prior Function Level of Independence: Independent with assistive device(s)      Comments: cane PRN   PT Goals (current goals can now be found in the care plan section) Acute Rehab PT Goals Patient Stated Goal: none stated PT Goal Formulation: With patient/family Time For Goal Achievement: 06/28/18 Potential to Achieve Goals: Good Progress towards PT goals: Progressing toward goals    Frequency    7X/week      PT Plan Current plan remains appropriate    Co-evaluation  AM-PAC PT "6 Clicks" Mobility   Outcome Measure  Help needed turning from your back to your side while in a flat bed without using bedrails?: A Little Help needed moving from lying on your back to sitting on the side of a flat bed without using bedrails?: A Little Help needed moving to and from a bed to a chair (including a wheelchair)?: A Little Help needed standing up from a chair using your arms (e.g., wheelchair or  bedside chair)?: A Little Help needed to walk in hospital room?: A Little Help needed climbing 3-5 steps with a railing? : A Little 6 Click Score: 18    End of Session Equipment Utilized During Treatment: Gait belt;Left knee immobilizer Activity Tolerance: Patient tolerated treatment well Patient left: in chair;with call bell/phone within reach;with family/visitor present   PT Visit Diagnosis: Pain;Other abnormalities of gait and mobility (R26.89) Pain - Right/Left: Left Pain - part of body: Knee     Time: 2641-5830 PT Time Calculation (min) (ACUTE ONLY): 13 min  Charges:  $Gait Training: 8-22 mins                        Weston Anna, [place] Pager: [phone] Office: [phone]

## 2018-06-14 NOTE — Evaluation (Signed)
Physical Therapy Evaluation Patient Details Name: [name] MRN: [id number] DOB: [birth year] Today's Date: 06/14/2018   History of Present Illness  78 yo female s/p L TKA 06/13/18. Hx of multiple lumbar surgeries, neuropathy, DM, spinal cord stimulator.   Clinical Impression  On eval, pt required Min assist for mobility. She walked ~75 feet with a RW. Moderate pain with activity. Will follow and progress activity as tolerated. D/c plan is for home with OP PT f/u.     Follow Up Recommendations Follow surgeon's recommendation for DC plan and follow-up therapies;Supervision for mobility/OOB    Equipment Recommendations  None recommended by PT    Recommendations for Other Services       Precautions / Restrictions Precautions Precautions: Fall;Knee Required Braces or Orthoses: Knee Immobilizer - Left Knee Immobilizer - Left: Discontinue once straight leg raise with < 10 degree lag Restrictions Weight Bearing Restrictions: No Other Position/Activity Restrictions: WBAT      Mobility  Bed Mobility Overal bed mobility: Needs Assistance Bed Mobility: Supine to Sit     Supine to sit: HOB elevated;Min guard     General bed mobility comments: close guard for safety. Increased time. VCs safety, technique.   Transfers Overall transfer level: Needs assistance Equipment used: Rolling walker (2 wheeled) Transfers: Sit to/from Stand Sit to Stand: Min assist;From elevated surface         General transfer comment: Assist to rise, stabilize, control descent. VCs safety, technique, hand/LE placement.   Ambulation/Gait Ambulation/Gait assistance: Min assist Gait Distance (Feet): 75 Feet Assistive device: Rolling walker (2 wheeled) Gait Pattern/deviations: Step-to pattern     General Gait Details: VCs safety, technique, sequence. Intermittent assist to steady. Slow gait speed. Pt c/o some fatigue but no dizziness/lightheadedness  Stairs            Wheelchair Mobility     Modified Rankin (Stroke Patients Only)       Balance Overall balance assessment: Needs assistance         Standing balance support: Bilateral upper extremity supported Standing balance-Leahy Scale: Poor                               Pertinent Vitals/Pain Pain Assessment: 0-10 Pain Score: 7  Pain Location: L knee Pain Descriptors / Indicators: Aching;Sore;Discomfort Pain Intervention(s): Monitored during session;Repositioned;Ice applied    Home Living Family/patient expects to be discharged to:: Private residence Living Arrangements: Spouse/significant other Available Help at Discharge: Family;Available 24 hours/day Type of Home: House Home Access: Stairs to enter Entrance Stairs-Rails: Right;Left;Can reach both Entrance Stairs-Number of Steps: 3 Home Layout: Able to live on main level with bedroom/bathroom Home Equipment: Walker - 2 wheels;Bedside commode;Walker - 4 wheels;Cane - single point;Grab bars - toilet;Grab bars - tub/shower;Hand held shower head;Adaptive equipment      Prior Function Level of Independence: Independent with assistive device(s)         Comments: cane PRN     Hand Dominance        Extremity/Trunk Assessment   Upper Extremity Assessment Upper Extremity Assessment: Overall WFL for tasks assessed    Lower Extremity Assessment Lower Extremity Assessment: Generalized weakness    Cervical / Trunk Assessment Cervical / Trunk Assessment: Normal  Communication   Communication: No difficulties  Cognition Arousal/Alertness: Awake/alert Behavior During Therapy: WFL for tasks assessed/performed Overall Cognitive Status: Within Functional Limits for tasks assessed  General Comments      Exercises Total Joint Exercises Ankle Circles/Pumps: AROM;Both;10 reps;Supine Quad Sets: AROM;Both;10 reps;Supine Heel Slides: AAROM;Left;10 reps;Supine Hip ABduction/ADduction:  AAROM;Left;10 reps;Supine Straight Leg Raises: AAROM;Left;10 reps;Supine Goniometric ROM: ~10-60 degrees   Assessment/Plan    PT Assessment Patient needs continued PT services  PT Problem List Decreased range of motion;Decreased strength;Decreased balance;Decreased mobility;Decreased cognition;Decreased knowledge of precautions;Impaired sensation       PT Treatment Interventions Gait training;Functional mobility training;Therapeutic activities;Balance training;Patient/family education;DME instruction;Stair training;Therapeutic exercise    PT Goals (Current goals can be found in the Care Plan section)  Acute Rehab PT Goals Patient Stated Goal: none stated PT Goal Formulation: With patient/family Time For Goal Achievement: 06/28/18 Potential to Achieve Goals: Good    Frequency 7X/week   Barriers to discharge        Co-evaluation               AM-PAC PT "6 Clicks" Mobility  Outcome Measure Help needed turning from your back to your side while in a flat bed without using bedrails?: A Little Help needed moving from lying on your back to sitting on the side of a flat bed without using bedrails?: A Little Help needed moving to and from a bed to a chair (including a wheelchair)?: A Little Help needed standing up from a chair using your arms (e.g., wheelchair or bedside chair)?: A Little Help needed to walk in hospital room?: A Little Help needed climbing 3-5 steps with a railing? : A Little 6 Click Score: 18    End of Session Equipment Utilized During Treatment: Gait belt;Left knee immobilizer Activity Tolerance: Patient tolerated treatment well Patient left: in chair;with call bell/phone within reach;with family/visitor present   PT Visit Diagnosis: Pain;Other abnormalities of gait and mobility (R26.89) Pain - Right/Left: Left Pain - part of body: Knee    Time: 0935-1003 PT Time Calculation (min) (ACUTE ONLY): 28 min   Charges:   PT Evaluation $PT Eval Low  Complexity: 1 Low PT Treatments $Gait Training: 8-22 mins         Weston Anna, PT Acute Rehabilitation Services Pager: [phone] Office: [phone]

## 2018-06-14 NOTE — Care Management Note (Signed)
Case Management Note  Patient Details  Name: [name] MRN: [id number] Date of Birth: Aug 22, 1940  Subjective/Objective:   Spoke with patient at bedside. Confirmed plan for OP PT, already arranged. Has RW and 3n1. [phone]                 Action/Plan:   Expected Discharge Date:  06/14/18               Expected Discharge Plan:  OP Rehab  In-House Referral:  NA  Discharge planning Services  CM Consult  Post Acute Care Choice:  NA Choice offered to:  Patient  DME Arranged:  N/A DME Agency:  NA  HH Arranged:  NA HH Agency:  NA  Status of Service:  Completed, signed off  If discussed at [place] of Stay Meetings, dates discussed:    Additional Comments:  Guadalupe Maple, RN 06/14/2018, 11:40 AM

## 2018-06-14 NOTE — Progress Notes (Signed)
   Subjective: 1 Day Post-Op Procedure(s) (LRB): TOTAL KNEE ARTHROPLASTY (Left) Patient reports pain as moderate.   Patient seen in rounds by Dr. Wynelle Link. Patient is well, and has had no acute complaints or problems other than pain in the left knee. No issues overnight. Denies chest pain, SOB, or calf pain. Foley catheter removed this AM. We will start therapy today.   Objective: Vital signs in last 24 hours: Temp:  [97.4 F (36.3 C)-98.1 F (36.7 C)] 97.6 F (36.4 C) (02/04 0532) Pulse Rate:  [63-82] 75 (02/04 0532) Resp:  [9-18] 17 (02/04 0532) BP: (116-149)/(63-100) 116/63 (02/04 0532) SpO2:  [99 %-100 %] 99 % (02/04 0532) Weight:  [88.1 kg] 88.1 kg (02/03 1217)  Intake/Output from previous day:  Intake/Output Summary (Last 24 hours) at 06/14/2018 0726 Last data filed at 06/14/2018 0600 Gross per 24 hour  Intake 3080 ml  Output 1615 ml  Net 1465 ml    Labs: Recent Labs    06/14/18 0521  HGB 10.5*   Recent Labs    06/14/18 0521  WBC 12.9*  RBC 4.02  HCT 33.8*  PLT 225   Recent Labs    06/14/18 0521  NA 136  K 4.5  CL 108  CO2 22  BUN 24*  CREATININE 1.07*  GLUCOSE 272*  CALCIUM 8.8*   Exam: General - Patient is Alert and Oriented Extremity - Neurologically intact Neurovascular intact Sensation intact distally Dorsiflexion/Plantar flexion intact Dressing - dressing C/D/I Motor Function - intact, moving foot and toes well on exam.   Past Medical History:  Diagnosis Date  . Anemia   . Arthritis   . Battery end of life of spinal cord stimulator 2016   battery replaced.   Marland Kitchen COPD (chronic obstructive pulmonary disease) ([place])    'Mild' per pt report  . Diabetes mellitus   . DVT (deep venous thrombosis) ([place])   . Dyspnea    on exertion  . GERD (gastroesophageal reflux disease)   . History of kidney stones   . History of stomach ulcers   . Hypertension    dr Marlou Porch  . Neuropathy   . Rapid heartbeat   . Sleep apnea    borderline study; could  use a CPAP if desired.     Assessment/Plan: 1 Day Post-Op Procedure(s) (LRB): TOTAL KNEE ARTHROPLASTY (Left) Principal Problem:   OA (osteoarthritis) of knee  Estimated body mass index is 32.32 kg/m as calculated from the following:   Height as of this encounter: 5\' 5"  (1.651 m).   Weight as of this encounter: 88.1 kg. Advance diet Up with therapy  Anticipated LOS equal to or greater than 2 midnights due to - Age 72 and older with one or more of the following:  - Obesity  - Expected need for hospital services (PT, OT, Nursing) required for safe  discharge  - Anticipated need for postoperative skilled nursing care or inpatient rehab  - Active co-morbidities: Diabetes, DVT/VTE and Respiratory Failure/COPD OR   - Unanticipated findings during/Post Surgery: None  - Patient is a high risk of re-admission due to: None    DVT Prophylaxis - Xarelto Weight bearing as tolerated. D/C O2 and pulse ox and try on room air. Hemovac pulled without difficulty, will begin therapy today.  Plan is to go Home after hospital stay. Plan for discharge tomorrow if progresses with therapy and is meeting her goals.   Theresa Duty, PA-C Orthopedic Surgery 06/14/2018, 7:26 AM

## 2018-06-15 LAB — BASIC METABOLIC PANEL
Anion gap: 7 (ref 5–15)
BUN: 26 mg/dL — ABNORMAL HIGH (ref 8–23)
CO2: 22 mmol/L (ref 22–32)
Calcium: 8.8 mg/dL — ABNORMAL LOW (ref 8.9–10.3)
Chloride: 109 mmol/L (ref 98–111)
Creatinine, Ser: 1.15 mg/dL — ABNORMAL HIGH (ref 0.44–1.00)
GFR calc Af Amer: 54 mL/min — ABNORMAL LOW (ref >60)
GFR calc non Af Amer: 46 mL/min — ABNORMAL LOW (ref >60)
Glucose, Bld: 149 mg/dL — ABNORMAL HIGH (ref 70–99)
Potassium: 4.4 mmol/L (ref 3.5–5.1)
Sodium: 138 mmol/L (ref 135–145)

## 2018-06-15 LAB — GLUCOSE, CAPILLARY: Glucose-Capillary: 125 mg/dL — ABNORMAL HIGH (ref 70–99)

## 2018-06-15 LAB — CBC
HCT: 31.6 % — ABNORMAL LOW (ref 36.0–46.0)
Hemoglobin: 9.8 g/dL — ABNORMAL LOW (ref 12.0–15.0)
MCH: 26.2 pg (ref 26.0–34.0)
MCHC: 31 g/dL (ref 30.0–36.0)
MCV: 84.5 fL (ref 80.0–100.0)
Platelets: 205 10*3/uL (ref 150–400)
RBC: 3.74 MIL/uL — ABNORMAL LOW (ref 3.87–5.11)
RDW: 14.2 % (ref 11.5–15.5)
WBC: 12 10*3/uL — ABNORMAL HIGH (ref 4.0–10.5)
nRBC: 0 % (ref 0.0–0.2)

## 2018-06-15 MED ORDER — METHOCARBAMOL 500 MG PO TABS: 500.0000 mg | ORAL_TABLET | Freq: Four times a day (QID) | ORAL | 0 refills | Status: DC | PRN

## 2018-06-15 MED ORDER — TRAMADOL HCL 50 MG PO TABS: 50.0000 mg | ORAL_TABLET | Freq: Four times a day (QID) | ORAL | 0 refills | Status: DC | PRN

## 2018-06-15 MED ORDER — OXYCODONE HCL 5 MG PO TABS: 5.0000 mg | ORAL_TABLET | Freq: Four times a day (QID) | ORAL | 0 refills | Status: DC | PRN

## 2018-06-15 MED ORDER — RIVAROXABAN 10 MG PO TABS: 10.0000 mg | ORAL_TABLET | Freq: Every day | ORAL | 0 refills | Status: DC

## 2018-06-15 NOTE — Discharge Summary (Signed)
Physician Discharge Summary   Patient ID: [name] MRN: [id number] DOB/AGE: 08-24-1940 78 y.o.  Admit date: 06/13/2018 Discharge date: 06/15/2018  Primary Diagnosis: Osteoarthritis, left knee  Admission Diagnoses:  Past Medical History:  Diagnosis Date  . Anemia   . Arthritis   . Battery end of life of spinal cord stimulator 2016   battery replaced.   Marland Kitchen COPD (chronic obstructive pulmonary disease) ([place])    'Mild' per pt report  . Diabetes mellitus   . DVT (deep venous thrombosis) ([place])   . Dyspnea    on exertion  . GERD (gastroesophageal reflux disease)   . History of kidney stones   . History of stomach ulcers   . Hypertension    dr Marlou Porch  . Neuropathy   . Rapid heartbeat   . Sleep apnea    borderline study; could use a CPAP if desired.    Discharge Diagnoses:   Principal Problem:   OA (osteoarthritis) of knee  Estimated body mass index is 32.32 kg/m as calculated from the following:   Height as of this encounter: 5\' 5"  (1.651 m).   Weight as of this encounter: 88.1 kg.  Procedure:  Procedure(s) (LRB): TOTAL KNEE ARTHROPLASTY (Left)   Consults: None  HPI:  [name] is a 78 y.o. year old female with end stage OA of her left knee with progressively worsening pain and dysfunction. She has constant pain, with activity and at rest and significant functional deficits with difficulties even with ADLs. She has had extensive non-op management including analgesics, injections of cortisone and viscosupplements, and home exercise program, but remains in significant pain with significant dysfunction. Radiographs show bone on bone arthritis lateral and patellofemoral. She presents now for left Total Knee Arthroplasty.    Laboratory Data: Admission on 06/13/2018, Discharged on 06/15/2018  Component Date Value Ref Range Status  . Glucose-Capillary 06/13/2018 130* 70 - 99 mg/dL Final  . Glucose-Capillary 06/13/2018 149* 70 - 99 mg/dL Final  . Glucose-Capillary  06/13/2018 116* 70 - 99 mg/dL Final  . WBC 06/14/2018 12.9* 4.0 - 10.5 K/uL Final  . RBC 06/14/2018 4.02  3.87 - 5.11 MIL/uL Final  . Hemoglobin 06/14/2018 10.5* 12.0 - 15.0 g/dL Final  . HCT 06/14/2018 33.8* 36.0 - 46.0 % Final  . MCV 06/14/2018 84.1  80.0 - 100.0 fL Final  . MCH 06/14/2018 26.1  26.0 - 34.0 pg Final  . MCHC 06/14/2018 31.1  30.0 - 36.0 g/dL Final  . RDW 06/14/2018 14.1  11.5 - 15.5 % Final  . Platelets 06/14/2018 225  150 - 400 K/uL Final  . nRBC 06/14/2018 0.0  0.0 - 0.2 % Final   Performed at [facility], [place] [address], [place], [place] [address]  . Sodium 06/14/2018 136  135 - 145 mmol/L Final  . Potassium 06/14/2018 4.5  3.5 - 5.1 mmol/L Final  . Chloride 06/14/2018 108  98 - 111 mmol/L Final  . CO2 06/14/2018 22  22 - 32 mmol/L Final  . Glucose, Bld 06/14/2018 272* 70 - 99 mg/dL Final  . BUN 06/14/2018 24* 8 - 23 mg/dL Final  . Creatinine, Ser 06/14/2018 1.07* 0.44 - 1.00 mg/dL Final  . Calcium 06/14/2018 8.8* 8.9 - 10.3 mg/dL Final  . GFR calc non Af Amer 06/14/2018 50* >60 mL/min Final  . GFR calc Af Amer 06/14/2018 58* >60 mL/min Final  . Anion gap 06/14/2018 6  5 - 15 Final   Performed at [facility], [address] [place], [place] [address]  . Glucose-Capillary 06/13/2018 232* 70 - 99 mg/dL Final  . Glucose-Capillary 06/14/2018 177* 70 - 99 mg/dL Final  . WBC 06/15/2018 12.0* 4.0 - 10.5 K/uL Final  . RBC 06/15/2018 3.74* 3.87 - 5.11 MIL/uL Final  . Hemoglobin 06/15/2018 9.8* 12.0 - 15.0 g/dL Final  . HCT 06/15/2018 31.6* 36.0 - 46.0 % Final  . MCV 06/15/2018 84.5  80.0 - 100.0 fL Final  . MCH 06/15/2018 26.2  26.0 - 34.0 pg Final  . MCHC 06/15/2018 31.0  30.0 - 36.0 g/dL Final  . RDW 06/15/2018 14.2  11.5 - 15.5 % Final  . Platelets 06/15/2018 205  150 - 400 K/uL Final  . nRBC 06/15/2018 0.0  0.0 - 0.2 % Final   Performed at [facility], [place] [address] [place], [place] [address]  . Sodium  06/15/2018 138  135 - 145 mmol/L Final  . Potassium 06/15/2018 4.4  3.5 - 5.1 mmol/L Final  . Chloride 06/15/2018 109  98 - 111 mmol/L Final  . CO2 06/15/2018 22  22 - 32 mmol/L Final  . Glucose, Bld 06/15/2018 149* 70 - 99 mg/dL Final  . BUN 06/15/2018 26* 8 - 23 mg/dL Final  . Creatinine, Ser 06/15/2018 1.15* 0.44 - 1.00 mg/dL Final  . Calcium 06/15/2018 8.8* 8.9 - 10.3 mg/dL Final  . GFR calc non Af Amer 06/15/2018 46* >60 mL/min Final  . GFR calc Af Amer 06/15/2018 54* >60 mL/min Final  . Anion gap 06/15/2018 7  5 - 15 Final   Performed at [facility], [place] [address], [place], [place] [address]  . Glucose-Capillary 06/14/2018 217* 70 - 99 mg/dL Final  . Glucose-Capillary 06/14/2018 211* 70 - 99 mg/dL Final  . Glucose-Capillary 06/14/2018 162* 70 - 99 mg/dL Final  . Glucose-Capillary 06/15/2018 125* 70 - 99 mg/dL Final  Hospital Outpatient Visit on 06/06/2018  Component Date Value Ref Range Status  . aPTT 06/06/2018 26  24 - 36 seconds Final   Performed at [facility], [place] [address] [place], [place] [address]  . WBC 06/06/2018 10.8* 4.0 - 10.5 K/uL Final  . RBC 06/06/2018 4.89  3.87 - 5.11 MIL/uL Final  . Hemoglobin 06/06/2018 12.7  12.0 - 15.0 g/dL Final  . HCT 06/06/2018 41.1  36.0 - 46.0 % Final  . MCV 06/06/2018 84.0  80.0 - 100.0 fL Final  . MCH 06/06/2018 26.0  26.0 - 34.0 pg Final  . MCHC 06/06/2018 30.9  30.0 - 36.0 g/dL Final  . RDW 06/06/2018 13.9  11.5 - 15.5 % Final  . Platelets 06/06/2018 275  150 - 400 K/uL Final  . nRBC 06/06/2018 0.0  0.0 - 0.2 % Final   Performed at [facility], [place] [address] [place], [place] [address]  . Sodium 06/06/2018 140  135 - 145 mmol/L Final  . Potassium 06/06/2018 4.6  3.5 - 5.1 mmol/L Final  . Chloride 06/06/2018 109  98 - 111 mmol/L Final  . CO2 06/06/2018 25  22 - 32 mmol/L Final  . Glucose, Bld 06/06/2018 144* 70 - 99 mg/dL Final  . BUN 06/06/2018 39* 8 - 23 mg/dL  Final  . Creatinine, Ser 06/06/2018 1.08* 0.44 - 1.00 mg/dL Final  . Calcium 06/06/2018 9.4  8.9 - 10.3 mg/dL Final  . Total Protein 06/06/2018 7.1  6.5 - 8.1 g/dL Final  . Albumin 06/06/2018 3.9  3.5 - 5.0 g/dL Final  . AST  06/06/2018 18  15 - 41 U/L Final  . ALT 06/06/2018 15  0 - 44 U/L Final  . Alkaline Phosphatase 06/06/2018 61  38 - 126 U/L Final  . Total Bilirubin 06/06/2018 0.8  0.3 - 1.2 mg/dL Final  . GFR calc non Af Amer 06/06/2018 50* >60 mL/min Final  . GFR calc Af Amer 06/06/2018 58* >60 mL/min Final  . Anion gap 06/06/2018 6  5 - 15 Final   Performed at [facility], [place] [address], [place], [place] [address]  . Prothrombin Time 06/06/2018 12.8  11.4 - 15.2 seconds Final  . INR 06/06/2018 0.97   Final   Performed at [place] [address] [place], [place] [address]  . ABO/RH(D) 06/06/2018 B POS   Final  . Antibody Screen 06/06/2018 NEG   Final  . Sample Expiration 06/06/2018 06/16/2018   Final  . Extend sample reason 06/06/2018    Final                   Value:NO TRANSFUSIONS OR PREGNANCY IN THE PAST 3 MONTHS Performed at [facility], [place] [address], [place], [place] [address]   . Hgb A1c MFr Bld 06/06/2018 7.2* 4.8 - 5.6 % Final   Comment: (NOTE) Pre diabetes:          5.7%-6.4% Diabetes:              >6.4% Glycemic control for   <7.0% adults with diabetes   . Mean Plasma Glucose 06/06/2018 159.94  mg/dL Final   Performed at [facility], [place] [address] [place], [place] [address]  . MRSA, PCR 06/06/2018 NEGATIVE  NEGATIVE Final  . Staphylococcus aureus 06/06/2018 NEGATIVE  NEGATIVE Final   Comment: (NOTE) The Xpert SA Assay (FDA approved for NASAL specimens in patients 66 years of age and older), is one component of a comprehensive surveillance program. It is not intended to diagnose infection nor to guide or monitor treatment. Performed at [facility], [place] [address], [place], [place] [address]   . Glucose-Capillary 06/06/2018 171* 70 - 99 mg/dL Final     X-Rays:No results found.  EKG: Orders placed or performed in visit on 01/28/18  . EKG 12-Lead     Hospital Course: [name] is a 78 y.o. who was admitted to [facility]. They were brought to the operating room on 06/13/2018 and underwent Procedure(s): TOTAL KNEE ARTHROPLASTY.  Patient tolerated the procedure well and was later transferred to the recovery room and then to the orthopaedic floor for postoperative care. They were given PO and IV analgesics for pain control following their surgery. They were given 24 hours of postoperative antibiotics of  Anti-infectives (From admission, onward)   Start     Dose/Rate Route Frequency Ordered Stop   06/13/18 2000  ceFAZolin (ANCEF) IVPB 2g/100 mL premix     2 g 200 mL/hr over 30 Minutes Intravenous Every 6 hours 06/13/18 1650 06/14/18 0141   06/13/18 1145  ceFAZolin (ANCEF) IVPB 2g/100 mL premix     2 g 200 mL/hr over 30 Minutes Intravenous On call to O.R. 06/13/18 1131 06/13/18 1457     and started on DVT prophylaxis in the form of Xarelto.   PT and OT were ordered for total joint protocol. Discharge planning consulted to help with postop disposition and equipment needs. Patient had a good night on the evening of surgery. They started to get up OOB  with therapy on POD #1. Hemovac drain was pulled without difficulty on day one. Continued to work with therapy into POD #2. Pt was seen during rounds on day two and was ready to go home pending progress with therapy. Dressing was changed and the incision was clean and dry. Pt worked with therapy for one additional session and was meeting their goals. She was discharged to home later that day in stable condition.  Diet: Diabetic diet Activity: WBAT Follow-up: in 2 weeks Disposition: Home Discharged Condition: good   Discharge Instructions    Call MD / Call 911   Complete by:  As directed     If you experience chest pain or shortness of breath, CALL 911 and be transported to the hospital emergency room.  If you develope a fever above 101 F, pus (white drainage) or increased drainage or redness at the wound, or calf pain, call your surgeon's office.   Call MD / Call 911   Complete by:  As directed    If you experience chest pain or shortness of breath, CALL 911 and be transported to the hospital emergency room.  If you develope a fever above 101 F, pus (white drainage) or increased drainage or redness at the wound, or calf pain, call your surgeon's office.   Change dressing   Complete by:  As directed    Change the dressing daily with sterile 4 x 4 inch gauze dressing and apply TED hose.   Change dressing   Complete by:  As directed    Change dressing on Thursday, then change the dressing daily with sterile 4 x 4 inch gauze dressing and apply TED hose.   Constipation Prevention   Complete by:  As directed    Drink plenty of fluids.  Prune juice may be helpful.  You may use a stool softener, such as Colace (over the counter) 100 mg twice a day.  Use MiraLax (over the counter) for constipation as needed.   Constipation Prevention   Complete by:  As directed    Drink plenty of fluids.  Prune juice may be helpful.  You may use a stool softener, such as Colace (over the counter) 100 mg twice a day.  Use MiraLax (over the counter) for constipation as needed.   Diet - low sodium heart healthy   Complete by:  As directed    Diet - low sodium heart healthy   Complete by:  As directed    Discharge instructions   Complete by:  As directed    [name] Total Joint Specialist [facility] [address] [place], [place] [address] [phone]  TOTAL KNEE REPLACEMENT POSTOPERATIVE DIRECTIONS  Knee Rehabilitation, Guidelines Following Surgery  Results after knee surgery are often greatly improved when you follow the exercise, range of motion and muscle strengthening  exercises prescribed by your doctor. Safety measures are also important to protect the knee from further injury. Any time any of these exercises cause you to have increased pain or swelling in your knee joint, decrease the amount until you are comfortable again and slowly increase them. If you have problems or questions, call your caregiver or physical therapist for advice.   HOME CARE INSTRUCTIONS  Remove items at home which could result in a fall. This includes throw rugs or furniture in walking pathways.  ICE to the affected knee every three hours for 30 minutes at a time and then as needed for pain and swelling.  Continue to use  ice on the knee for pain and swelling from surgery. You may notice swelling that will progress down to the foot and ankle.  This is normal after surgery.  Elevate the leg when you are not up walking on it.   Continue to use the breathing machine which will help keep your temperature down.  It is common for your temperature to cycle up and down following surgery, especially at night when you are not up moving around and exerting yourself.  The breathing machine keeps your lungs expanded and your temperature down. Do not place pillow under knee, focus on keeping the knee straight while resting   DIET You may resume your previous home diet once your are discharged from the hospital.  DRESSING / WOUND CARE / SHOWERING You may shower 3 days after surgery, but keep the wounds dry during showering.  You may use an occlusive plastic wrap (Press'n Seal for example), NO SOAKING/SUBMERGING IN THE BATHTUB.  If the bandage gets wet, change with a clean dry gauze.  If the incision gets wet, pat the wound dry with a clean towel. You may start showering once you are discharged home but do not submerge the incision under water. Just pat the incision dry and apply a dry gauze dressing on daily. Change the surgical dressing daily and reapply a dry dressing each time.  ACTIVITY Walk with  your walker as instructed. Use walker as long as suggested by your caregivers. Avoid periods of inactivity such as sitting longer than an hour when not asleep. This helps prevent blood clots.  You may resume a sexual relationship in one month or when given the OK by your doctor.  You may return to work once you are cleared by your doctor.  Do not drive a car for 6 weeks or until released by you surgeon.  Do not drive while taking narcotics.  WEIGHT BEARING Weight bearing as tolerated with assist device (walker, cane, etc) as directed, use it as long as suggested by your surgeon or therapist, typically at least 4-6 weeks.  POSTOPERATIVE CONSTIPATION PROTOCOL Constipation - defined medically as fewer than three stools per week and severe constipation as less than one stool per week.  One of the most common issues patients have following surgery is constipation.  Even if you have a regular bowel pattern at home, your normal regimen is likely to be disrupted due to multiple reasons following surgery.  Combination of anesthesia, postoperative narcotics, change in appetite and fluid intake all can affect your bowels.  In order to avoid complications following surgery, here are some recommendations in order to help you during your recovery period.  Colace (docusate) - Pick up an over-the-counter form of Colace or another stool softener and take twice a day as long as you are requiring postoperative pain medications.  Take with a full glass of water daily.  If you experience loose stools or diarrhea, hold the colace until you stool forms back up.  If your symptoms do not get better within 1 week or if they get worse, check with your doctor.  Dulcolax (bisacodyl) - Pick up over-the-counter and take as directed by the product packaging as needed to assist with the movement of your bowels.  Take with a full glass of water.  Use this product as needed if not relieved by Colace only.   MiraLax (polyethylene  glycol) - Pick up over-the-counter to have on hand.  MiraLax is a solution that will increase the amount of water  in your bowels to assist with bowel movements.  Take as directed and can mix with a glass of water, juice, soda, coffee, or tea.  Take if you go more than two days without a movement. Do not use MiraLax more than once per day. Call your doctor if you are still constipated or irregular after using this medication for 7 days in a row.  If you continue to have problems with postoperative constipation, please contact the office for further assistance and recommendations.  If you experience "the worst abdominal pain ever" or develop nausea or vomiting, please contact the office immediatly for further recommendations for treatment.  ITCHING  If you experience itching with your medications, try taking only a single pain pill, or even half a pain pill at a time.  You can also use Benadryl over the counter for itching or also to help with sleep.   TED HOSE STOCKINGS Wear the elastic stockings on both legs for three weeks following surgery during the day but you may remove then at night for sleeping.  MEDICATIONS See your medication summary on the "After Visit Summary" that the nursing staff will review with you prior to discharge.  You may have some home medications which will be placed on hold until you complete the course of blood thinner medication.  It is important for you to complete the blood thinner medication as prescribed by your surgeon.  Continue your approved medications as instructed at time of discharge.  PRECAUTIONS If you experience chest pain or shortness of breath - call 911 immediately for transfer to the hospital emergency department.  If you develop a fever greater that 101 F, purulent drainage from wound, increased redness or drainage from wound, foul odor from the wound/dressing, or calf pain - CONTACT YOUR SURGEON.                                                     FOLLOW-UP APPOINTMENTS Make sure you keep all of your appointments after your operation with your surgeon and caregivers. You should call the office at the above phone number and make an appointment for approximately two weeks after the date of your surgery or on the date instructed by your surgeon outlined in the "After Visit Summary".   RANGE OF MOTION AND STRENGTHENING EXERCISES  Rehabilitation of the knee is important following a knee injury or an operation. After just a few days of immobilization, the muscles of the thigh which control the knee become weakened and shrink (atrophy). Knee exercises are designed to build up the tone and strength of the thigh muscles and to improve knee motion. Often times heat used for twenty to thirty minutes before working out will loosen up your tissues and help with improving the range of motion but do not use heat for the first two weeks following surgery. These exercises can be done on a training (exercise) mat, on the floor, on a table or on a bed. Use what ever works the best and is most comfortable for you Knee exercises include:  Leg Lifts - While your knee is still immobilized in a splint or cast, you can do straight leg raises. Lift the leg to 60 degrees, hold for 3 sec, and slowly lower the leg. Repeat 10-20 times 2-3 times daily. Perform this exercise against resistance later as your  knee gets better.  Quad and Hamstring Sets - Tighten up the muscle on the front of the thigh (Quad) and hold for 5-10 sec. Repeat this 10-20 times hourly. Hamstring sets are done by pushing the foot backward against an object and holding for 5-10 sec. Repeat as with quad sets.  Leg Slides: Lying on your back, slowly slide your foot toward your buttocks, bending your knee up off the floor (only go as far as is comfortable). Then slowly slide your foot back down until your leg is flat on the floor again. Angel Wings: Lying on your back spread your legs to the side as far apart  as you can without causing discomfort.  A rehabilitation program following serious knee injuries can speed recovery and prevent re-injury in the future due to weakened muscles. Contact your doctor or a physical therapist for more information on knee rehabilitation.   IF YOU ARE TRANSFERRED TO A SKILLED REHAB FACILITY If the patient is transferred to a skilled rehab facility following release from the hospital, a list of the current medications will be sent to the facility for the patient to continue.  When discharged from the skilled rehab facility, please have the facility set up the patient's [place] prior to being released. Also, the skilled facility will be responsible for providing the patient with their medications at time of release from the facility to include their pain medication, the muscle relaxants, and their blood thinner medication. If the patient is still at the rehab facility at time of the two week follow up appointment, the skilled rehab facility will also need to assist the patient in arranging follow up appointment in our office and any transportation needs.  MAKE SURE YOU:  Understand these instructions.  Get help right away if you are not doing well or get worse.    Pick up stool softner and laxative for home use following surgery while on pain medications. Do not submerge incision under water. Please use good hand washing techniques while changing dressing each day. May shower starting three days after surgery. Please use a clean towel to pat the incision dry following showers. Continue to use ice for pain and swelling after surgery. Do not use any lotions or creams on the incision until instructed by your surgeon.   Discharge instructions   Complete by:  As directed    [name] Total Joint Specialist [facility] [address] [place], [place] [address] [phone]  TOTAL KNEE REPLACEMENT POSTOPERATIVE DIRECTIONS  Knee  Rehabilitation, Guidelines Following Surgery  Results after knee surgery are often greatly improved when you follow the exercise, range of motion and muscle strengthening exercises prescribed by your doctor. Safety measures are also important to protect the knee from further injury. Any time any of these exercises cause you to have increased pain or swelling in your knee joint, decrease the amount until you are comfortable again and slowly increase them. If you have problems or questions, call your caregiver or physical therapist for advice.   HOME CARE INSTRUCTIONS  Remove items at home which could result in a fall. This includes throw rugs or furniture in walking pathways.  ICE to the affected knee every three hours for 30 minutes at a time and then as needed for pain and swelling.  Continue to use ice on the knee for pain and swelling from surgery. You may notice swelling that will progress down to the foot and ankle.  This is normal  after surgery.  Elevate the leg when you are not up walking on it.   Continue to use the breathing machine which will help keep your temperature down.  It is common for your temperature to cycle up and down following surgery, especially at night when you are not up moving around and exerting yourself.  The breathing machine keeps your lungs expanded and your temperature down. Do not place pillow under knee, focus on keeping the knee straight while resting   DIET You may resume your previous home diet once your are discharged from the hospital.  DRESSING / WOUND CARE / SHOWERING You may shower 3 days after surgery, but keep the wounds dry during showering.  You may use an occlusive plastic wrap (Press'n Seal for example), NO SOAKING/SUBMERGING IN THE BATHTUB.  If the bandage gets wet, change with a clean dry gauze.  If the incision gets wet, pat the wound dry with a clean towel. You may start showering once you are discharged home but do not submerge the incision under  water. Just pat the incision dry and apply a dry gauze dressing on daily. Change the surgical dressing daily and reapply a dry dressing each time.  ACTIVITY Walk with your walker as instructed. Use walker as long as suggested by your caregivers. Avoid periods of inactivity such as sitting longer than an hour when not asleep. This helps prevent blood clots.  You may resume a sexual relationship in one month or when given the OK by your doctor.  You may return to work once you are cleared by your doctor.  Do not drive a car for 6 weeks or until released by you surgeon.  Do not drive while taking narcotics.  WEIGHT BEARING Weight bearing as tolerated with assist device (walker, cane, etc) as directed, use it as long as suggested by your surgeon or therapist, typically at least 4-6 weeks.  POSTOPERATIVE CONSTIPATION PROTOCOL Constipation - defined medically as fewer than three stools per week and severe constipation as less than one stool per week.  One of the most common issues patients have following surgery is constipation.  Even if you have a regular bowel pattern at home, your normal regimen is likely to be disrupted due to multiple reasons following surgery.  Combination of anesthesia, postoperative narcotics, change in appetite and fluid intake all can affect your bowels.  In order to avoid complications following surgery, here are some recommendations in order to help you during your recovery period.  Colace (docusate) - Pick up an over-the-counter form of Colace or another stool softener and take twice a day as long as you are requiring postoperative pain medications.  Take with a full glass of water daily.  If you experience loose stools or diarrhea, hold the colace until you stool forms back up.  If your symptoms do not get better within 1 week or if they get worse, check with your doctor.  Dulcolax (bisacodyl) - Pick up over-the-counter and take as directed by the product packaging as  needed to assist with the movement of your bowels.  Take with a full glass of water.  Use this product as needed if not relieved by Colace only.   MiraLax (polyethylene glycol) - Pick up over-the-counter to have on hand.  MiraLax is a solution that will increase the amount of water in your bowels to assist with bowel movements.  Take as directed and can mix with a glass of water, juice, soda, coffee, or tea.  Take  if you go more than two days without a movement. Do not use MiraLax more than once per day. Call your doctor if you are still constipated or irregular after using this medication for 7 days in a row.  If you continue to have problems with postoperative constipation, please contact the office for further assistance and recommendations.  If you experience "the worst abdominal pain ever" or develop nausea or vomiting, please contact the office immediatly for further recommendations for treatment.  ITCHING  If you experience itching with your medications, try taking only a single pain pill, or even half a pain pill at a time.  You can also use Benadryl over the counter for itching or also to help with sleep.   TED HOSE STOCKINGS Wear the elastic stockings on both legs for three weeks following surgery during the day but you may remove then at night for sleeping.  MEDICATIONS See your medication summary on the "After Visit Summary" that the nursing staff will review with you prior to discharge.  You may have some home medications which will be placed on hold until you complete the course of blood thinner medication.  It is important for you to complete the blood thinner medication as prescribed by your surgeon.  Continue your approved medications as instructed at time of discharge.  PRECAUTIONS If you experience chest pain or shortness of breath - call 911 immediately for transfer to the hospital emergency department.  If you develop a fever greater that 101 F, purulent drainage from wound,  increased redness or drainage from wound, foul odor from the wound/dressing, or calf pain - CONTACT YOUR SURGEON.                                                   FOLLOW-UP APPOINTMENTS Make sure you keep all of your appointments after your operation with your surgeon and caregivers. You should call the office at the above phone number and make an appointment for approximately two weeks after the date of your surgery or on the date instructed by your surgeon outlined in the "After Visit Summary".   RANGE OF MOTION AND STRENGTHENING EXERCISES  Rehabilitation of the knee is important following a knee injury or an operation. After just a few days of immobilization, the muscles of the thigh which control the knee become weakened and shrink (atrophy). Knee exercises are designed to build up the tone and strength of the thigh muscles and to improve knee motion. Often times heat used for twenty to thirty minutes before working out will loosen up your tissues and help with improving the range of motion but do not use heat for the first two weeks following surgery. These exercises can be done on a training (exercise) mat, on the floor, on a table or on a bed. Use what ever works the best and is most comfortable for you Knee exercises include:  Leg Lifts - While your knee is still immobilized in a splint or cast, you can do straight leg raises. Lift the leg to 60 degrees, hold for 3 sec, and slowly lower the leg. Repeat 10-20 times 2-3 times daily. Perform this exercise against resistance later as your knee gets better.  Quad and Hamstring Sets - Tighten up the muscle on the front of the thigh (Quad) and hold for 5-10 sec. Repeat this 10-20  times hourly. Hamstring sets are done by pushing the foot backward against an object and holding for 5-10 sec. Repeat as with quad sets.  Leg Slides: Lying on your back, slowly slide your foot toward your buttocks, bending your knee up off the floor (only go as far as is  comfortable). Then slowly slide your foot back down until your leg is flat on the floor again. Angel Wings: Lying on your back spread your legs to the side as far apart as you can without causing discomfort.  A rehabilitation program following serious knee injuries can speed recovery and prevent re-injury in the future due to weakened muscles. Contact your doctor or a physical therapist for more information on knee rehabilitation.   IF YOU ARE TRANSFERRED TO A SKILLED REHAB FACILITY If the patient is transferred to a skilled rehab facility following release from the hospital, a list of the current medications will be sent to the facility for the patient to continue.  When discharged from the skilled rehab facility, please have the facility set up the patient's [place] prior to being released. Also, the skilled facility will be responsible for providing the patient with their medications at time of release from the facility to include their pain medication, the muscle relaxants, and their blood thinner medication. If the patient is still at the rehab facility at time of the two week follow up appointment, the skilled rehab facility will also need to assist the patient in arranging follow up appointment in our office and any transportation needs.  MAKE SURE YOU:  Understand these instructions.  Get help right away if you are not doing well or get worse.    Pick up stool softner and laxative for home use following surgery while on pain medications. Do not submerge incision under water. Please use good hand washing techniques while changing dressing each day. May shower starting three days after surgery. Please use a clean towel to pat the incision dry following showers. Continue to use ice for pain and swelling after surgery. Do not use any lotions or creams on the incision until instructed by your surgeon.   Do not put a pillow under the knee. Place it under the heel.   Complete  by:  As directed    Do not put a pillow under the knee. Place it under the heel.   Complete by:  As directed    Driving restrictions   Complete by:  As directed    No driving for two weeks   Driving restrictions   Complete by:  As directed    No driving for two weeks   TED hose   Complete by:  As directed    Use stockings (TED hose) for three weeks on both leg(s).  You may remove them at night for sleeping.   TED hose   Complete by:  As directed    Use stockings (TED hose) for three weeks on both leg(s).  You may remove them at night for sleeping.   Weight bearing as tolerated   Complete by:  As directed    Weight bearing as tolerated   Complete by:  As directed      Allergies as of 06/15/2018      Reactions   Hydrochlorothiazide Anaphylaxis, Other (See Comments)   comatose   Ace Inhibitors Cough   Ezetimibe-simvastatin Other (See Comments)   MYALGIA   Indomethacin Other (See Comments)   PEPTIC ULCERS   Lisinopril Cough  Lyrica [pregabalin] Other (See Comments)   Weight gain, drowsy   Prednisone Itching, Swelling, Other (See Comments)   Facial redness/swelling    Simvastatin Other (See Comments)   Elevated LFT's   Vioxx [rofecoxib] Other (See Comments)   Anxiety & chills   Troglitazone Other (See Comments)   Pt is unaware of reaction.   Antihistamines, Chlorpheniramine-type Other (See Comments)   "HYPER"      Medication List    STOP taking these medications   aspirin EC 81 MG tablet   Biotin 5000 MCG Tabs   CALCIUM 1200+D3 PO   Co-Enzyme Q10 200 MG Caps   diclofenac sodium 1 % Gel Commonly known as:  VOLTAREN   Fish Oil 1200 MG Caps   HYDROcodone-acetaminophen 5-325 MG tablet Commonly known as:  NORCO/VICODIN   Magnesium 500 MG Tabs   meloxicam 7.5 MG tablet Commonly known as:  MOBIC   multivitamin with minerals tablet   SUPER B COMPLEX PO     TAKE these medications   albuterol 108 (90 Base) MCG/ACT inhaler Commonly known as:  PROVENTIL  HFA;VENTOLIN HFA Inhale 1 puff into the lungs every 6 (six) hours as needed for wheezing or shortness of breath.   BREO ELLIPTA 100-25 MCG/INH Aepb Generic drug:  fluticasone furoate-vilanterol Inhale 2 puffs into the lungs daily.   estradiol 1 MG tablet Commonly known as:  ESTRACE Take 1 mg by mouth every evening.   fexofenadine 180 MG tablet Commonly known as:  ALLEGRA Take 180 mg by mouth daily as needed for allergies or rhinitis.   furosemide 20 MG tablet Commonly known as:  LASIX Take 20 mg by mouth daily as needed for fluid.   glipiZIDE 10 MG tablet Commonly known as:  GLUCOTROL Take 10 mg by mouth 2 (two) times daily.   losartan 100 MG tablet Commonly known as:  COZAAR Take 100 mg by mouth every evening.   metFORMIN 1000 MG tablet Commonly known as:  GLUCOPHAGE Take 1,000 mg by mouth 2 (two) times daily.   methocarbamol 500 MG tablet Commonly known as:  ROBAXIN Take 1 tablet (500 mg total) by mouth every 6 (six) hours as needed for muscle spasms.   metoprolol succinate 50 MG 24 hr tablet Commonly known as:  TOPROL-XL Take 1 tablet (50 mg total) by mouth 2 (two) times daily.   omeprazole 20 MG capsule Commonly known as:  PRILOSEC Take 20 mg by mouth daily as needed (for acid reflux).   oxyCODONE 5 MG immediate release tablet Commonly known as:  Oxy IR/ROXICODONE Take 1-2 tablets (5-10 mg total) by mouth every 6 (six) hours as needed for severe pain.   potassium chloride 10 MEQ tablet Commonly known as:  K-DUR,KLOR-CON Take 10 mEq by mouth daily.   rivaroxaban 10 MG Tabs tablet Commonly known as:  XARELTO Take 1 tablet (10 mg total) by mouth daily with breakfast for 20 days. Then change to one baby Aspirin (81 mg) once a day for three weeks.  Then discontinue Aspirin. Start taking on:  June 16, 2018   rosuvastatin 10 MG tablet Commonly known as:  CRESTOR Take 10 mg by mouth every evening.   TOUJEO SOLOSTAR 300 UNIT/ML Sopn Generic drug:  Insulin  Glargine Inject 60 Units into the skin daily.   traMADol 50 MG tablet Commonly known as:  ULTRAM Take 1-2 tablets (50-100 mg total) by mouth every 6 (six) hours as needed for moderate pain.            Discharge  Care Instructions  (From admission, onward)         Start     Ordered   06/15/18 0000  Weight bearing as tolerated     06/15/18 0738   06/15/18 0000  Change dressing    Comments:  Change dressing on Thursday, then change the dressing daily with sterile 4 x 4 inch gauze dressing and apply TED hose.   06/15/18 0738   06/14/18 0000  Weight bearing as tolerated     06/14/18 0729   06/14/18 0000  Change dressing    Comments:  Change the dressing daily with sterile 4 x 4 inch gauze dressing and apply TED hose.   06/14/18 0729         Follow-up Information    Gaynelle Arabian, MD. Schedule an appointment as soon as possible for a visit on 06/28/2018.   Specialty:  Orthopedic Surgery Contact information: [address] [place] [address] [phone]           Signed: Griffith Citron, PA-C Orthopedic Surgery 06/15/2018, 1:10 PM

## 2018-06-15 NOTE — Plan of Care (Signed)
Patient discharged home in stable condition 

## 2018-06-15 NOTE — Progress Notes (Signed)
Physical Therapy Treatment Patient Details Name: [name] MRN: [id number] DOB: 12/23/1940 Today's Date: 06/15/2018    History of Present Illness 78 yo female s/p L TKA 06/13/18. Hx of multiple lumbar surgeries, neuropathy, DM, spinal cord stimulator.     PT Comments    Progressing with mobility. Pt reported increased pain on today. Reviewed/practiced exercises, gait training, and stair training. Issued HEP for pt to perform 2x/day until she begins OP PT. All education completed-made RN aware.     Follow Up Recommendations  Follow surgeon's recommendation for DC plan and follow-up therapies;Supervision for mobility/OOB     Equipment Recommendations  None recommended by PT    Recommendations for Other Services       Precautions / Restrictions Precautions Precautions: Fall;Knee Required Braces or Orthoses: Knee Immobilizer - Left Knee Immobilizer - Left: Discontinue once straight leg raise with < 10 degree lag Restrictions Weight Bearing Restrictions: No Other Position/Activity Restrictions: WBAT    Mobility  Bed Mobility Overal bed mobility: Needs Assistance Bed Mobility: Sit to Supine       Sit to supine: Min assist   General bed mobility comments: Assist for L LE. Increased time. Cues for safety, technique.   Transfers Overall transfer level: Needs assistance Equipment used: Rolling walker (2 wheeled) Transfers: Sit to/from Stand Sit to Stand: Min guard         General transfer comment: Close guard for safety. VCs safety, technique, hand/LE placement  Ambulation/Gait Ambulation/Gait assistance: Min guard Gait Distance (Feet): 65 Feet Assistive device: Rolling walker (2 wheeled) Gait Pattern/deviations: Step-to pattern     General Gait Details: VCs safety, technique, sequence. Close guard for safety. Slow gait speed.    Stairs Stairs: Yes Stairs assistance: Min guard Stair Management: Step to pattern;Two rails;Forwards Number of Stairs:  2 General stair comments: up and over portable steps. VCSs safety, technique, sequence. Husband present to observe/assist as needed.   Wheelchair Mobility    Modified Rankin (Stroke Patients Only)       Balance Overall balance assessment: Needs assistance         Standing balance support: Bilateral upper extremity supported Standing balance-Leahy Scale: Poor                              Cognition Arousal/Alertness: Awake/alert Behavior During Therapy: WFL for tasks assessed/performed Overall Cognitive Status: Within Functional Limits for tasks assessed                                        Exercises Total Joint Exercises Ankle Circles/Pumps: AROM;Both;10 reps;Supine Quad Sets: AROM;Both;10 reps;Supine Heel Slides: AAROM;Left;10 reps;Supine Hip ABduction/ADduction: AAROM;Left;10 reps;Supine Straight Leg Raises: AAROM;Left;10 reps;Supine Goniometric ROM: ~10-60 degrees    General Comments        Pertinent Vitals/Pain Pain Assessment: 0-10 Pain Score: 7  Pain Location: L knee Pain Descriptors / Indicators: Aching;Sore;Discomfort Pain Intervention(s): Monitored during session;Repositioned;Ice applied    Home Living                      Prior Function            PT Goals (current goals can now be found in the care plan section) Progress towards PT goals: Progressing toward goals    Frequency    7X/week      PT Plan Current plan remains appropriate  Co-evaluation              AM-PAC PT "6 Clicks" Mobility   Outcome Measure  Help needed turning from your back to your side while in a flat bed without using bedrails?: A Little Help needed moving from lying on your back to sitting on the side of a flat bed without using bedrails?: A Little Help needed moving to and from a bed to a chair (including a wheelchair)?: A Little Help needed standing up from a chair using your arms (e.g., wheelchair or bedside  chair)?: A Little Help needed to walk in hospital room?: A Little Help needed climbing 3-5 steps with a railing? : A Little 6 Click Score: 18    End of Session Equipment Utilized During Treatment: Gait belt Activity Tolerance: Patient tolerated treatment well Patient left: in chair;with call bell/phone within reach;with family/visitor present   PT Visit Diagnosis: Pain;Other abnormalities of gait and mobility (R26.89) Pain - Right/Left: Left Pain - part of body: Knee     Time: 1030-1051 PT Time Calculation (min) (ACUTE ONLY): 21 min  Charges:  $Gait Training: 8-22 mins                        Weston Anna, [place] Pager: [phone] Office: [phone]

## 2018-06-15 NOTE — Progress Notes (Signed)
   Subjective: 2 Days Post-Op Procedure(s) (LRB): TOTAL KNEE ARTHROPLASTY (Left) Patient reports pain as mild.   Patient seen in rounds with Dr. Wynelle Link. Patient is well, and has had no acute complaints or problems other than pain in the left knee. No issues overnight. Voiding without difficulty, positive flatus. No SHOB, calf pain, or chest pain.  Plan is to go Home after hospital stay.  Objective: Vital signs in last 24 hours: Temp:  [97.5 F (36.4 C)-99.5 F (37.5 C)] 99.5 F (37.5 C) (02/05 0514) Pulse Rate:  [70-82] 82 (02/05 0514) Resp:  [16-17] 17 (02/04 2104) BP: (113-129)/(58-67) 116/67 (02/05 0514) SpO2:  [92 %-97 %] 94 % (02/05 0514)  Intake/Output from previous day:  Intake/Output Summary (Last 24 hours) at 06/15/2018 0723 Last data filed at 06/15/2018 0314 Gross per 24 hour  Intake 1060 ml  Output 1000 ml  Net 60 ml    Intake/Output this shift: No intake/output data recorded.  Labs: Recent Labs    06/14/18 0521 06/15/18 0536  HGB 10.5* 9.8*   Recent Labs    06/14/18 0521 06/15/18 0536  WBC 12.9* 12.0*  RBC 4.02 3.74*  HCT 33.8* 31.6*  PLT 225 205   Recent Labs    06/14/18 0521 06/15/18 0536  NA 136 138  K 4.5 4.4  CL 108 109  CO2 22 22  BUN 24* 26*  CREATININE 1.07* 1.15*  GLUCOSE 272* 149*  CALCIUM 8.8* 8.8*   No results for input(s): LABPT, INR in the last 72 hours.  Exam: General - Patient is Alert and Oriented Extremity - Neurologically intact Sensation intact distally Intact pulses distally Dorsiflexion/Plantar flexion intact Dressing/Incision - clean, dry, no drainage Motor Function - intact, moving foot and toes well on exam.   Past Medical History:  Diagnosis Date  . Anemia   . Arthritis   . Battery end of life of spinal cord stimulator 2016   battery replaced.   Marland Kitchen COPD (chronic obstructive pulmonary disease) ([place])    'Mild' per pt report  . Diabetes mellitus   . DVT (deep venous thrombosis) ([place])   . Dyspnea    on  exertion  . GERD (gastroesophageal reflux disease)   . History of kidney stones   . History of stomach ulcers   . Hypertension    dr Marlou Porch  . Neuropathy   . Rapid heartbeat   . Sleep apnea    borderline study; could use a CPAP if desired.     Assessment/Plan: 2 Days Post-Op Procedure(s) (LRB): TOTAL KNEE ARTHROPLASTY (Left) Principal Problem:   OA (osteoarthritis) of knee  Estimated body mass index is 32.32 kg/m as calculated from the following:   Height as of this encounter: 5\' 5"  (1.651 m).   Weight as of this encounter: 88.1 kg. Advance diet Up with therapy D/C IV fluids  DVT Prophylaxis - Xarelto Weight-bearing as tolerated  Plan for discharge today following one session of therapy. Patient will begin outpatient therapy at [facility] on Friday. Follow up in the office with Dr. Wynelle Link in two weeks.   Griffith Citron, PA-C Orthopedic Surgery 06/15/2018, 7:23 AM

## 2018-06-17 DIAGNOSIS — M25562 Pain in left knee: Secondary | ICD-10-CM | POA: Diagnosis not present

## 2018-06-20 DIAGNOSIS — M25562 Pain in left knee: Secondary | ICD-10-CM | POA: Diagnosis not present

## 2018-06-22 DIAGNOSIS — M25562 Pain in left knee: Secondary | ICD-10-CM | POA: Diagnosis not present

## 2018-06-24 DIAGNOSIS — M25562 Pain in left knee: Secondary | ICD-10-CM | POA: Diagnosis not present

## 2018-06-27 DIAGNOSIS — M25562 Pain in left knee: Secondary | ICD-10-CM | POA: Diagnosis not present

## 2018-06-29 DIAGNOSIS — G4700 Insomnia, unspecified: Secondary | ICD-10-CM | POA: Diagnosis not present

## 2018-06-29 DIAGNOSIS — J069 Acute upper respiratory infection, unspecified: Secondary | ICD-10-CM | POA: Diagnosis not present

## 2018-06-29 DIAGNOSIS — M25562 Pain in left knee: Secondary | ICD-10-CM | POA: Diagnosis not present

## 2018-07-01 DIAGNOSIS — M25562 Pain in left knee: Secondary | ICD-10-CM | POA: Diagnosis not present

## 2018-07-05 DIAGNOSIS — M25562 Pain in left knee: Secondary | ICD-10-CM | POA: Diagnosis not present

## 2018-07-05 IMAGING — US US EXTREM LOW VENOUS BILAT
1 series · 13 of 24 positions shown · non-contrast
Comparison: None.

CLINICAL DATA: Bilateral lower extremity edema



[Series 1: us extrem low venous bilat · 0.07mm/px · 13 of 51 slices shown]
[im 1/51]
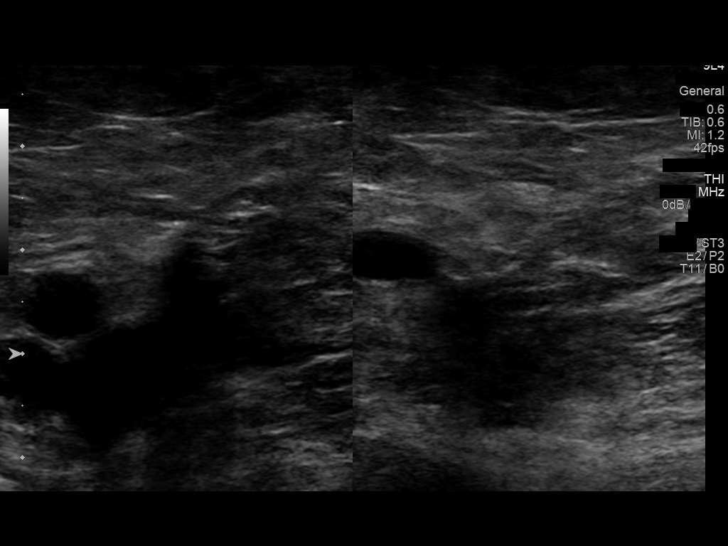
[im 5/51]
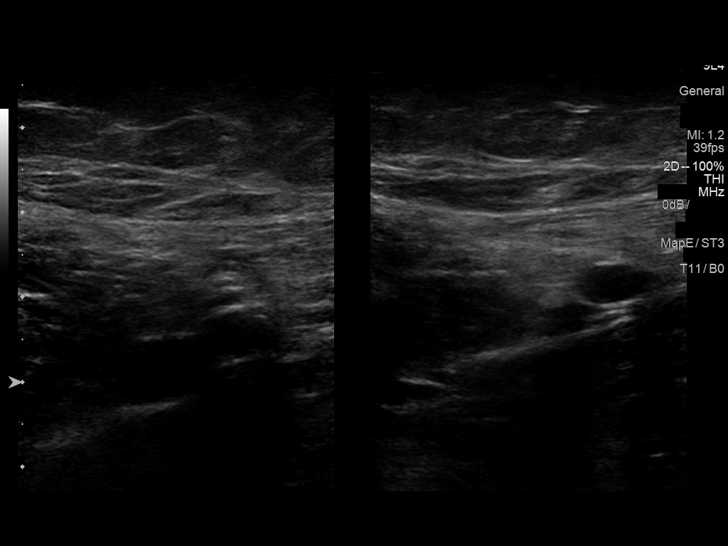
[im 9/51]
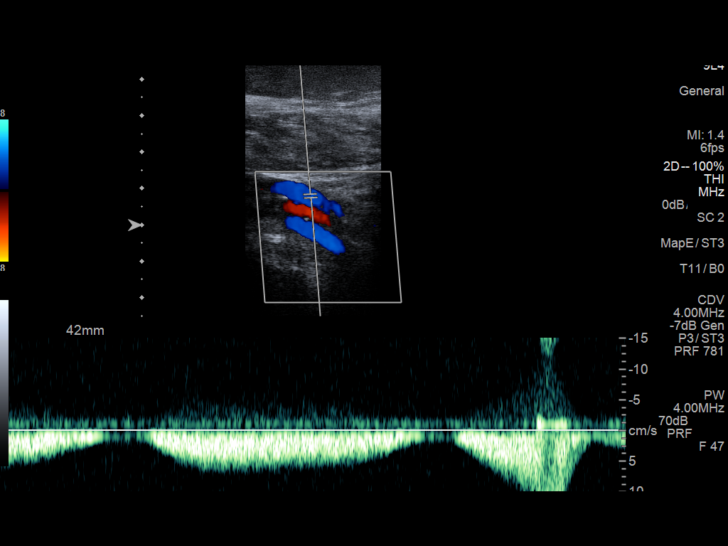
[im 14/51]
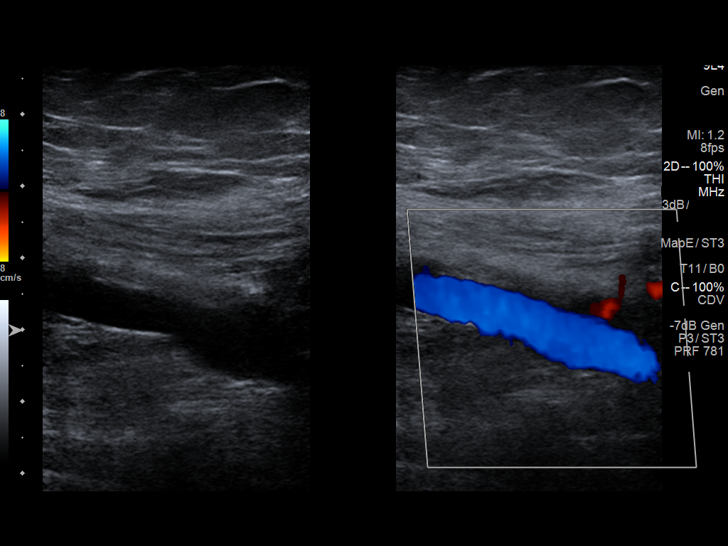
[im 18/51]
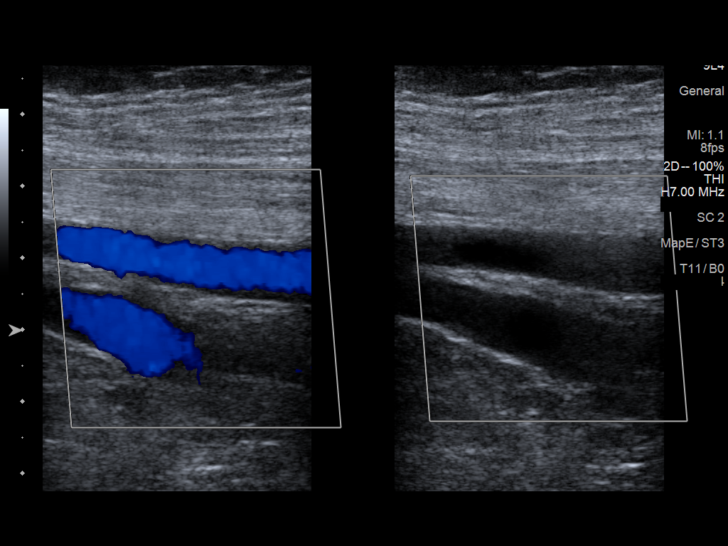
[im 22/51]
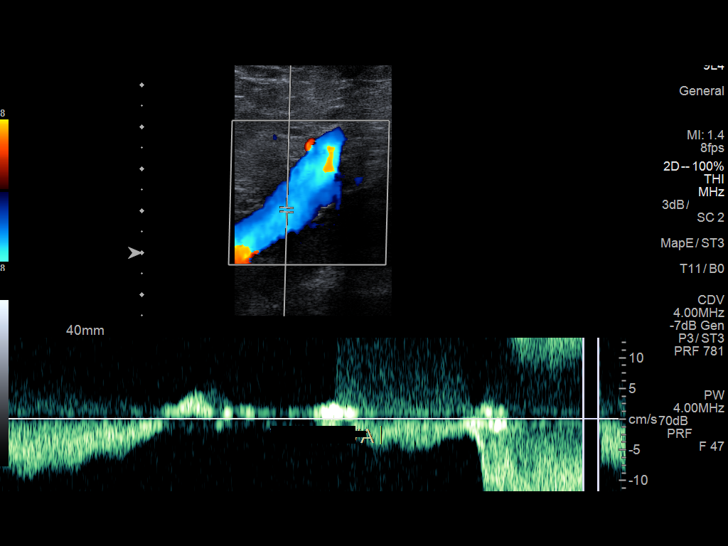
[im 27/51]
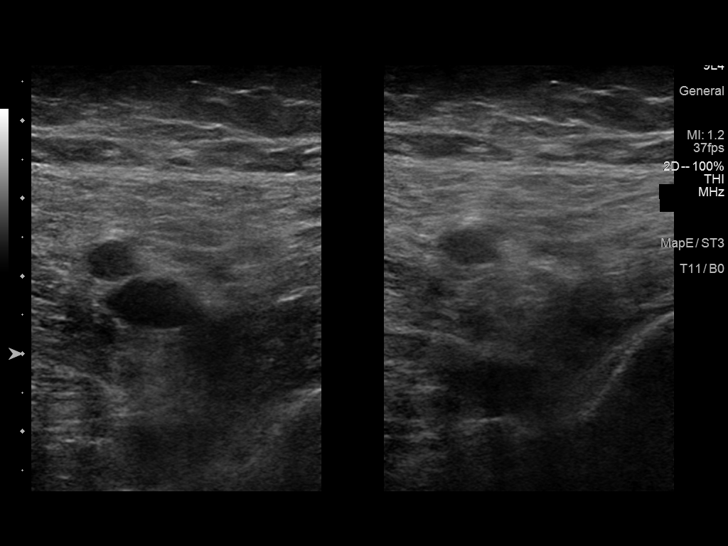
[im 29/51]
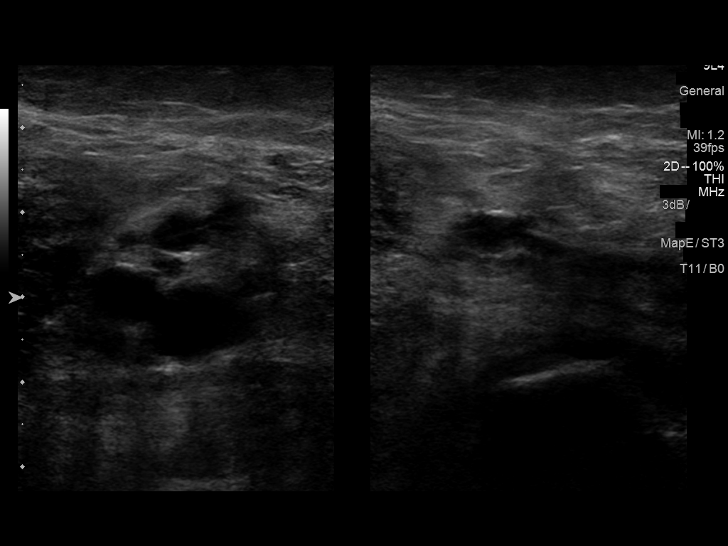
[im 33/51]
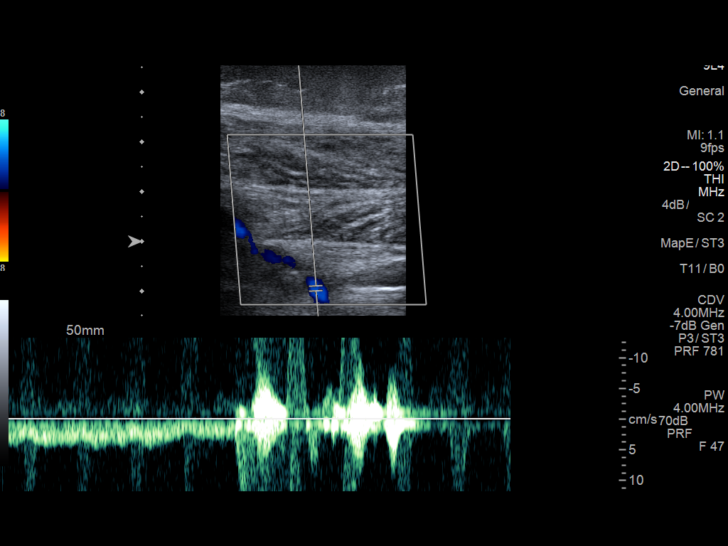
[im 37/51]
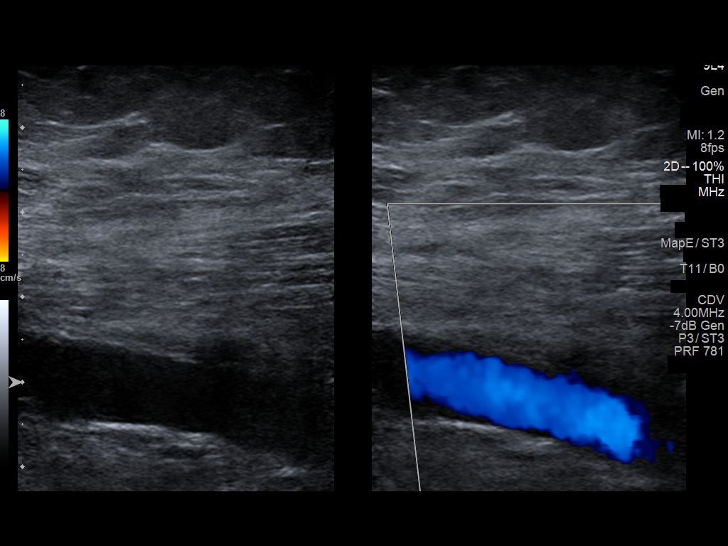
[im 42/51]
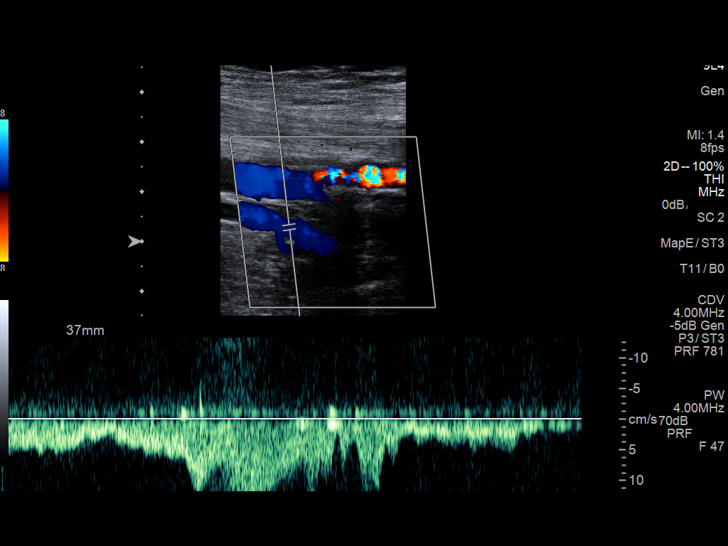
[im 46/51]
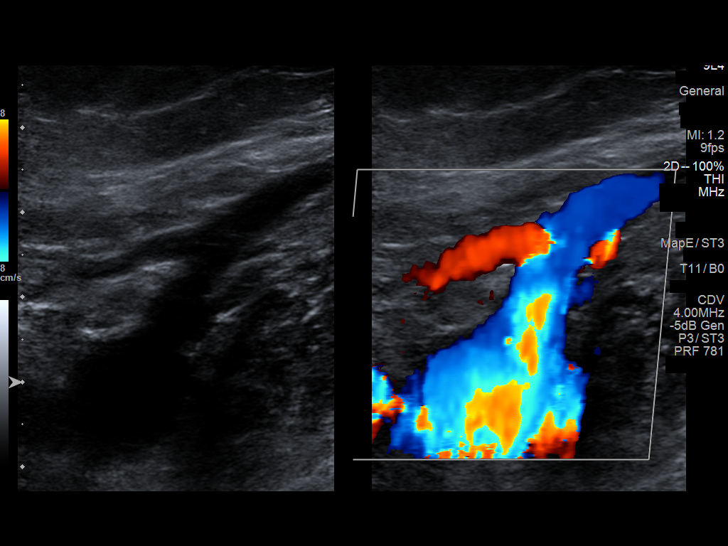
[im 51/51]
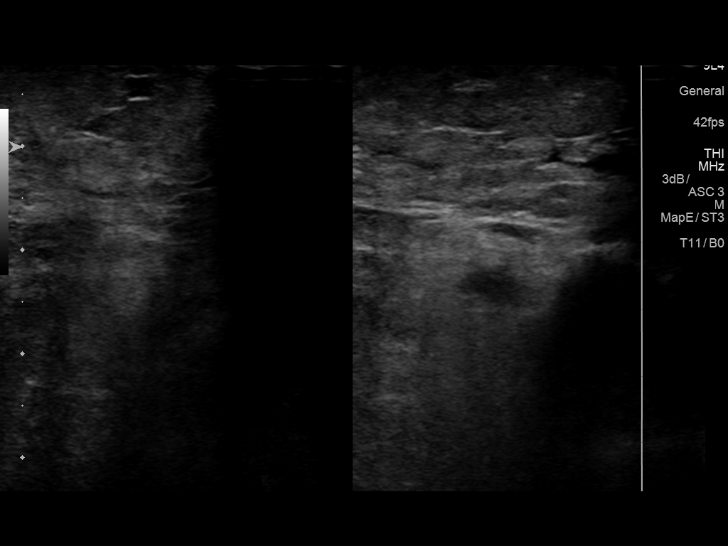

[13 of 24 positions shown; findings below may reference images not displayed]

FINDINGS: RIGHT LOWER EXTREMITY

Common Femoral Vein: No evidence of thrombus. Normal
compressibility, respiratory phasicity and response to augmentation.

Saphenofemoral Junction: No evidence of thrombus. Normal
compressibility and flow on color Doppler imaging.

Profunda Femoral Vein: No evidence of thrombus. Normal
compressibility and flow on color Doppler imaging.

Femoral Vein: No evidence of thrombus. Normal compressibility,
respiratory phasicity and response to augmentation.

Popliteal Vein: No evidence of thrombus. Normal compressibility,
respiratory phasicity and response to augmentation.

Calf Veins: No evidence of thrombus. Normal compressibility and flow
on color Doppler imaging.

Superficial Great Saphenous Vein: No evidence of thrombus. Normal
compressibility and flow on color Doppler imaging.

Venous Reflux:  None.

Other Findings:  None.

LEFT LOWER EXTREMITY

Common Femoral Vein: No evidence of thrombus. Normal
compressibility, respiratory phasicity and response to augmentation.

Saphenofemoral Junction: No evidence of thrombus. Normal
compressibility and flow on color Doppler imaging.

Profunda Femoral Vein: No evidence of thrombus. Normal
compressibility and flow on color Doppler imaging.

Femoral Vein: No evidence of thrombus. Normal compressibility,
respiratory phasicity and response to augmentation.

Popliteal Vein: No evidence of thrombus. Normal compressibility,
respiratory phasicity and response to augmentation.

Calf Veins: No evidence of thrombus. Normal compressibility and flow
on color Doppler imaging.

Superficial Great Saphenous Vein: No evidence of thrombus. Normal
compressibility and flow on color Doppler imaging.

Venous Reflux:  None.

Other Findings:  None.
IMPRESSION: No evidence of DVT within either lower extremity.

## 2018-07-07 DIAGNOSIS — M25562 Pain in left knee: Secondary | ICD-10-CM | POA: Diagnosis not present

## 2018-07-11 DIAGNOSIS — M25562 Pain in left knee: Secondary | ICD-10-CM | POA: Diagnosis not present

## 2018-07-13 DIAGNOSIS — M25562 Pain in left knee: Secondary | ICD-10-CM | POA: Diagnosis not present

## 2018-07-19 DIAGNOSIS — Z471 Aftercare following joint replacement surgery: Secondary | ICD-10-CM | POA: Diagnosis not present

## 2018-07-19 DIAGNOSIS — Z96652 Presence of left artificial knee joint: Secondary | ICD-10-CM | POA: Diagnosis not present

## 2018-07-20 DIAGNOSIS — M25562 Pain in left knee: Secondary | ICD-10-CM | POA: Diagnosis not present

## 2018-07-28 DIAGNOSIS — M5442 Lumbago with sciatica, left side: Secondary | ICD-10-CM | POA: Diagnosis not present

## 2018-07-28 DIAGNOSIS — M9903 Segmental and somatic dysfunction of lumbar region: Secondary | ICD-10-CM | POA: Diagnosis not present

## 2018-07-28 DIAGNOSIS — M62830 Muscle spasm of back: Secondary | ICD-10-CM | POA: Diagnosis not present

## 2018-07-28 DIAGNOSIS — M5441 Lumbago with sciatica, right side: Secondary | ICD-10-CM | POA: Diagnosis not present

## 2018-08-01 ENCOUNTER — Ambulatory Visit: Payer: PPO | Admitting: Orthotics

## 2018-08-02 DIAGNOSIS — D509 Iron deficiency anemia, unspecified: Secondary | ICD-10-CM | POA: Diagnosis not present

## 2018-08-02 DIAGNOSIS — I10 Essential (primary) hypertension: Secondary | ICD-10-CM | POA: Diagnosis not present

## 2018-08-02 DIAGNOSIS — M179 Osteoarthritis of knee, unspecified: Secondary | ICD-10-CM | POA: Diagnosis not present

## 2018-08-02 DIAGNOSIS — E1140 Type 2 diabetes mellitus with diabetic neuropathy, unspecified: Secondary | ICD-10-CM | POA: Diagnosis not present

## 2018-08-02 DIAGNOSIS — E782 Mixed hyperlipidemia: Secondary | ICD-10-CM | POA: Diagnosis not present

## 2018-08-02 DIAGNOSIS — J449 Chronic obstructive pulmonary disease, unspecified: Secondary | ICD-10-CM | POA: Diagnosis not present

## 2018-08-09 DIAGNOSIS — E1140 Type 2 diabetes mellitus with diabetic neuropathy, unspecified: Secondary | ICD-10-CM | POA: Diagnosis not present

## 2018-08-09 DIAGNOSIS — I10 Essential (primary) hypertension: Secondary | ICD-10-CM | POA: Diagnosis not present

## 2018-08-09 DIAGNOSIS — D509 Iron deficiency anemia, unspecified: Secondary | ICD-10-CM | POA: Diagnosis not present

## 2018-08-09 DIAGNOSIS — E782 Mixed hyperlipidemia: Secondary | ICD-10-CM | POA: Diagnosis not present

## 2018-08-09 DIAGNOSIS — J449 Chronic obstructive pulmonary disease, unspecified: Secondary | ICD-10-CM | POA: Diagnosis not present

## 2018-08-09 DIAGNOSIS — M179 Osteoarthritis of knee, unspecified: Secondary | ICD-10-CM | POA: Diagnosis not present

## 2018-08-11 ENCOUNTER — Other Ambulatory Visit: Payer: Self-pay | Admitting: Internal Medicine

## 2018-08-11 DIAGNOSIS — Z1231 Encounter for screening mammogram for malignant neoplasm of breast: Secondary | ICD-10-CM

## 2018-08-22 DIAGNOSIS — Z96652 Presence of left artificial knee joint: Secondary | ICD-10-CM | POA: Insufficient documentation

## 2018-08-22 DIAGNOSIS — Z5189 Encounter for other specified aftercare: Secondary | ICD-10-CM | POA: Insufficient documentation

## 2018-08-23 DIAGNOSIS — M9903 Segmental and somatic dysfunction of lumbar region: Secondary | ICD-10-CM | POA: Diagnosis not present

## 2018-08-23 DIAGNOSIS — M5441 Lumbago with sciatica, right side: Secondary | ICD-10-CM | POA: Diagnosis not present

## 2018-08-23 DIAGNOSIS — M62830 Muscle spasm of back: Secondary | ICD-10-CM | POA: Diagnosis not present

## 2018-08-23 DIAGNOSIS — M5442 Lumbago with sciatica, left side: Secondary | ICD-10-CM | POA: Diagnosis not present

## 2018-08-25 DIAGNOSIS — M9903 Segmental and somatic dysfunction of lumbar region: Secondary | ICD-10-CM | POA: Diagnosis not present

## 2018-08-25 DIAGNOSIS — M62830 Muscle spasm of back: Secondary | ICD-10-CM | POA: Diagnosis not present

## 2018-08-25 DIAGNOSIS — M5441 Lumbago with sciatica, right side: Secondary | ICD-10-CM | POA: Diagnosis not present

## 2018-08-25 DIAGNOSIS — M5442 Lumbago with sciatica, left side: Secondary | ICD-10-CM | POA: Diagnosis not present

## 2018-08-29 DIAGNOSIS — M9903 Segmental and somatic dysfunction of lumbar region: Secondary | ICD-10-CM | POA: Diagnosis not present

## 2018-08-29 DIAGNOSIS — M62830 Muscle spasm of back: Secondary | ICD-10-CM | POA: Diagnosis not present

## 2018-08-29 DIAGNOSIS — M5442 Lumbago with sciatica, left side: Secondary | ICD-10-CM | POA: Diagnosis not present

## 2018-08-29 DIAGNOSIS — M5441 Lumbago with sciatica, right side: Secondary | ICD-10-CM | POA: Diagnosis not present

## 2018-09-01 DIAGNOSIS — M179 Osteoarthritis of knee, unspecified: Secondary | ICD-10-CM | POA: Diagnosis not present

## 2018-09-01 DIAGNOSIS — I10 Essential (primary) hypertension: Secondary | ICD-10-CM | POA: Diagnosis not present

## 2018-09-01 DIAGNOSIS — E1140 Type 2 diabetes mellitus with diabetic neuropathy, unspecified: Secondary | ICD-10-CM | POA: Diagnosis not present

## 2018-09-01 DIAGNOSIS — M9903 Segmental and somatic dysfunction of lumbar region: Secondary | ICD-10-CM | POA: Diagnosis not present

## 2018-09-01 DIAGNOSIS — J449 Chronic obstructive pulmonary disease, unspecified: Secondary | ICD-10-CM | POA: Diagnosis not present

## 2018-09-01 DIAGNOSIS — D509 Iron deficiency anemia, unspecified: Secondary | ICD-10-CM | POA: Diagnosis not present

## 2018-09-01 DIAGNOSIS — M5441 Lumbago with sciatica, right side: Secondary | ICD-10-CM | POA: Diagnosis not present

## 2018-09-01 DIAGNOSIS — E782 Mixed hyperlipidemia: Secondary | ICD-10-CM | POA: Diagnosis not present

## 2018-09-01 DIAGNOSIS — M62830 Muscle spasm of back: Secondary | ICD-10-CM | POA: Diagnosis not present

## 2018-09-01 DIAGNOSIS — M5442 Lumbago with sciatica, left side: Secondary | ICD-10-CM | POA: Diagnosis not present

## 2018-09-08 DIAGNOSIS — M533 Sacrococcygeal disorders, not elsewhere classified: Secondary | ICD-10-CM | POA: Diagnosis not present

## 2018-09-12 ENCOUNTER — Ambulatory Visit: Payer: PPO | Admitting: Orthotics

## 2018-09-12 ENCOUNTER — Other Ambulatory Visit: Payer: Self-pay

## 2018-09-12 ENCOUNTER — Encounter

## 2018-09-12 VITALS — Temp 97.2°F

## 2018-09-12 DIAGNOSIS — E1149 Type 2 diabetes mellitus with other diabetic neurological complication: Secondary | ICD-10-CM

## 2018-09-12 DIAGNOSIS — E1140 Type 2 diabetes mellitus with diabetic neuropathy, unspecified: Secondary | ICD-10-CM

## 2018-09-12 DIAGNOSIS — M2022 Hallux rigidus, left foot: Principal | ICD-10-CM

## 2018-09-12 DIAGNOSIS — M2021 Hallux rigidus, right foot: Secondary | ICD-10-CM

## 2018-09-12 NOTE — Progress Notes (Signed)
Shoes reordered to size 9.5 W...10x too big.Marland KitchenMarland KitchenMarland Kitchen

## 2018-09-26 ENCOUNTER — Ambulatory Visit: Payer: PPO | Admitting: Orthotics

## 2018-09-26 ENCOUNTER — Other Ambulatory Visit: Payer: Self-pay

## 2018-09-26 DIAGNOSIS — E1149 Type 2 diabetes mellitus with other diabetic neurological complication: Secondary | ICD-10-CM

## 2018-09-26 DIAGNOSIS — E1140 Type 2 diabetes mellitus with diabetic neuropathy, unspecified: Secondary | ICD-10-CM

## 2018-09-26 DIAGNOSIS — M2041 Other hammer toe(s) (acquired), right foot: Secondary | ICD-10-CM

## 2018-09-26 NOTE — Progress Notes (Signed)
Patient picked up reordered shoes and the fit well.

## 2018-09-27 DIAGNOSIS — M545 Low back pain: Secondary | ICD-10-CM | POA: Diagnosis not present

## 2018-10-04 DIAGNOSIS — D509 Iron deficiency anemia, unspecified: Secondary | ICD-10-CM | POA: Diagnosis not present

## 2018-10-04 DIAGNOSIS — J449 Chronic obstructive pulmonary disease, unspecified: Secondary | ICD-10-CM | POA: Diagnosis not present

## 2018-10-04 DIAGNOSIS — E1140 Type 2 diabetes mellitus with diabetic neuropathy, unspecified: Secondary | ICD-10-CM | POA: Diagnosis not present

## 2018-10-04 DIAGNOSIS — M179 Osteoarthritis of knee, unspecified: Secondary | ICD-10-CM | POA: Diagnosis not present

## 2018-10-04 DIAGNOSIS — E782 Mixed hyperlipidemia: Secondary | ICD-10-CM | POA: Diagnosis not present

## 2018-10-04 DIAGNOSIS — I10 Essential (primary) hypertension: Secondary | ICD-10-CM | POA: Diagnosis not present

## 2018-10-06 DIAGNOSIS — K219 Gastro-esophageal reflux disease without esophagitis: Secondary | ICD-10-CM | POA: Diagnosis not present

## 2018-10-06 DIAGNOSIS — E782 Mixed hyperlipidemia: Secondary | ICD-10-CM | POA: Diagnosis not present

## 2018-10-06 DIAGNOSIS — G4733 Obstructive sleep apnea (adult) (pediatric): Secondary | ICD-10-CM | POA: Diagnosis not present

## 2018-10-06 DIAGNOSIS — J449 Chronic obstructive pulmonary disease, unspecified: Secondary | ICD-10-CM | POA: Diagnosis not present

## 2018-10-06 DIAGNOSIS — D509 Iron deficiency anemia, unspecified: Secondary | ICD-10-CM | POA: Diagnosis not present

## 2018-10-06 DIAGNOSIS — Z1389 Encounter for screening for other disorder: Secondary | ICD-10-CM | POA: Diagnosis not present

## 2018-10-06 DIAGNOSIS — Z0001 Encounter for general adult medical examination with abnormal findings: Secondary | ICD-10-CM | POA: Diagnosis not present

## 2018-10-06 DIAGNOSIS — E1140 Type 2 diabetes mellitus with diabetic neuropathy, unspecified: Secondary | ICD-10-CM | POA: Diagnosis not present

## 2018-10-06 DIAGNOSIS — F419 Anxiety disorder, unspecified: Secondary | ICD-10-CM | POA: Diagnosis not present

## 2018-10-06 DIAGNOSIS — M179 Osteoarthritis of knee, unspecified: Secondary | ICD-10-CM | POA: Diagnosis not present

## 2018-10-06 DIAGNOSIS — I10 Essential (primary) hypertension: Secondary | ICD-10-CM | POA: Diagnosis not present

## 2018-10-11 ENCOUNTER — Ambulatory Visit: Payer: PPO

## 2018-10-12 ENCOUNTER — Ambulatory Visit: Payer: PPO

## 2018-10-12 ENCOUNTER — Ambulatory Visit
Admission: RE | Admit: 2018-10-12 | Discharge: 2018-10-12 | Disposition: A | Discharge status: Home / Self Care | Payer: PPO | Source: Ambulatory Visit | Attending: Internal Medicine | Admitting: Internal Medicine

## 2018-10-12 ENCOUNTER — Other Ambulatory Visit: Payer: Self-pay

## 2018-10-12 DIAGNOSIS — Z1231 Encounter for screening mammogram for malignant neoplasm of breast: Secondary | ICD-10-CM | POA: Diagnosis not present

## 2018-10-13 ENCOUNTER — Other Ambulatory Visit: Payer: Self-pay | Admitting: Internal Medicine

## 2018-10-13 DIAGNOSIS — I10 Essential (primary) hypertension: Secondary | ICD-10-CM | POA: Diagnosis not present

## 2018-10-13 DIAGNOSIS — R928 Other abnormal and inconclusive findings on diagnostic imaging of breast: Secondary | ICD-10-CM

## 2018-10-13 DIAGNOSIS — E1140 Type 2 diabetes mellitus with diabetic neuropathy, unspecified: Secondary | ICD-10-CM | POA: Diagnosis not present

## 2018-10-13 DIAGNOSIS — E559 Vitamin D deficiency, unspecified: Secondary | ICD-10-CM | POA: Diagnosis not present

## 2018-10-13 DIAGNOSIS — E782 Mixed hyperlipidemia: Secondary | ICD-10-CM | POA: Diagnosis not present

## 2018-10-13 DIAGNOSIS — D509 Iron deficiency anemia, unspecified: Secondary | ICD-10-CM | POA: Diagnosis not present

## 2018-10-13 DIAGNOSIS — G4733 Obstructive sleep apnea (adult) (pediatric): Secondary | ICD-10-CM | POA: Diagnosis not present

## 2018-10-18 ENCOUNTER — Ambulatory Visit
Admission: RE | Admit: 2018-10-18 | Discharge: 2018-10-18 | Disposition: A | Discharge status: Home / Self Care | Payer: PPO | Source: Ambulatory Visit | Attending: Internal Medicine | Admitting: Internal Medicine

## 2018-10-18 ENCOUNTER — Other Ambulatory Visit: Payer: Self-pay

## 2018-10-18 ENCOUNTER — Other Ambulatory Visit: Payer: Self-pay | Admitting: Internal Medicine

## 2018-10-18 DIAGNOSIS — N6320 Unspecified lump in the left breast, unspecified quadrant: Secondary | ICD-10-CM

## 2018-10-18 DIAGNOSIS — R928 Other abnormal and inconclusive findings on diagnostic imaging of breast: Secondary | ICD-10-CM

## 2018-10-18 DIAGNOSIS — Z803 Family history of malignant neoplasm of breast: Secondary | ICD-10-CM | POA: Diagnosis not present

## 2018-10-18 DIAGNOSIS — N6012 Diffuse cystic mastopathy of left breast: Secondary | ICD-10-CM | POA: Diagnosis not present

## 2018-10-21 ENCOUNTER — Ambulatory Visit
Admission: RE | Admit: 2018-10-21 | Discharge: 2018-10-21 | Disposition: A | Discharge status: Home / Self Care | Payer: PPO | Source: Ambulatory Visit | Attending: Internal Medicine | Admitting: Internal Medicine

## 2018-10-21 ENCOUNTER — Other Ambulatory Visit: Payer: Self-pay

## 2018-10-21 DIAGNOSIS — R928 Other abnormal and inconclusive findings on diagnostic imaging of breast: Secondary | ICD-10-CM | POA: Diagnosis not present

## 2018-10-21 DIAGNOSIS — N6320 Unspecified lump in the left breast, unspecified quadrant: Secondary | ICD-10-CM

## 2018-10-21 DIAGNOSIS — N6012 Diffuse cystic mastopathy of left breast: Secondary | ICD-10-CM | POA: Diagnosis not present

## 2018-11-03 DIAGNOSIS — J449 Chronic obstructive pulmonary disease, unspecified: Secondary | ICD-10-CM | POA: Diagnosis not present

## 2018-11-03 DIAGNOSIS — I10 Essential (primary) hypertension: Secondary | ICD-10-CM | POA: Diagnosis not present

## 2018-11-03 DIAGNOSIS — D509 Iron deficiency anemia, unspecified: Secondary | ICD-10-CM | POA: Diagnosis not present

## 2018-11-03 DIAGNOSIS — M179 Osteoarthritis of knee, unspecified: Secondary | ICD-10-CM | POA: Diagnosis not present

## 2018-11-03 DIAGNOSIS — E782 Mixed hyperlipidemia: Secondary | ICD-10-CM | POA: Diagnosis not present

## 2018-11-03 DIAGNOSIS — E1140 Type 2 diabetes mellitus with diabetic neuropathy, unspecified: Secondary | ICD-10-CM | POA: Diagnosis not present

## 2018-11-10 DIAGNOSIS — M5442 Lumbago with sciatica, left side: Secondary | ICD-10-CM | POA: Diagnosis not present

## 2018-11-10 DIAGNOSIS — M62830 Muscle spasm of back: Secondary | ICD-10-CM | POA: Diagnosis not present

## 2018-11-10 DIAGNOSIS — M9903 Segmental and somatic dysfunction of lumbar region: Secondary | ICD-10-CM | POA: Diagnosis not present

## 2018-11-10 DIAGNOSIS — M5441 Lumbago with sciatica, right side: Secondary | ICD-10-CM | POA: Diagnosis not present

## 2018-11-15 DIAGNOSIS — M19011 Primary osteoarthritis, right shoulder: Secondary | ICD-10-CM | POA: Diagnosis not present

## 2018-11-30 DIAGNOSIS — M179 Osteoarthritis of knee, unspecified: Secondary | ICD-10-CM | POA: Diagnosis not present

## 2018-11-30 DIAGNOSIS — I10 Essential (primary) hypertension: Secondary | ICD-10-CM | POA: Diagnosis not present

## 2018-11-30 DIAGNOSIS — J449 Chronic obstructive pulmonary disease, unspecified: Secondary | ICD-10-CM | POA: Diagnosis not present

## 2018-11-30 DIAGNOSIS — E782 Mixed hyperlipidemia: Secondary | ICD-10-CM | POA: Diagnosis not present

## 2018-11-30 DIAGNOSIS — D509 Iron deficiency anemia, unspecified: Secondary | ICD-10-CM | POA: Diagnosis not present

## 2018-11-30 DIAGNOSIS — E1140 Type 2 diabetes mellitus with diabetic neuropathy, unspecified: Secondary | ICD-10-CM | POA: Diagnosis not present

## 2018-12-01 DIAGNOSIS — H25813 Combined forms of age-related cataract, bilateral: Secondary | ICD-10-CM | POA: Diagnosis not present

## 2018-12-01 DIAGNOSIS — H52223 Regular astigmatism, bilateral: Secondary | ICD-10-CM | POA: Diagnosis not present

## 2018-12-01 DIAGNOSIS — E119 Type 2 diabetes mellitus without complications: Secondary | ICD-10-CM | POA: Diagnosis not present

## 2018-12-01 DIAGNOSIS — H524 Presbyopia: Secondary | ICD-10-CM | POA: Diagnosis not present

## 2018-12-01 DIAGNOSIS — H5203 Hypermetropia, bilateral: Secondary | ICD-10-CM | POA: Diagnosis not present

## 2018-12-28 ENCOUNTER — Other Ambulatory Visit: Payer: Self-pay | Admitting: Internal Medicine

## 2018-12-28 DIAGNOSIS — I739 Peripheral vascular disease, unspecified: Secondary | ICD-10-CM

## 2018-12-28 DIAGNOSIS — M179 Osteoarthritis of knee, unspecified: Secondary | ICD-10-CM | POA: Diagnosis not present

## 2018-12-28 DIAGNOSIS — E782 Mixed hyperlipidemia: Secondary | ICD-10-CM | POA: Diagnosis not present

## 2018-12-28 DIAGNOSIS — J449 Chronic obstructive pulmonary disease, unspecified: Secondary | ICD-10-CM | POA: Diagnosis not present

## 2018-12-28 DIAGNOSIS — E1140 Type 2 diabetes mellitus with diabetic neuropathy, unspecified: Secondary | ICD-10-CM | POA: Diagnosis not present

## 2018-12-28 DIAGNOSIS — R600 Localized edema: Secondary | ICD-10-CM

## 2018-12-28 DIAGNOSIS — I10 Essential (primary) hypertension: Secondary | ICD-10-CM | POA: Diagnosis not present

## 2018-12-28 DIAGNOSIS — D509 Iron deficiency anemia, unspecified: Secondary | ICD-10-CM | POA: Diagnosis not present

## 2018-12-28 DIAGNOSIS — N183 Chronic kidney disease, stage 3 (moderate): Secondary | ICD-10-CM | POA: Diagnosis not present

## 2019-01-04 ENCOUNTER — Ambulatory Visit
Admission: RE | Admit: 2019-01-04 | Discharge: 2019-01-04 | Disposition: A | Discharge status: Home / Self Care | Payer: PPO | Source: Ambulatory Visit | Attending: Internal Medicine | Admitting: Internal Medicine

## 2019-01-04 DIAGNOSIS — R600 Localized edema: Secondary | ICD-10-CM

## 2019-01-04 DIAGNOSIS — M179 Osteoarthritis of knee, unspecified: Secondary | ICD-10-CM | POA: Diagnosis not present

## 2019-01-04 DIAGNOSIS — F419 Anxiety disorder, unspecified: Secondary | ICD-10-CM | POA: Diagnosis not present

## 2019-01-04 DIAGNOSIS — J449 Chronic obstructive pulmonary disease, unspecified: Secondary | ICD-10-CM | POA: Diagnosis not present

## 2019-01-04 DIAGNOSIS — E559 Vitamin D deficiency, unspecified: Secondary | ICD-10-CM | POA: Diagnosis not present

## 2019-01-04 DIAGNOSIS — Z23 Encounter for immunization: Secondary | ICD-10-CM | POA: Diagnosis not present

## 2019-01-04 DIAGNOSIS — M545 Low back pain: Secondary | ICD-10-CM | POA: Diagnosis not present

## 2019-01-04 DIAGNOSIS — I739 Peripheral vascular disease, unspecified: Secondary | ICD-10-CM

## 2019-01-04 DIAGNOSIS — E1140 Type 2 diabetes mellitus with diabetic neuropathy, unspecified: Secondary | ICD-10-CM | POA: Diagnosis not present

## 2019-01-04 DIAGNOSIS — G2581 Restless legs syndrome: Secondary | ICD-10-CM | POA: Diagnosis not present

## 2019-01-04 DIAGNOSIS — Z8679 Personal history of other diseases of the circulatory system: Secondary | ICD-10-CM | POA: Diagnosis not present

## 2019-01-04 DIAGNOSIS — N183 Chronic kidney disease, stage 3 (moderate): Secondary | ICD-10-CM | POA: Diagnosis not present

## 2019-01-04 DIAGNOSIS — K219 Gastro-esophageal reflux disease without esophagitis: Secondary | ICD-10-CM | POA: Diagnosis not present

## 2019-01-04 DIAGNOSIS — G4733 Obstructive sleep apnea (adult) (pediatric): Secondary | ICD-10-CM | POA: Diagnosis not present

## 2019-01-04 DIAGNOSIS — I10 Essential (primary) hypertension: Secondary | ICD-10-CM | POA: Diagnosis not present

## 2019-01-17 DIAGNOSIS — M545 Low back pain: Secondary | ICD-10-CM | POA: Diagnosis not present

## 2019-01-18 ENCOUNTER — Other Ambulatory Visit: Payer: Self-pay | Admitting: Neurological Surgery

## 2019-01-18 DIAGNOSIS — G8929 Other chronic pain: Secondary | ICD-10-CM

## 2019-01-18 DIAGNOSIS — M545 Low back pain, unspecified: Secondary | ICD-10-CM

## 2019-01-24 ENCOUNTER — Ambulatory Visit
Admission: RE | Admit: 2019-01-24 | Discharge: 2019-01-24 | Disposition: A | Discharge status: Home / Self Care | Payer: PPO | Source: Ambulatory Visit | Attending: Neurological Surgery | Admitting: Neurological Surgery

## 2019-01-24 DIAGNOSIS — M533 Sacrococcygeal disorders, not elsewhere classified: Secondary | ICD-10-CM | POA: Diagnosis not present

## 2019-01-24 DIAGNOSIS — M545 Low back pain, unspecified: Secondary | ICD-10-CM

## 2019-01-24 DIAGNOSIS — G8929 Other chronic pain: Secondary | ICD-10-CM

## 2019-01-26 DIAGNOSIS — M545 Low back pain: Secondary | ICD-10-CM | POA: Diagnosis not present

## 2019-01-26 DIAGNOSIS — M6281 Muscle weakness (generalized): Secondary | ICD-10-CM | POA: Diagnosis not present

## 2019-01-26 DIAGNOSIS — M79605 Pain in left leg: Secondary | ICD-10-CM | POA: Diagnosis not present

## 2019-01-26 DIAGNOSIS — R262 Difficulty in walking, not elsewhere classified: Secondary | ICD-10-CM | POA: Diagnosis not present

## 2019-02-01 DIAGNOSIS — M6281 Muscle weakness (generalized): Secondary | ICD-10-CM | POA: Diagnosis not present

## 2019-02-01 DIAGNOSIS — M79605 Pain in left leg: Secondary | ICD-10-CM | POA: Diagnosis not present

## 2019-02-01 DIAGNOSIS — R262 Difficulty in walking, not elsewhere classified: Secondary | ICD-10-CM | POA: Diagnosis not present

## 2019-02-01 DIAGNOSIS — M545 Low back pain: Secondary | ICD-10-CM | POA: Diagnosis not present

## 2019-02-03 DIAGNOSIS — N201 Calculus of ureter: Secondary | ICD-10-CM | POA: Diagnosis not present

## 2019-02-03 DIAGNOSIS — R350 Frequency of micturition: Secondary | ICD-10-CM | POA: Diagnosis not present

## 2019-02-06 DIAGNOSIS — J449 Chronic obstructive pulmonary disease, unspecified: Secondary | ICD-10-CM | POA: Diagnosis not present

## 2019-02-06 DIAGNOSIS — R262 Difficulty in walking, not elsewhere classified: Secondary | ICD-10-CM | POA: Diagnosis not present

## 2019-02-06 DIAGNOSIS — I10 Essential (primary) hypertension: Secondary | ICD-10-CM | POA: Diagnosis not present

## 2019-02-06 DIAGNOSIS — M179 Osteoarthritis of knee, unspecified: Secondary | ICD-10-CM | POA: Diagnosis not present

## 2019-02-06 DIAGNOSIS — D509 Iron deficiency anemia, unspecified: Secondary | ICD-10-CM | POA: Diagnosis not present

## 2019-02-06 DIAGNOSIS — E782 Mixed hyperlipidemia: Secondary | ICD-10-CM | POA: Diagnosis not present

## 2019-02-06 DIAGNOSIS — M545 Low back pain: Secondary | ICD-10-CM | POA: Diagnosis not present

## 2019-02-06 DIAGNOSIS — M79605 Pain in left leg: Secondary | ICD-10-CM | POA: Diagnosis not present

## 2019-02-06 DIAGNOSIS — N183 Chronic kidney disease, stage 3 (moderate): Secondary | ICD-10-CM | POA: Diagnosis not present

## 2019-02-06 DIAGNOSIS — E1140 Type 2 diabetes mellitus with diabetic neuropathy, unspecified: Secondary | ICD-10-CM | POA: Diagnosis not present

## 2019-02-06 DIAGNOSIS — M6281 Muscle weakness (generalized): Secondary | ICD-10-CM | POA: Diagnosis not present

## 2019-02-08 DIAGNOSIS — M545 Low back pain: Secondary | ICD-10-CM | POA: Diagnosis not present

## 2019-02-08 DIAGNOSIS — R262 Difficulty in walking, not elsewhere classified: Secondary | ICD-10-CM | POA: Diagnosis not present

## 2019-02-08 DIAGNOSIS — M6281 Muscle weakness (generalized): Secondary | ICD-10-CM | POA: Diagnosis not present

## 2019-02-08 DIAGNOSIS — M79605 Pain in left leg: Secondary | ICD-10-CM | POA: Diagnosis not present

## 2019-02-13 DIAGNOSIS — M545 Low back pain: Secondary | ICD-10-CM | POA: Diagnosis not present

## 2019-02-13 DIAGNOSIS — R262 Difficulty in walking, not elsewhere classified: Secondary | ICD-10-CM | POA: Diagnosis not present

## 2019-02-13 DIAGNOSIS — M6281 Muscle weakness (generalized): Secondary | ICD-10-CM | POA: Diagnosis not present

## 2019-02-13 DIAGNOSIS — M79605 Pain in left leg: Secondary | ICD-10-CM | POA: Diagnosis not present

## 2019-02-16 ENCOUNTER — Ambulatory Visit: Payer: PPO | Admitting: Cardiology

## 2019-02-16 DIAGNOSIS — R262 Difficulty in walking, not elsewhere classified: Secondary | ICD-10-CM | POA: Diagnosis not present

## 2019-02-16 DIAGNOSIS — M79605 Pain in left leg: Secondary | ICD-10-CM | POA: Diagnosis not present

## 2019-02-16 DIAGNOSIS — M545 Low back pain: Secondary | ICD-10-CM | POA: Diagnosis not present

## 2019-02-16 DIAGNOSIS — M6281 Muscle weakness (generalized): Secondary | ICD-10-CM | POA: Diagnosis not present

## 2019-02-23 DIAGNOSIS — M62830 Muscle spasm of back: Secondary | ICD-10-CM | POA: Diagnosis not present

## 2019-02-23 DIAGNOSIS — M5441 Lumbago with sciatica, right side: Secondary | ICD-10-CM | POA: Diagnosis not present

## 2019-02-23 DIAGNOSIS — M9903 Segmental and somatic dysfunction of lumbar region: Secondary | ICD-10-CM | POA: Diagnosis not present

## 2019-02-23 DIAGNOSIS — M5442 Lumbago with sciatica, left side: Secondary | ICD-10-CM | POA: Diagnosis not present

## 2019-02-23 DIAGNOSIS — R262 Difficulty in walking, not elsewhere classified: Secondary | ICD-10-CM | POA: Diagnosis not present

## 2019-02-23 DIAGNOSIS — M6281 Muscle weakness (generalized): Secondary | ICD-10-CM | POA: Diagnosis not present

## 2019-02-23 DIAGNOSIS — M545 Low back pain: Secondary | ICD-10-CM | POA: Diagnosis not present

## 2019-02-23 DIAGNOSIS — M79605 Pain in left leg: Secondary | ICD-10-CM | POA: Diagnosis not present

## 2019-02-27 DIAGNOSIS — M545 Low back pain: Secondary | ICD-10-CM | POA: Diagnosis not present

## 2019-02-27 DIAGNOSIS — M62830 Muscle spasm of back: Secondary | ICD-10-CM | POA: Diagnosis not present

## 2019-02-27 DIAGNOSIS — M79605 Pain in left leg: Secondary | ICD-10-CM | POA: Diagnosis not present

## 2019-02-27 DIAGNOSIS — R262 Difficulty in walking, not elsewhere classified: Secondary | ICD-10-CM | POA: Diagnosis not present

## 2019-02-27 DIAGNOSIS — M5441 Lumbago with sciatica, right side: Secondary | ICD-10-CM | POA: Diagnosis not present

## 2019-02-27 DIAGNOSIS — M6281 Muscle weakness (generalized): Secondary | ICD-10-CM | POA: Diagnosis not present

## 2019-02-27 DIAGNOSIS — M9903 Segmental and somatic dysfunction of lumbar region: Secondary | ICD-10-CM | POA: Diagnosis not present

## 2019-02-27 DIAGNOSIS — M5442 Lumbago with sciatica, left side: Secondary | ICD-10-CM | POA: Diagnosis not present

## 2019-02-28 DIAGNOSIS — D2371 Other benign neoplasm of skin of right lower limb, including hip: Secondary | ICD-10-CM | POA: Diagnosis not present

## 2019-02-28 DIAGNOSIS — D225 Melanocytic nevi of trunk: Secondary | ICD-10-CM | POA: Diagnosis not present

## 2019-02-28 DIAGNOSIS — D1801 Hemangioma of skin and subcutaneous tissue: Secondary | ICD-10-CM | POA: Diagnosis not present

## 2019-02-28 DIAGNOSIS — Z85828 Personal history of other malignant neoplasm of skin: Secondary | ICD-10-CM | POA: Diagnosis not present

## 2019-02-28 DIAGNOSIS — L821 Other seborrheic keratosis: Secondary | ICD-10-CM | POA: Diagnosis not present

## 2019-03-01 DIAGNOSIS — M545 Low back pain: Secondary | ICD-10-CM | POA: Diagnosis not present

## 2019-03-01 DIAGNOSIS — R262 Difficulty in walking, not elsewhere classified: Secondary | ICD-10-CM | POA: Diagnosis not present

## 2019-03-01 DIAGNOSIS — M79605 Pain in left leg: Secondary | ICD-10-CM | POA: Diagnosis not present

## 2019-03-01 DIAGNOSIS — M6281 Muscle weakness (generalized): Secondary | ICD-10-CM | POA: Diagnosis not present

## 2019-03-02 DIAGNOSIS — M62830 Muscle spasm of back: Secondary | ICD-10-CM | POA: Diagnosis not present

## 2019-03-02 DIAGNOSIS — M9903 Segmental and somatic dysfunction of lumbar region: Secondary | ICD-10-CM | POA: Diagnosis not present

## 2019-03-02 DIAGNOSIS — M5442 Lumbago with sciatica, left side: Secondary | ICD-10-CM | POA: Diagnosis not present

## 2019-03-02 DIAGNOSIS — M5441 Lumbago with sciatica, right side: Secondary | ICD-10-CM | POA: Diagnosis not present

## 2019-03-07 DIAGNOSIS — J449 Chronic obstructive pulmonary disease, unspecified: Secondary | ICD-10-CM | POA: Diagnosis not present

## 2019-03-07 DIAGNOSIS — D509 Iron deficiency anemia, unspecified: Secondary | ICD-10-CM | POA: Diagnosis not present

## 2019-03-07 DIAGNOSIS — E782 Mixed hyperlipidemia: Secondary | ICD-10-CM | POA: Diagnosis not present

## 2019-03-07 DIAGNOSIS — I10 Essential (primary) hypertension: Secondary | ICD-10-CM | POA: Diagnosis not present

## 2019-03-07 DIAGNOSIS — E1140 Type 2 diabetes mellitus with diabetic neuropathy, unspecified: Secondary | ICD-10-CM | POA: Diagnosis not present

## 2019-03-07 DIAGNOSIS — N1831 Chronic kidney disease, stage 3a: Secondary | ICD-10-CM | POA: Diagnosis not present

## 2019-03-07 DIAGNOSIS — M179 Osteoarthritis of knee, unspecified: Secondary | ICD-10-CM | POA: Diagnosis not present

## 2019-03-14 ENCOUNTER — Other Ambulatory Visit: Payer: Self-pay | Admitting: Internal Medicine

## 2019-03-14 ENCOUNTER — Ambulatory Visit: Payer: PPO | Admitting: Cardiology

## 2019-03-14 DIAGNOSIS — N6099 Unspecified benign mammary dysplasia of unspecified breast: Secondary | ICD-10-CM

## 2019-03-15 ENCOUNTER — Other Ambulatory Visit: Payer: Self-pay | Admitting: Internal Medicine

## 2019-03-15 DIAGNOSIS — N6489 Other specified disorders of breast: Secondary | ICD-10-CM

## 2019-03-16 DIAGNOSIS — M1990 Unspecified osteoarthritis, unspecified site: Secondary | ICD-10-CM | POA: Diagnosis not present

## 2019-03-16 DIAGNOSIS — M179 Osteoarthritis of knee, unspecified: Secondary | ICD-10-CM | POA: Diagnosis not present

## 2019-03-16 DIAGNOSIS — D509 Iron deficiency anemia, unspecified: Secondary | ICD-10-CM | POA: Diagnosis not present

## 2019-03-16 DIAGNOSIS — E1140 Type 2 diabetes mellitus with diabetic neuropathy, unspecified: Secondary | ICD-10-CM | POA: Diagnosis not present

## 2019-03-16 DIAGNOSIS — J449 Chronic obstructive pulmonary disease, unspecified: Secondary | ICD-10-CM | POA: Diagnosis not present

## 2019-03-16 DIAGNOSIS — N1831 Chronic kidney disease, stage 3a: Secondary | ICD-10-CM | POA: Diagnosis not present

## 2019-03-16 DIAGNOSIS — I10 Essential (primary) hypertension: Secondary | ICD-10-CM | POA: Diagnosis not present

## 2019-03-16 DIAGNOSIS — E782 Mixed hyperlipidemia: Secondary | ICD-10-CM | POA: Diagnosis not present

## 2019-04-04 DIAGNOSIS — E782 Mixed hyperlipidemia: Secondary | ICD-10-CM | POA: Diagnosis not present

## 2019-04-04 DIAGNOSIS — I10 Essential (primary) hypertension: Secondary | ICD-10-CM | POA: Diagnosis not present

## 2019-04-04 DIAGNOSIS — M48061 Spinal stenosis, lumbar region without neurogenic claudication: Secondary | ICD-10-CM | POA: Diagnosis not present

## 2019-04-04 DIAGNOSIS — E1140 Type 2 diabetes mellitus with diabetic neuropathy, unspecified: Secondary | ICD-10-CM | POA: Diagnosis not present

## 2019-04-04 DIAGNOSIS — D509 Iron deficiency anemia, unspecified: Secondary | ICD-10-CM | POA: Diagnosis not present

## 2019-04-04 DIAGNOSIS — M545 Low back pain: Secondary | ICD-10-CM | POA: Diagnosis not present

## 2019-04-04 DIAGNOSIS — M179 Osteoarthritis of knee, unspecified: Secondary | ICD-10-CM | POA: Diagnosis not present

## 2019-04-04 DIAGNOSIS — M1990 Unspecified osteoarthritis, unspecified site: Secondary | ICD-10-CM | POA: Diagnosis not present

## 2019-04-04 DIAGNOSIS — N1831 Chronic kidney disease, stage 3a: Secondary | ICD-10-CM | POA: Diagnosis not present

## 2019-04-04 DIAGNOSIS — J449 Chronic obstructive pulmonary disease, unspecified: Secondary | ICD-10-CM | POA: Diagnosis not present

## 2019-04-12 DIAGNOSIS — N289 Disorder of kidney and ureter, unspecified: Secondary | ICD-10-CM | POA: Diagnosis not present

## 2019-04-12 DIAGNOSIS — M545 Low back pain: Secondary | ICD-10-CM | POA: Diagnosis not present

## 2019-04-12 DIAGNOSIS — M19041 Primary osteoarthritis, right hand: Secondary | ICD-10-CM | POA: Diagnosis not present

## 2019-04-12 DIAGNOSIS — M79672 Pain in left foot: Secondary | ICD-10-CM | POA: Diagnosis not present

## 2019-04-12 DIAGNOSIS — M064 Inflammatory polyarthropathy: Secondary | ICD-10-CM | POA: Diagnosis not present

## 2019-04-12 DIAGNOSIS — E119 Type 2 diabetes mellitus without complications: Secondary | ICD-10-CM | POA: Diagnosis not present

## 2019-04-12 DIAGNOSIS — M2550 Pain in unspecified joint: Secondary | ICD-10-CM | POA: Diagnosis not present

## 2019-04-12 DIAGNOSIS — M79641 Pain in right hand: Secondary | ICD-10-CM | POA: Diagnosis not present

## 2019-04-12 DIAGNOSIS — M19072 Primary osteoarthritis, left ankle and foot: Secondary | ICD-10-CM | POA: Diagnosis not present

## 2019-04-12 DIAGNOSIS — M19071 Primary osteoarthritis, right ankle and foot: Secondary | ICD-10-CM | POA: Diagnosis not present

## 2019-04-12 DIAGNOSIS — M1990 Unspecified osteoarthritis, unspecified site: Secondary | ICD-10-CM | POA: Diagnosis not present

## 2019-04-12 DIAGNOSIS — M79671 Pain in right foot: Secondary | ICD-10-CM | POA: Diagnosis not present

## 2019-04-12 DIAGNOSIS — M79642 Pain in left hand: Secondary | ICD-10-CM | POA: Diagnosis not present

## 2019-04-12 DIAGNOSIS — M19042 Primary osteoarthritis, left hand: Secondary | ICD-10-CM | POA: Diagnosis not present

## 2019-04-18 DIAGNOSIS — M5441 Lumbago with sciatica, right side: Secondary | ICD-10-CM | POA: Diagnosis not present

## 2019-04-18 DIAGNOSIS — M9903 Segmental and somatic dysfunction of lumbar region: Secondary | ICD-10-CM | POA: Diagnosis not present

## 2019-04-18 DIAGNOSIS — M62830 Muscle spasm of back: Secondary | ICD-10-CM | POA: Diagnosis not present

## 2019-04-18 DIAGNOSIS — M5442 Lumbago with sciatica, left side: Secondary | ICD-10-CM | POA: Diagnosis not present

## 2019-04-19 ENCOUNTER — Other Ambulatory Visit: Payer: Self-pay

## 2019-04-19 ENCOUNTER — Ambulatory Visit: Payer: PPO | Admitting: Cardiology

## 2019-04-19 ENCOUNTER — Encounter: Payer: Self-pay | Admitting: Cardiology

## 2019-04-19 VITALS — BP 122/62 | HR 85 | Ht 65.0 in | Wt 210.0 lb

## 2019-04-19 DIAGNOSIS — I10 Essential (primary) hypertension: Secondary | ICD-10-CM | POA: Diagnosis not present

## 2019-04-19 DIAGNOSIS — R002 Palpitations: Secondary | ICD-10-CM | POA: Diagnosis not present

## 2019-04-19 DIAGNOSIS — I700 Atherosclerosis of aorta: Secondary | ICD-10-CM | POA: Diagnosis not present

## 2019-04-19 NOTE — Patient Instructions (Signed)
Medication Instructions:   Your physician recommends that you continue on your current medications as directed. Please refer to the Current Medication list given to you today.  *If you need a refill on your cardiac medications before your next appointment, please call your pharmacy*    Follow-Up: At [facility] you and your health needs are our priority.  As part of our continuing mission to provide you with exceptional heart care, we have created designated Provider Care Teams.  These Care Teams include your primary Cardiologist (physician) and Advanced Practice Providers (APPs -  Physician Assistants and Nurse Practitioners) who all work together to provide you with the care you need, when you need it.  Your next appointment:   12 month(s)  The format for your next appointment:   In Person  Provider:   [name], MD

## 2019-04-19 NOTE — Progress Notes (Signed)
[facility]. [address], [address], [place]  [address] Phone: [phone] Fax:  [phone]  Date:  04/19/2019   ID:  Lori, [name] 02-Feb-1941, MRN [id number]  PCP:  [name], MD   History of Present Illness: Lori [name] is a 78 y.o. female for follow-up of aortic atherosclerosis.  Was seen prior to back surgery with Dr. Ronnald Ramp.  Previous visit was for follow up of dyspnea on exertion as well as evaluation of episodes of high heart rate at nighttime. This, dyspnea has been  ever since she had her knee surgery she states. Chest x-ray was unremarkable. Dr. Inda Merlin walked her around the office and she became quite short of breath but her oxygen saturations maintained at 98%. No wheezes, no obvious chest pain. She has not been having any claudication. While doing water aerobics for approximately an hour she feels fine.  Cardiac risk factors of diabetes and history of DVT in 2003. Her father had a heart attack in his 40s. Her hemoglobin was 12.4.   In regards to her fast heart rates at night, she is splitting her Toprol and doing quite well with this. This is improved.  Her shortness of breath is fairly similar but may be improved. She has been taking care of her husband who has had bypass surgery.  She has had a reassuring workup from pulmonary medicine as well as.   - 2012, 2014,2017 she underwent a nuclear stress test which was low risk, no ischemia.   - She's also previously undergone CT scan to rule out pulmonary embolism which was negative.   - Echocardiogram performed on 12/06/12 was overall reassuring. Normal EF.   - Cardiopulmonary stress test on 07/11/15 also reassuring  01/28/2018- preoperative risk stratification visit for back surgery.  Still able to perform water aerobics without anginal symptoms.  Nuclear stress test 2017 low risk, no ischemia.  Normal EF.  Cardiopulmonary stress test also reassuring.  Denies any chest pain fevers chills nausea vomiting  syncope bleeding.  Positive back pain.  04/19/2019-here for follow-up of dyspnea aortic atherosclerosis.  Doing very well.  Still able to do water aerobics since October even with Covid.  Her husband is having a cardioversion with me on Friday.  She ended up having a knee surgery in February, knee replacement that went very well.  Back surgery still with back pain.  Still having no problem with water aerobics for an hour but with heavy walking, she can still have dyspnea on exertion.  Denies any fevers chills nausea vomiting syncope bleeding.   Wt Readings from Last 3 Encounters:  04/19/19 210 lb (95.3 kg)  06/13/18 194 lb 3.6 oz (88.1 kg)  06/06/18 194 lb 7 oz (88.2 kg)     Past Medical History:  Diagnosis Date  . Anemia   . Arthritis   . Battery end of life of spinal cord stimulator 2016   battery replaced.   Marland Kitchen COPD (chronic obstructive pulmonary disease) ([place])    'Mild' per pt report  . Diabetes mellitus   . DVT (deep venous thrombosis) ([place])   . Dyspnea    on exertion  . GERD (gastroesophageal reflux disease)   . History of kidney stones   . History of stomach ulcers   . Hypertension    dr Marlou Porch  . Neuropathy   . Rapid heartbeat   . Sleep apnea    borderline study; could use a CPAP if desired.  Past Surgical History:  Procedure Laterality Date  . ABDOMINAL HYSTERECTOMY  1979 & 1981   ovaries and uterus removed at different times.  . APPENDECTOMY    . ARTHOSCOPIC ROTAOR CUFF REPAIR Left 2010 or 2011  . BACK SURGERY  x 2 1999 and 2008   lower back L 4 to L 5 L5 to S 1  . BREAST BIOPSY Bilateral 05/18/1997  . CARPAL TUNNEL RELEASE Right 2014  . CARPAL TUNNEL RELEASE Bilateral   . COLONOSCOPY WITH PROPOFOL N/A 10/25/2012   Procedure: COLONOSCOPY WITH PROPOFOL;  Surgeon: [name], MD;  Location: [facility];  Service: Endoscopy;  Laterality: N/A;  . EYE SURGERY     eyelid surgery  . JOINT REPLACEMENT Right 2012   knee  . KNEE ARTHROSCOPY Right 2004  .  LUMBAR LAMINECTOMY/DECOMPRESSION MICRODISCECTOMY Bilateral 03/03/2018   Procedure: Laminectomy and Foraminotomy - Lumbar one-Lumbar two - bilateral Posterior lateral fusion;  Surgeon: [name], MD;  Location: [facility];  Service: Neurosurgery;  Laterality: Bilateral;  . ovarian wedge resection  1966  . SPINAL CORD STIMULATOR INSERTION N/A 11/03/2012   Procedure: LUMBAR SPINAL CORD STIMULATOR INSERTION;  Surgeon: [name], MD;  Location: [facility];  Service: Orthopedics;  Laterality: N/A;  . SPINAL FUSION  10/2016  . TONSILLECTOMY  1960  . TOTAL KNEE ARTHROPLASTY Left 06/13/2018   Procedure: TOTAL KNEE ARTHROPLASTY;  Surgeon: Gaynelle Arabian, MD;  Location: [facility];  Service: Orthopedics;  Laterality: Left;  Adductor Block    Current Outpatient Medications  Medication Sig Dispense Refill  . albuterol (PROVENTIL HFA;VENTOLIN HFA) 108 (90 Base) MCG/ACT inhaler Inhale 1 puff into the lungs every 6 (six) hours as needed for wheezing or shortness of breath.    . estradiol (ESTRACE) 1 MG tablet Take 1 mg by mouth every evening.     . fexofenadine (ALLEGRA) 180 MG tablet Take 180 mg by mouth daily as needed for allergies or rhinitis.    . fluticasone furoate-vilanterol (BREO ELLIPTA) 100-25 MCG/INH AEPB Inhale 2 puffs into the lungs daily.     . furosemide (LASIX) 20 MG tablet Take 20 mg by mouth daily as needed for fluid.   3  . glipiZIDE (GLUCOTROL) 10 MG tablet Take 10 mg by mouth 2 (two) times daily.    . Insulin Glargine (TOUJEO SOLOSTAR) 300 UNIT/ML SOPN Inject 60 Units into the skin daily.     Marland Kitchen losartan (COZAAR) 100 MG tablet Take 100 mg by mouth every evening.     . meloxicam (MOBIC) 7.5 MG tablet Take 7.5 mg by mouth daily as needed for pain.    . metFORMIN (GLUCOPHAGE) 1000 MG tablet Take 1,000 mg by mouth 2 (two) times daily.     . metoprolol succinate (TOPROL-XL) 50 MG 24 hr tablet Take 1 tablet (50 mg total) by mouth 2 (two) times daily. 180 tablet 3  . omeprazole (PRILOSEC) 40 MG capsule  Take 40 mg by mouth daily.    . potassium chloride (K-DUR,KLOR-CON) 10 MEQ tablet Take 10 mEq by mouth daily.     . rosuvastatin (CRESTOR) 10 MG tablet Take 10 mg by mouth every evening.      No current facility-administered medications for this visit.     Allergies:    Allergies  Allergen Reactions  . Hydrochlorothiazide Anaphylaxis and Other (See Comments)    comatose  . Ace Inhibitors Cough  . Ezetimibe-Simvastatin Other (See Comments)    MYALGIA  . Indomethacin Other (See Comments)    PEPTIC ULCERS  .  Lisinopril Cough  . Lyrica [Pregabalin] Other (See Comments)    Weight gain, drowsy  . Prednisone Itching, Swelling and Other (See Comments)    Facial redness/swelling   . Simvastatin Other (See Comments)    Elevated LFT's  . Vioxx [Rofecoxib] Other (See Comments)    Anxiety & chills  . Troglitazone Other (See Comments)    Pt is unaware of reaction.  Marland Kitchen Antihistamines, Chlorpheniramine-Type Other (See Comments)    "HYPER"    Social History:  The patient  reports that she has never smoked. She has never used smokeless tobacco. She reports that she does not drink alcohol or use drugs.   Family History  Problem Relation Age of Onset  . Heart disease Father   . Diabetes Mother   . Breast cancer Daughter 34    ROS:  Please see the history of present illness.   All other review of systems negative PHYSICAL EXAM: VS:  BP 122/62   Pulse 85   Ht 5\' 5"  (1.651 m)   Wt 210 lb (95.3 kg)   LMP  (LMP Unknown)   BMI 34.95 kg/m  GEN: Well nourished, well developed, in no acute distress  HEENT: normal  Neck: no JVD, carotid bruits, or masses Cardiac: RRR; no murmurs, rubs, or gallops,no edema  Respiratory:  clear to auscultation bilaterally, normal work of breathing GI: soft, nontender, nondistended, + BS MS: no deformity or atrophy  Skin: warm and dry, no rash Neuro:  Alert and Oriented x 3, Strength and sensation are intact Psych: euthymic mood, full affect    EKG:   Today 04/19/2019-normal sinus rhythm 85 no other abnormalities 01/28/2018-sinus rhythm 81, septal infarct pattern, no other abnormalities, poor R wave progression personally examined and reviewed.  06/12/16-sinus rhythm 72 septal Q waves personally viewed-prior 05/14/15-sinus rhythm, 73, poor R-wave progression, septal infarct pattern personally viewed-no change from prior-previous 01/19/14-sinus rhythm, septal infarct pattern     ASSESSMENT AND PLAN:   Palpitations - Previous elevated heart rate, controlled with Toprol.  Doing well. No changes  Prior dyspnea - Prior cardiopulmonary stress test showed normal exercise capacity with no cardiac or pulmonary limitations, Dr. Vaughan Browner.  Nuclear stress test November 2017 also reassuring.  Continue with activity.  Hyperlipidemia - Continuing for now Crestor 10 mg.  LDL 113, HDL 78 previously.  Aortic atherosclerosis -Seen on prior myelogram.  On statin.  Blood pressure control.  No changes.  Obesity -Continue to work on weight loss.  Enjoys water aerobics.  No changes good job with weight loss.  One year follow-up.   Signed, [name], MD [facility]  04/19/2019 4:42 PM

## 2019-04-20 DIAGNOSIS — M9903 Segmental and somatic dysfunction of lumbar region: Secondary | ICD-10-CM | POA: Diagnosis not present

## 2019-04-20 DIAGNOSIS — M62830 Muscle spasm of back: Secondary | ICD-10-CM | POA: Diagnosis not present

## 2019-04-20 DIAGNOSIS — M5441 Lumbago with sciatica, right side: Secondary | ICD-10-CM | POA: Diagnosis not present

## 2019-04-20 DIAGNOSIS — M5442 Lumbago with sciatica, left side: Secondary | ICD-10-CM | POA: Diagnosis not present

## 2019-04-24 ENCOUNTER — Ambulatory Visit
Admission: RE | Admit: 2019-04-24 | Discharge: 2019-04-24 | Disposition: A | Discharge status: Home / Self Care | Payer: PPO | Source: Ambulatory Visit | Attending: Internal Medicine | Admitting: Internal Medicine

## 2019-04-24 ENCOUNTER — Other Ambulatory Visit: Payer: Self-pay

## 2019-04-24 DIAGNOSIS — R928 Other abnormal and inconclusive findings on diagnostic imaging of breast: Secondary | ICD-10-CM | POA: Diagnosis not present

## 2019-04-24 DIAGNOSIS — N6489 Other specified disorders of breast: Secondary | ICD-10-CM

## 2019-04-27 DIAGNOSIS — N289 Disorder of kidney and ureter, unspecified: Secondary | ICD-10-CM | POA: Diagnosis not present

## 2019-04-27 DIAGNOSIS — M064 Inflammatory polyarthropathy: Secondary | ICD-10-CM | POA: Diagnosis not present

## 2019-04-27 DIAGNOSIS — M2550 Pain in unspecified joint: Secondary | ICD-10-CM | POA: Diagnosis not present

## 2019-04-27 DIAGNOSIS — M545 Low back pain: Secondary | ICD-10-CM | POA: Diagnosis not present

## 2019-04-27 DIAGNOSIS — M1990 Unspecified osteoarthritis, unspecified site: Secondary | ICD-10-CM | POA: Diagnosis not present

## 2019-04-27 DIAGNOSIS — E669 Obesity, unspecified: Secondary | ICD-10-CM | POA: Diagnosis not present

## 2019-04-27 DIAGNOSIS — E119 Type 2 diabetes mellitus without complications: Secondary | ICD-10-CM | POA: Diagnosis not present

## 2019-05-08 DIAGNOSIS — M25572 Pain in left ankle and joints of left foot: Secondary | ICD-10-CM | POA: Diagnosis not present

## 2019-05-08 DIAGNOSIS — M533 Sacrococcygeal disorders, not elsewhere classified: Secondary | ICD-10-CM | POA: Diagnosis not present

## 2019-05-09 DIAGNOSIS — J449 Chronic obstructive pulmonary disease, unspecified: Secondary | ICD-10-CM | POA: Diagnosis not present

## 2019-05-09 DIAGNOSIS — I10 Essential (primary) hypertension: Secondary | ICD-10-CM | POA: Diagnosis not present

## 2019-05-09 DIAGNOSIS — N1831 Chronic kidney disease, stage 3a: Secondary | ICD-10-CM | POA: Diagnosis not present

## 2019-05-09 DIAGNOSIS — E1140 Type 2 diabetes mellitus with diabetic neuropathy, unspecified: Secondary | ICD-10-CM | POA: Diagnosis not present

## 2019-05-09 DIAGNOSIS — M1990 Unspecified osteoarthritis, unspecified site: Secondary | ICD-10-CM | POA: Diagnosis not present

## 2019-05-09 DIAGNOSIS — E782 Mixed hyperlipidemia: Secondary | ICD-10-CM | POA: Diagnosis not present

## 2019-05-09 DIAGNOSIS — D509 Iron deficiency anemia, unspecified: Secondary | ICD-10-CM | POA: Diagnosis not present

## 2019-05-09 DIAGNOSIS — M179 Osteoarthritis of knee, unspecified: Secondary | ICD-10-CM | POA: Diagnosis not present

## 2019-05-15 DIAGNOSIS — M7662 Achilles tendinitis, left leg: Secondary | ICD-10-CM | POA: Insufficient documentation

## 2019-05-18 DIAGNOSIS — M545 Low back pain: Secondary | ICD-10-CM | POA: Diagnosis not present

## 2019-05-23 DIAGNOSIS — M545 Low back pain: Secondary | ICD-10-CM | POA: Diagnosis not present

## 2019-05-23 DIAGNOSIS — M7662 Achilles tendinitis, left leg: Secondary | ICD-10-CM | POA: Diagnosis not present

## 2019-05-25 DIAGNOSIS — M7662 Achilles tendinitis, left leg: Secondary | ICD-10-CM | POA: Diagnosis not present

## 2019-05-25 DIAGNOSIS — M545 Low back pain: Secondary | ICD-10-CM | POA: Diagnosis not present

## 2019-05-29 DIAGNOSIS — M179 Osteoarthritis of knee, unspecified: Secondary | ICD-10-CM | POA: Diagnosis not present

## 2019-05-29 DIAGNOSIS — M1990 Unspecified osteoarthritis, unspecified site: Secondary | ICD-10-CM | POA: Diagnosis not present

## 2019-05-29 DIAGNOSIS — D509 Iron deficiency anemia, unspecified: Secondary | ICD-10-CM | POA: Diagnosis not present

## 2019-05-29 DIAGNOSIS — E1140 Type 2 diabetes mellitus with diabetic neuropathy, unspecified: Secondary | ICD-10-CM | POA: Diagnosis not present

## 2019-05-29 DIAGNOSIS — I10 Essential (primary) hypertension: Secondary | ICD-10-CM | POA: Diagnosis not present

## 2019-05-29 DIAGNOSIS — E782 Mixed hyperlipidemia: Secondary | ICD-10-CM | POA: Diagnosis not present

## 2019-05-29 DIAGNOSIS — N1831 Chronic kidney disease, stage 3a: Secondary | ICD-10-CM | POA: Diagnosis not present

## 2019-05-29 DIAGNOSIS — J449 Chronic obstructive pulmonary disease, unspecified: Secondary | ICD-10-CM | POA: Diagnosis not present

## 2019-05-30 DIAGNOSIS — M7662 Achilles tendinitis, left leg: Secondary | ICD-10-CM | POA: Diagnosis not present

## 2019-05-30 DIAGNOSIS — M545 Low back pain: Secondary | ICD-10-CM | POA: Diagnosis not present

## 2019-05-31 DIAGNOSIS — M533 Sacrococcygeal disorders, not elsewhere classified: Secondary | ICD-10-CM | POA: Diagnosis not present

## 2019-06-01 DIAGNOSIS — M545 Low back pain: Secondary | ICD-10-CM | POA: Diagnosis not present

## 2019-06-01 DIAGNOSIS — M7662 Achilles tendinitis, left leg: Secondary | ICD-10-CM | POA: Diagnosis not present

## 2019-06-05 DIAGNOSIS — M25512 Pain in left shoulder: Secondary | ICD-10-CM | POA: Diagnosis not present

## 2019-06-05 DIAGNOSIS — M19019 Primary osteoarthritis, unspecified shoulder: Secondary | ICD-10-CM | POA: Diagnosis not present

## 2019-06-05 DIAGNOSIS — Z9889 Other specified postprocedural states: Secondary | ICD-10-CM | POA: Diagnosis not present

## 2019-06-05 DIAGNOSIS — M25511 Pain in right shoulder: Secondary | ICD-10-CM | POA: Diagnosis not present

## 2019-06-06 DIAGNOSIS — M545 Low back pain: Secondary | ICD-10-CM | POA: Diagnosis not present

## 2019-06-06 DIAGNOSIS — M7662 Achilles tendinitis, left leg: Secondary | ICD-10-CM | POA: Diagnosis not present

## 2019-06-08 DIAGNOSIS — M545 Low back pain: Secondary | ICD-10-CM | POA: Diagnosis not present

## 2019-06-08 DIAGNOSIS — M7662 Achilles tendinitis, left leg: Secondary | ICD-10-CM | POA: Diagnosis not present

## 2019-06-08 DIAGNOSIS — Z96652 Presence of left artificial knee joint: Secondary | ICD-10-CM | POA: Diagnosis not present

## 2019-06-08 DIAGNOSIS — Z96653 Presence of artificial knee joint, bilateral: Secondary | ICD-10-CM | POA: Diagnosis not present

## 2019-06-13 DIAGNOSIS — M7662 Achilles tendinitis, left leg: Secondary | ICD-10-CM | POA: Diagnosis not present

## 2019-06-13 DIAGNOSIS — M545 Low back pain: Secondary | ICD-10-CM | POA: Diagnosis not present

## 2019-06-15 DIAGNOSIS — M545 Low back pain: Secondary | ICD-10-CM | POA: Diagnosis not present

## 2019-06-15 DIAGNOSIS — M7662 Achilles tendinitis, left leg: Secondary | ICD-10-CM | POA: Diagnosis not present

## 2019-06-20 DIAGNOSIS — M7662 Achilles tendinitis, left leg: Secondary | ICD-10-CM | POA: Diagnosis not present

## 2019-06-20 DIAGNOSIS — M545 Low back pain: Secondary | ICD-10-CM | POA: Diagnosis not present

## 2019-06-23 DIAGNOSIS — M545 Low back pain: Secondary | ICD-10-CM | POA: Diagnosis not present

## 2019-06-23 DIAGNOSIS — M7662 Achilles tendinitis, left leg: Secondary | ICD-10-CM | POA: Diagnosis not present

## 2019-06-26 DIAGNOSIS — M62830 Muscle spasm of back: Secondary | ICD-10-CM | POA: Diagnosis not present

## 2019-06-26 DIAGNOSIS — M5441 Lumbago with sciatica, right side: Secondary | ICD-10-CM | POA: Diagnosis not present

## 2019-06-26 DIAGNOSIS — M5442 Lumbago with sciatica, left side: Secondary | ICD-10-CM | POA: Diagnosis not present

## 2019-06-26 DIAGNOSIS — M9903 Segmental and somatic dysfunction of lumbar region: Secondary | ICD-10-CM | POA: Diagnosis not present

## 2019-06-27 DIAGNOSIS — M545 Low back pain: Secondary | ICD-10-CM | POA: Diagnosis not present

## 2019-06-27 DIAGNOSIS — M7662 Achilles tendinitis, left leg: Secondary | ICD-10-CM | POA: Diagnosis not present

## 2019-07-04 DIAGNOSIS — M545 Low back pain: Secondary | ICD-10-CM | POA: Diagnosis not present

## 2019-07-04 DIAGNOSIS — M7662 Achilles tendinitis, left leg: Secondary | ICD-10-CM | POA: Diagnosis not present

## 2019-07-05 DIAGNOSIS — M66862 Spontaneous rupture of other tendons, left lower leg: Secondary | ICD-10-CM | POA: Diagnosis not present

## 2019-07-07 DIAGNOSIS — I10 Essential (primary) hypertension: Secondary | ICD-10-CM | POA: Diagnosis not present

## 2019-07-07 DIAGNOSIS — M1990 Unspecified osteoarthritis, unspecified site: Secondary | ICD-10-CM | POA: Diagnosis not present

## 2019-07-07 DIAGNOSIS — N183 Chronic kidney disease, stage 3 unspecified: Secondary | ICD-10-CM | POA: Diagnosis not present

## 2019-07-07 DIAGNOSIS — M179 Osteoarthritis of knee, unspecified: Secondary | ICD-10-CM | POA: Diagnosis not present

## 2019-07-07 DIAGNOSIS — D509 Iron deficiency anemia, unspecified: Secondary | ICD-10-CM | POA: Diagnosis not present

## 2019-07-07 DIAGNOSIS — E1140 Type 2 diabetes mellitus with diabetic neuropathy, unspecified: Secondary | ICD-10-CM | POA: Diagnosis not present

## 2019-07-07 DIAGNOSIS — J449 Chronic obstructive pulmonary disease, unspecified: Secondary | ICD-10-CM | POA: Diagnosis not present

## 2019-07-07 DIAGNOSIS — E782 Mixed hyperlipidemia: Secondary | ICD-10-CM | POA: Diagnosis not present

## 2019-07-17 DIAGNOSIS — J449 Chronic obstructive pulmonary disease, unspecified: Secondary | ICD-10-CM | POA: Diagnosis not present

## 2019-07-17 DIAGNOSIS — Z7984 Long term (current) use of oral hypoglycemic drugs: Secondary | ICD-10-CM | POA: Diagnosis not present

## 2019-07-17 DIAGNOSIS — M1990 Unspecified osteoarthritis, unspecified site: Secondary | ICD-10-CM | POA: Diagnosis not present

## 2019-07-17 DIAGNOSIS — D509 Iron deficiency anemia, unspecified: Secondary | ICD-10-CM | POA: Diagnosis not present

## 2019-07-17 DIAGNOSIS — E1140 Type 2 diabetes mellitus with diabetic neuropathy, unspecified: Secondary | ICD-10-CM | POA: Diagnosis not present

## 2019-07-17 DIAGNOSIS — E782 Mixed hyperlipidemia: Secondary | ICD-10-CM | POA: Diagnosis not present

## 2019-07-17 DIAGNOSIS — M66862 Spontaneous rupture of other tendons, left lower leg: Secondary | ICD-10-CM | POA: Diagnosis not present

## 2019-07-17 DIAGNOSIS — I10 Essential (primary) hypertension: Secondary | ICD-10-CM | POA: Diagnosis not present

## 2019-07-17 DIAGNOSIS — G4700 Insomnia, unspecified: Secondary | ICD-10-CM | POA: Diagnosis not present

## 2019-07-17 DIAGNOSIS — M179 Osteoarthritis of knee, unspecified: Secondary | ICD-10-CM | POA: Diagnosis not present

## 2019-07-17 DIAGNOSIS — N183 Chronic kidney disease, stage 3 unspecified: Secondary | ICD-10-CM | POA: Diagnosis not present

## 2019-07-25 ENCOUNTER — Other Ambulatory Visit: Payer: Self-pay | Admitting: *Deleted

## 2019-07-25 DIAGNOSIS — E119 Type 2 diabetes mellitus without complications: Secondary | ICD-10-CM

## 2019-07-25 NOTE — Patient Outreach (Signed)
[facility]) Care Management Chronic Special Needs Program    07/25/2019  Name: [name], DOB: 02-Aug-1940  MRN: [id number]   Ms. [name] is enrolled in a chronic special needs plan for Diabetes.  Health risk assessment completed previously by client.  Interdisciplinary care plan created from information in electronic medical record.  Client also has history of HTN and lung problems.  RN care manager will send introductory letter with interdisciplinary care plan to primary care provider and client, along with education materials.  Assigned RN care manager will follow up with client within 3-4 months.  Goals    . Client understands the importance of follow-up with providers by attending scheduled visits     Client saw primary care provider 03/07/19, 03/16/19, 04/04/19 and 05/09/19 Client saw cardiologist 04/19/19 Continue to attend all appointments and schedule as needed    . Client will verbalize knowledge of chronic lung disease as evidenced by no ED visits or Inpatient stays related to chronic lung disease      Per review of electronic medical record, pt has had no ED visits/ hospitalizations related to lung disease. RN care manager will send EMMI education article "COPD, what you can do" Continue to follow up with primary care provider    . Client will verbalize knowledge of self management of Hypertension as evidences by BP reading of 140/90 or less; or as defined by provider     Blood pressure reading from 04/19/19 (cardiology visit) 122/62 RN care manager will mail EMMI education article "Hypertension, what you can do" Take medications as prescribed and recommended by your provider. Please ask your provider "what is my target blood pressure range" Monitor your blood pressure and take results to our doctor's appointment    . HEMOGLOBIN A1C < 7.0     Per medical record review, Hgb AIC 6.9 on 10/13/18 RN care manager will mail EMMI education article  "Diabetes care checklist" and "Low blood sugar in people with diabetes" Monitor glucose per provider recommendation Check feet daily Hgb AIC every 3-6 months Eye exam yearly Carbohydrate controlled meal planning Take diabetes medication as prescribed by provider Physical activity- discuss exercise plan with provider    . Maintain timely refills of diabetic medication as prescribed within the year .     Continue to take medications as prescribed and refill on time RN care manager placed order for [facility] pharmacist to outreach client for medication review    . Obtain annual  Lipid Profile, LDL-C     Lipid profile completed on 10/13/18 LDL- 106 The goal for LDL is less than 70 mg/dl as you are at high risk for complications, try to avoid saturated fats, trans-fats and eat more fiber. Continue to see primary care provider and have scheduled lab work completed    . Obtain Annual Eye (retinal)  Exam      Per electronic medical record, eye exam completed 02/08/2018 Please keep and / or schedule follow up appointment with eye doctor It is important to have yearly diabetic eye eyxam    . Obtain Annual Foot Exam     Per health risk assessment, client reported foot exam twice yearly Continue to have feet checked at primary care provider visits Please check your feet daily and report any changes to primary care provider    . Obtain annual screen for micro albuminuria (urine) , nephropathy (kidney problems)     Microalbumin urine completed 10/13/18  It is important to follow up with primary  care provider for yearly physicals and labs and have your urine checked for protein at least every year     . Obtain Hemoglobin A1C at least 2 times per year     Hgb AIC completed on 10/13/18   Result 6.9 per medical record review Continue to keep your follow up appointments with your provider and have lab work completed as recommended    . Visit Primary Care Provider or Endocrinologist at least 2 times per year        Review of medical record indicates client completed at least 4 office visits with primary care provider in 2020. Please call and schedule yearly physical with primary care provider        [name] [facility] [place] Coordinator [phone]

## 2019-08-02 DIAGNOSIS — S86012D Strain of left Achilles tendon, subsequent encounter: Secondary | ICD-10-CM | POA: Diagnosis not present

## 2019-08-03 DIAGNOSIS — S86009A Unspecified injury of unspecified Achilles tendon, initial encounter: Secondary | ICD-10-CM | POA: Diagnosis not present

## 2019-08-03 DIAGNOSIS — J449 Chronic obstructive pulmonary disease, unspecified: Secondary | ICD-10-CM | POA: Diagnosis not present

## 2019-08-03 DIAGNOSIS — M179 Osteoarthritis of knee, unspecified: Secondary | ICD-10-CM | POA: Diagnosis not present

## 2019-08-03 DIAGNOSIS — D509 Iron deficiency anemia, unspecified: Secondary | ICD-10-CM | POA: Diagnosis not present

## 2019-08-03 DIAGNOSIS — I10 Essential (primary) hypertension: Secondary | ICD-10-CM | POA: Diagnosis not present

## 2019-08-03 DIAGNOSIS — M545 Low back pain: Secondary | ICD-10-CM | POA: Diagnosis not present

## 2019-08-03 DIAGNOSIS — E782 Mixed hyperlipidemia: Secondary | ICD-10-CM | POA: Diagnosis not present

## 2019-08-03 DIAGNOSIS — E1140 Type 2 diabetes mellitus with diabetic neuropathy, unspecified: Secondary | ICD-10-CM | POA: Diagnosis not present

## 2019-08-03 DIAGNOSIS — Z658 Other specified problems related to psychosocial circumstances: Secondary | ICD-10-CM | POA: Diagnosis not present

## 2019-08-16 DIAGNOSIS — S86012D Strain of left Achilles tendon, subsequent encounter: Secondary | ICD-10-CM | POA: Diagnosis not present

## 2019-08-23 ENCOUNTER — Other Ambulatory Visit: Payer: Self-pay | Admitting: *Deleted

## 2019-08-23 ENCOUNTER — Encounter: Payer: Self-pay | Admitting: *Deleted

## 2019-08-23 NOTE — Patient Outreach (Addendum)
[facility]) Care Management Chronic Special Needs Program  08/23/2019  Name: [name] DOB: [birth year]  MRN: [id number]  [name] is enrolled in a chronic special needs plan for Diabetes. Chronic Care Management Coordinator telephoned client to review health risk assessment and to develop individualized care plan.  Introduced the chronic care management program, importance of client participation, and taking their care plan to all provider appointments and inpatient facilities.  Reviewed the transition of care process and possible referral to community care management.  Subjective: Outreach call to client, no answer to telephone, left voicemail requesting return phone call, client called RN care manager back.  Per client, she lives with spouse and is independent with self care, client checks blood sugar BID with fasting ranges 70-110 and random ranges 140-200's. Client states she sees orthopedic specialist for left achilles tendon/ pain, states hydrocodone does help the pain, client states she is able to manage medications and has no questions, reports [facility] "helps me if I ever need it"  Client states she had nurse from "house calls" come out today and AIC checked at 6.2.  Client requests HTA calendar and magnet as she cannot find her.  Client feels she is managing well with diabetes. Client reports she tries to be mindful of carbohydrates and the impact on her blood sugar.  Goals Addressed            This Visit's Progress   . "just staying alive" (pt-stated)       Continue to take all medications as prescribed Follow up with your health care provider Incorporate physical activity, continue yoga Eat healthy diet including lean protein, fruits and vegetables with fiber Practice relaxation for improved pain control Take pain medication as needed    . Client understands the importance of follow-up with providers by attending scheduled visits   On  track    Client saw primary care provider 03/07/19, 03/16/19, 04/04/19 and 05/09/19 and 07/2019 Client saw cardiologist 04/19/19 Continue to attend all appointments and schedule as needed    . COMPLETED: Client will verbalize knowledge of chronic lung disease as evidenced by no ED visits or Inpatient stays related to chronic lung disease       . Client will verbalize knowledge of self management of Hypertension as evidences by BP reading of 140/90 or less; or as defined by provider   On track    Blood pressure reading from 04/19/19 (cardiology visit) 122/62 Take medications as prescribed and recommended by your provider. Please ask your provider "what is my target blood pressure range" Monitor your blood pressure and take results to our doctor's appointment Plan to follow a low salt diet. Increase activity as tolerated. Follow up with your health care provider as recommended     . HEMOGLOBIN A1C < 7.0       Per medical record review, Hgb AIC 6.9 on 10/13/18 and 6.2 08/23/19 Monitor glucose per provider recommendation Check feet daily Hgb AIC every 3-6 months Eye exam yearly Carbohydrate controlled meal planning and plate method Take diabetes medication as prescribed by provider Physical activity- discuss exercise plan with provider    . Maintain timely refills of diabetic medication as prescribed within the year .   On track    Continue to take medications as prescribed and refill on time Follow up with your health care provider if you have any questions. Please contact your assigned RN care manager if you have difficulty obtaining medications. Review of electronic medical  record medication dispense report indicates client maintains timely refills of diabetic medications.     . Obtain annual  Lipid Profile, LDL-C   On track    Lipid profile completed on 10/13/18 LDL- 106 The goal for LDL is less than 70 mg/dl as you are at high risk for complications, try to avoid saturated fats, trans-fats  and eat more fiber. Continue to see primary care provider and have scheduled lab work completed    . Obtain Annual Eye (retinal)  Exam    On track    Client reports eye exam September 2020 Please keep and / or schedule follow up appointment with eye doctor It is important to have yearly diabetic eye eyxam    . Obtain Annual Foot Exam   On track    Per health risk assessment, client reported foot exam twice yearly Client reports foot exam 11/2018 Continue to have feet checked at primary care provider visits Please check your feet daily and report any changes to primary care provider Your doctor should check your feet at least once a year. Plan to schedule a foot exam with your health care provider once every year. Check your skin and feet daily for cuts, bruises, redness, blisters or sore.     . Obtain annual screen for micro albuminuria (urine) , nephropathy (kidney problems)   On track    Microalbumin urine completed 10/13/18  It is important to follow up with primary care provider for yearly physicals and labs and have your urine checked for protein at least every year     . Obtain Hemoglobin A1C at least 2 times per year   On track    Hgb AIC completed on 10/13/18  and 08/23/19 = 6.2  Continue to keep your follow up appointments with your provider and have lab work completed as recommended    . Visit Primary Care Provider or Endocrinologist at least 2 times per year    On track    Review of medical record indicates client completed at least 4 office visits with primary care provider in 2020 and one in 2021 Please call and schedule yearly physical with primary care provider       Plan:  RN care manager faxed today's note with updated individualized care plan to primary care provider, mailed successful outreach letter including updated individualized care plan to client's home with enclosures : HTA calendar, magnet and consent form.  Chronic care management coordination will outreach  in:  10-12 months   Kassie Mends Nursing/RN [place] Case Manager, C-SNP  [phone]

## 2019-08-23 NOTE — Patient Outreach (Deleted)
  [facility]) Care Management Chronic Special Needs Program    08/23/2019  Name: [name], DOB: December 16, 1940  MRN: [id number]   Ms. [name] is enrolled in a chronic special needs plan for Diabetes. Initial outreach call to client with no answer to telephone, left voicemail requesting return phone call.  PLAN Outreach client within 2 weeks  [name] [facility], [place] Coordinator [phone]

## 2019-08-30 DIAGNOSIS — S86012D Strain of left Achilles tendon, subsequent encounter: Secondary | ICD-10-CM | POA: Diagnosis not present

## 2019-09-01 ENCOUNTER — Ambulatory Visit: Payer: HMO | Admitting: *Deleted

## 2019-09-05 DIAGNOSIS — Z7984 Long term (current) use of oral hypoglycemic drugs: Secondary | ICD-10-CM | POA: Diagnosis not present

## 2019-09-05 DIAGNOSIS — I10 Essential (primary) hypertension: Secondary | ICD-10-CM | POA: Diagnosis not present

## 2019-09-05 DIAGNOSIS — M179 Osteoarthritis of knee, unspecified: Secondary | ICD-10-CM | POA: Diagnosis not present

## 2019-09-05 DIAGNOSIS — E782 Mixed hyperlipidemia: Secondary | ICD-10-CM | POA: Diagnosis not present

## 2019-09-05 DIAGNOSIS — D509 Iron deficiency anemia, unspecified: Secondary | ICD-10-CM | POA: Diagnosis not present

## 2019-09-05 DIAGNOSIS — E1140 Type 2 diabetes mellitus with diabetic neuropathy, unspecified: Secondary | ICD-10-CM | POA: Diagnosis not present

## 2019-09-05 DIAGNOSIS — M1990 Unspecified osteoarthritis, unspecified site: Secondary | ICD-10-CM | POA: Diagnosis not present

## 2019-09-05 DIAGNOSIS — G4700 Insomnia, unspecified: Secondary | ICD-10-CM | POA: Diagnosis not present

## 2019-09-05 DIAGNOSIS — N183 Chronic kidney disease, stage 3 unspecified: Secondary | ICD-10-CM | POA: Diagnosis not present

## 2019-09-05 DIAGNOSIS — J449 Chronic obstructive pulmonary disease, unspecified: Secondary | ICD-10-CM | POA: Diagnosis not present

## 2019-09-08 ENCOUNTER — Other Ambulatory Visit: Payer: Self-pay | Admitting: Internal Medicine

## 2019-09-08 DIAGNOSIS — Z1231 Encounter for screening mammogram for malignant neoplasm of breast: Secondary | ICD-10-CM

## 2019-09-11 DIAGNOSIS — M7662 Achilles tendinitis, left leg: Secondary | ICD-10-CM | POA: Diagnosis not present

## 2019-09-11 DIAGNOSIS — M545 Low back pain: Secondary | ICD-10-CM | POA: Diagnosis not present

## 2019-09-14 DIAGNOSIS — M545 Low back pain: Secondary | ICD-10-CM | POA: Diagnosis not present

## 2019-09-14 DIAGNOSIS — M7662 Achilles tendinitis, left leg: Secondary | ICD-10-CM | POA: Diagnosis not present

## 2019-09-18 DIAGNOSIS — M545 Low back pain: Secondary | ICD-10-CM | POA: Diagnosis not present

## 2019-09-18 DIAGNOSIS — M7662 Achilles tendinitis, left leg: Secondary | ICD-10-CM | POA: Diagnosis not present

## 2019-09-21 DIAGNOSIS — M545 Low back pain: Secondary | ICD-10-CM | POA: Diagnosis not present

## 2019-09-21 DIAGNOSIS — M7662 Achilles tendinitis, left leg: Secondary | ICD-10-CM | POA: Diagnosis not present

## 2019-09-26 DIAGNOSIS — M545 Low back pain: Secondary | ICD-10-CM | POA: Diagnosis not present

## 2019-09-26 DIAGNOSIS — M7662 Achilles tendinitis, left leg: Secondary | ICD-10-CM | POA: Diagnosis not present

## 2019-09-27 DIAGNOSIS — S86012D Strain of left Achilles tendon, subsequent encounter: Secondary | ICD-10-CM | POA: Diagnosis not present

## 2019-09-29 DIAGNOSIS — M545 Low back pain: Secondary | ICD-10-CM | POA: Diagnosis not present

## 2019-09-29 DIAGNOSIS — M7662 Achilles tendinitis, left leg: Secondary | ICD-10-CM | POA: Diagnosis not present

## 2019-10-03 DIAGNOSIS — M7662 Achilles tendinitis, left leg: Secondary | ICD-10-CM | POA: Diagnosis not present

## 2019-10-03 DIAGNOSIS — M545 Low back pain: Secondary | ICD-10-CM | POA: Diagnosis not present

## 2019-10-05 DIAGNOSIS — M7662 Achilles tendinitis, left leg: Secondary | ICD-10-CM | POA: Diagnosis not present

## 2019-10-05 DIAGNOSIS — M545 Low back pain: Secondary | ICD-10-CM | POA: Diagnosis not present

## 2019-10-06 DIAGNOSIS — G4700 Insomnia, unspecified: Secondary | ICD-10-CM | POA: Diagnosis not present

## 2019-10-06 DIAGNOSIS — E782 Mixed hyperlipidemia: Secondary | ICD-10-CM | POA: Diagnosis not present

## 2019-10-06 DIAGNOSIS — D509 Iron deficiency anemia, unspecified: Secondary | ICD-10-CM | POA: Diagnosis not present

## 2019-10-06 DIAGNOSIS — I10 Essential (primary) hypertension: Secondary | ICD-10-CM | POA: Diagnosis not present

## 2019-10-06 DIAGNOSIS — J449 Chronic obstructive pulmonary disease, unspecified: Secondary | ICD-10-CM | POA: Diagnosis not present

## 2019-10-06 DIAGNOSIS — M179 Osteoarthritis of knee, unspecified: Secondary | ICD-10-CM | POA: Diagnosis not present

## 2019-10-06 DIAGNOSIS — N183 Chronic kidney disease, stage 3 unspecified: Secondary | ICD-10-CM | POA: Diagnosis not present

## 2019-10-06 DIAGNOSIS — E1140 Type 2 diabetes mellitus with diabetic neuropathy, unspecified: Secondary | ICD-10-CM | POA: Diagnosis not present

## 2019-10-06 DIAGNOSIS — M1990 Unspecified osteoarthritis, unspecified site: Secondary | ICD-10-CM | POA: Diagnosis not present

## 2019-10-10 DIAGNOSIS — M7662 Achilles tendinitis, left leg: Secondary | ICD-10-CM | POA: Diagnosis not present

## 2019-10-10 DIAGNOSIS — M545 Low back pain: Secondary | ICD-10-CM | POA: Diagnosis not present

## 2019-10-12 DIAGNOSIS — M545 Low back pain: Secondary | ICD-10-CM | POA: Diagnosis not present

## 2019-10-12 DIAGNOSIS — M7662 Achilles tendinitis, left leg: Secondary | ICD-10-CM | POA: Diagnosis not present

## 2019-10-17 ENCOUNTER — Other Ambulatory Visit: Payer: Self-pay

## 2019-10-17 ENCOUNTER — Ambulatory Visit
Admission: RE | Admit: 2019-10-17 | Discharge: 2019-10-17 | Disposition: A | Discharge status: Home / Self Care | Payer: HMO | Source: Ambulatory Visit | Attending: Internal Medicine | Admitting: Internal Medicine

## 2019-10-17 DIAGNOSIS — M1990 Unspecified osteoarthritis, unspecified site: Secondary | ICD-10-CM | POA: Diagnosis not present

## 2019-10-17 DIAGNOSIS — Z1231 Encounter for screening mammogram for malignant neoplasm of breast: Secondary | ICD-10-CM

## 2019-10-17 DIAGNOSIS — M7662 Achilles tendinitis, left leg: Secondary | ICD-10-CM | POA: Diagnosis not present

## 2019-10-17 DIAGNOSIS — M179 Osteoarthritis of knee, unspecified: Secondary | ICD-10-CM | POA: Diagnosis not present

## 2019-10-17 DIAGNOSIS — E1140 Type 2 diabetes mellitus with diabetic neuropathy, unspecified: Secondary | ICD-10-CM | POA: Diagnosis not present

## 2019-10-17 DIAGNOSIS — E559 Vitamin D deficiency, unspecified: Secondary | ICD-10-CM | POA: Diagnosis not present

## 2019-10-17 DIAGNOSIS — D509 Iron deficiency anemia, unspecified: Secondary | ICD-10-CM | POA: Diagnosis not present

## 2019-10-17 DIAGNOSIS — Z1389 Encounter for screening for other disorder: Secondary | ICD-10-CM | POA: Diagnosis not present

## 2019-10-17 DIAGNOSIS — N183 Chronic kidney disease, stage 3 unspecified: Secondary | ICD-10-CM | POA: Diagnosis not present

## 2019-10-17 DIAGNOSIS — I10 Essential (primary) hypertension: Secondary | ICD-10-CM | POA: Diagnosis not present

## 2019-10-17 DIAGNOSIS — G894 Chronic pain syndrome: Secondary | ICD-10-CM | POA: Diagnosis not present

## 2019-10-17 DIAGNOSIS — M545 Low back pain: Secondary | ICD-10-CM | POA: Diagnosis not present

## 2019-10-17 DIAGNOSIS — J449 Chronic obstructive pulmonary disease, unspecified: Secondary | ICD-10-CM | POA: Diagnosis not present

## 2019-10-17 DIAGNOSIS — E782 Mixed hyperlipidemia: Secondary | ICD-10-CM | POA: Diagnosis not present

## 2019-10-17 DIAGNOSIS — Z0001 Encounter for general adult medical examination with abnormal findings: Secondary | ICD-10-CM | POA: Diagnosis not present

## 2019-10-18 DIAGNOSIS — M533 Sacrococcygeal disorders, not elsewhere classified: Secondary | ICD-10-CM | POA: Diagnosis not present

## 2019-10-19 ENCOUNTER — Other Ambulatory Visit: Payer: Self-pay | Admitting: Internal Medicine

## 2019-10-19 DIAGNOSIS — M545 Low back pain: Secondary | ICD-10-CM | POA: Diagnosis not present

## 2019-10-19 DIAGNOSIS — R928 Other abnormal and inconclusive findings on diagnostic imaging of breast: Secondary | ICD-10-CM

## 2019-10-19 DIAGNOSIS — M7662 Achilles tendinitis, left leg: Secondary | ICD-10-CM | POA: Diagnosis not present

## 2019-10-23 ENCOUNTER — Other Ambulatory Visit: Payer: Self-pay | Admitting: Podiatry

## 2019-10-23 ENCOUNTER — Encounter: Payer: Self-pay | Admitting: Podiatry

## 2019-10-23 ENCOUNTER — Ambulatory Visit (INDEPENDENT_AMBULATORY_CARE_PROVIDER_SITE_OTHER): Payer: HMO

## 2019-10-23 ENCOUNTER — Ambulatory Visit: Payer: HMO | Admitting: Orthotics

## 2019-10-23 ENCOUNTER — Ambulatory Visit: Payer: PPO | Admitting: Podiatry

## 2019-10-23 ENCOUNTER — Other Ambulatory Visit: Payer: Self-pay

## 2019-10-23 VITALS — Temp 97.1°F

## 2019-10-23 DIAGNOSIS — M2041 Other hammer toe(s) (acquired), right foot: Secondary | ICD-10-CM | POA: Diagnosis not present

## 2019-10-23 DIAGNOSIS — E1140 Type 2 diabetes mellitus with diabetic neuropathy, unspecified: Secondary | ICD-10-CM | POA: Diagnosis not present

## 2019-10-23 DIAGNOSIS — M609 Myositis, unspecified: Secondary | ICD-10-CM | POA: Insufficient documentation

## 2019-10-23 DIAGNOSIS — M79671 Pain in right foot: Secondary | ICD-10-CM | POA: Diagnosis not present

## 2019-10-23 DIAGNOSIS — M25569 Pain in unspecified knee: Secondary | ICD-10-CM | POA: Insufficient documentation

## 2019-10-23 DIAGNOSIS — M79672 Pain in left foot: Secondary | ICD-10-CM | POA: Diagnosis not present

## 2019-10-23 DIAGNOSIS — J309 Allergic rhinitis, unspecified: Secondary | ICD-10-CM | POA: Insufficient documentation

## 2019-10-23 DIAGNOSIS — R9431 Abnormal electrocardiogram [ECG] [EKG]: Secondary | ICD-10-CM | POA: Insufficient documentation

## 2019-10-23 DIAGNOSIS — K256 Chronic or unspecified gastric ulcer with both hemorrhage and perforation: Secondary | ICD-10-CM | POA: Insufficient documentation

## 2019-10-23 DIAGNOSIS — M722 Plantar fascial fibromatosis: Secondary | ICD-10-CM

## 2019-10-23 DIAGNOSIS — Z79899 Other long term (current) drug therapy: Secondary | ICD-10-CM | POA: Insufficient documentation

## 2019-10-23 DIAGNOSIS — E559 Vitamin D deficiency, unspecified: Secondary | ICD-10-CM | POA: Insufficient documentation

## 2019-10-23 DIAGNOSIS — M2042 Other hammer toe(s) (acquired), left foot: Secondary | ICD-10-CM | POA: Diagnosis not present

## 2019-10-23 DIAGNOSIS — R232 Flushing: Secondary | ICD-10-CM | POA: Insufficient documentation

## 2019-10-23 DIAGNOSIS — G609 Hereditary and idiopathic neuropathy, unspecified: Secondary | ICD-10-CM | POA: Insufficient documentation

## 2019-10-23 DIAGNOSIS — Z8249 Family history of ischemic heart disease and other diseases of the circulatory system: Secondary | ICD-10-CM | POA: Insufficient documentation

## 2019-10-23 DIAGNOSIS — E782 Mixed hyperlipidemia: Secondary | ICD-10-CM | POA: Insufficient documentation

## 2019-10-23 DIAGNOSIS — M19079 Primary osteoarthritis, unspecified ankle and foot: Secondary | ICD-10-CM

## 2019-10-23 DIAGNOSIS — R5383 Other fatigue: Secondary | ICD-10-CM | POA: Insufficient documentation

## 2019-10-23 DIAGNOSIS — G4700 Insomnia, unspecified: Secondary | ICD-10-CM | POA: Insufficient documentation

## 2019-10-23 DIAGNOSIS — R059 Cough, unspecified: Secondary | ICD-10-CM | POA: Insufficient documentation

## 2019-10-23 DIAGNOSIS — E1165 Type 2 diabetes mellitus with hyperglycemia: Secondary | ICD-10-CM | POA: Insufficient documentation

## 2019-10-23 DIAGNOSIS — K219 Gastro-esophageal reflux disease without esophagitis: Secondary | ICD-10-CM | POA: Insufficient documentation

## 2019-10-23 DIAGNOSIS — F419 Anxiety disorder, unspecified: Secondary | ICD-10-CM | POA: Insufficient documentation

## 2019-10-23 DIAGNOSIS — I8010 Phlebitis and thrombophlebitis of unspecified femoral vein: Secondary | ICD-10-CM | POA: Insufficient documentation

## 2019-10-23 DIAGNOSIS — J0410 Acute tracheitis without obstruction: Secondary | ICD-10-CM | POA: Insufficient documentation

## 2019-10-23 DIAGNOSIS — D509 Iron deficiency anemia, unspecified: Secondary | ICD-10-CM | POA: Insufficient documentation

## 2019-10-23 DIAGNOSIS — R000 Tachycardia, unspecified: Secondary | ICD-10-CM | POA: Insufficient documentation

## 2019-10-23 DIAGNOSIS — J209 Acute bronchitis, unspecified: Secondary | ICD-10-CM | POA: Insufficient documentation

## 2019-10-23 DIAGNOSIS — E1142 Type 2 diabetes mellitus with diabetic polyneuropathy: Secondary | ICD-10-CM | POA: Insufficient documentation

## 2019-10-23 DIAGNOSIS — R61 Generalized hyperhidrosis: Secondary | ICD-10-CM | POA: Insufficient documentation

## 2019-10-23 DIAGNOSIS — R6889 Other general symptoms and signs: Secondary | ICD-10-CM | POA: Insufficient documentation

## 2019-10-23 DIAGNOSIS — L509 Urticaria, unspecified: Secondary | ICD-10-CM | POA: Insufficient documentation

## 2019-10-23 DIAGNOSIS — K6289 Other specified diseases of anus and rectum: Secondary | ICD-10-CM | POA: Insufficient documentation

## 2019-10-23 DIAGNOSIS — Z418 Encounter for other procedures for purposes other than remedying health state: Secondary | ICD-10-CM | POA: Insufficient documentation

## 2019-10-23 DIAGNOSIS — E1149 Type 2 diabetes mellitus with other diabetic neurological complication: Secondary | ICD-10-CM

## 2019-10-23 NOTE — Progress Notes (Signed)

## 2019-10-23 NOTE — Progress Notes (Signed)
Subjective:   Patient ID: [name], female   DOB: 79 y.o.   MRN: [id number]   HPI Pain presents stating she is having digital deformities on both feet and she states that she gets arthritis and she needs new diabetic shoes.  Did have a tear of her Achilles tendon left which was casted and seems to be healing okay with patient physical therapy   ROS      Objective:  Physical Exam  Neurovascular status unchanged with patient found to have diminishment of sharp dull vibratory bilateral with patient on insulin.  Patient is found to have elevated rigid contracture digits 2 bilateral with redness across the head of the proximal phallus with no breakdown of tissue currently noted and does have what appears to be normal muscle strength of the Achilles currently     Assessment:  At risk diabetic with neuropathic diabetic disease with hammertoe deformity and irritation     Plan:  H&P reviewed both conditions educated her on condition.  At this point I have recommended diabetic shoes and patient will be seen by Liliane Channel for diabetic shoes and is encouraged to call us with questions concerns and continue to monitor her Achilles tendon

## 2019-10-24 DIAGNOSIS — M545 Low back pain: Secondary | ICD-10-CM | POA: Diagnosis not present

## 2019-10-24 DIAGNOSIS — M7662 Achilles tendinitis, left leg: Secondary | ICD-10-CM | POA: Diagnosis not present

## 2019-10-26 DIAGNOSIS — M545 Low back pain: Secondary | ICD-10-CM | POA: Diagnosis not present

## 2019-10-26 DIAGNOSIS — M7662 Achilles tendinitis, left leg: Secondary | ICD-10-CM | POA: Diagnosis not present

## 2019-10-27 DIAGNOSIS — M1990 Unspecified osteoarthritis, unspecified site: Secondary | ICD-10-CM | POA: Diagnosis not present

## 2019-10-27 DIAGNOSIS — I10 Essential (primary) hypertension: Secondary | ICD-10-CM | POA: Diagnosis not present

## 2019-10-27 DIAGNOSIS — M179 Osteoarthritis of knee, unspecified: Secondary | ICD-10-CM | POA: Diagnosis not present

## 2019-10-27 DIAGNOSIS — D509 Iron deficiency anemia, unspecified: Secondary | ICD-10-CM | POA: Diagnosis not present

## 2019-10-27 DIAGNOSIS — N183 Chronic kidney disease, stage 3 unspecified: Secondary | ICD-10-CM | POA: Diagnosis not present

## 2019-10-27 DIAGNOSIS — E782 Mixed hyperlipidemia: Secondary | ICD-10-CM | POA: Diagnosis not present

## 2019-10-27 DIAGNOSIS — E1140 Type 2 diabetes mellitus with diabetic neuropathy, unspecified: Secondary | ICD-10-CM | POA: Diagnosis not present

## 2019-10-27 DIAGNOSIS — G4700 Insomnia, unspecified: Secondary | ICD-10-CM | POA: Diagnosis not present

## 2019-10-27 DIAGNOSIS — J449 Chronic obstructive pulmonary disease, unspecified: Secondary | ICD-10-CM | POA: Diagnosis not present

## 2019-10-30 ENCOUNTER — Ambulatory Visit
Admission: RE | Admit: 2019-10-30 | Discharge: 2019-10-30 | Disposition: A | Discharge status: Home / Self Care | Payer: HMO | Source: Ambulatory Visit | Attending: Internal Medicine | Admitting: Internal Medicine

## 2019-10-30 ENCOUNTER — Other Ambulatory Visit: Payer: Self-pay

## 2019-10-30 DIAGNOSIS — R928 Other abnormal and inconclusive findings on diagnostic imaging of breast: Secondary | ICD-10-CM | POA: Diagnosis not present

## 2019-10-30 DIAGNOSIS — N6489 Other specified disorders of breast: Secondary | ICD-10-CM | POA: Diagnosis not present

## 2019-10-31 DIAGNOSIS — M545 Low back pain: Secondary | ICD-10-CM | POA: Diagnosis not present

## 2019-10-31 DIAGNOSIS — M7662 Achilles tendinitis, left leg: Secondary | ICD-10-CM | POA: Diagnosis not present

## 2019-11-02 DIAGNOSIS — M5441 Lumbago with sciatica, right side: Secondary | ICD-10-CM | POA: Diagnosis not present

## 2019-11-02 DIAGNOSIS — M9903 Segmental and somatic dysfunction of lumbar region: Secondary | ICD-10-CM | POA: Diagnosis not present

## 2019-11-02 DIAGNOSIS — M62830 Muscle spasm of back: Secondary | ICD-10-CM | POA: Diagnosis not present

## 2019-11-02 DIAGNOSIS — M5442 Lumbago with sciatica, left side: Secondary | ICD-10-CM | POA: Diagnosis not present

## 2019-11-06 DIAGNOSIS — M9903 Segmental and somatic dysfunction of lumbar region: Secondary | ICD-10-CM | POA: Diagnosis not present

## 2019-11-06 DIAGNOSIS — M62830 Muscle spasm of back: Secondary | ICD-10-CM | POA: Diagnosis not present

## 2019-11-06 DIAGNOSIS — M5442 Lumbago with sciatica, left side: Secondary | ICD-10-CM | POA: Diagnosis not present

## 2019-11-06 DIAGNOSIS — M5441 Lumbago with sciatica, right side: Secondary | ICD-10-CM | POA: Diagnosis not present

## 2019-11-07 DIAGNOSIS — M545 Low back pain: Secondary | ICD-10-CM | POA: Diagnosis not present

## 2019-11-07 DIAGNOSIS — M7662 Achilles tendinitis, left leg: Secondary | ICD-10-CM | POA: Diagnosis not present

## 2019-11-08 DIAGNOSIS — M5441 Lumbago with sciatica, right side: Secondary | ICD-10-CM | POA: Diagnosis not present

## 2019-11-08 DIAGNOSIS — M5442 Lumbago with sciatica, left side: Secondary | ICD-10-CM | POA: Diagnosis not present

## 2019-11-08 DIAGNOSIS — M62830 Muscle spasm of back: Secondary | ICD-10-CM | POA: Diagnosis not present

## 2019-11-08 DIAGNOSIS — M9903 Segmental and somatic dysfunction of lumbar region: Secondary | ICD-10-CM | POA: Diagnosis not present

## 2019-11-09 DIAGNOSIS — M7662 Achilles tendinitis, left leg: Secondary | ICD-10-CM | POA: Diagnosis not present

## 2019-11-09 DIAGNOSIS — M545 Low back pain: Secondary | ICD-10-CM | POA: Diagnosis not present

## 2019-11-16 DIAGNOSIS — M62830 Muscle spasm of back: Secondary | ICD-10-CM | POA: Diagnosis not present

## 2019-11-16 DIAGNOSIS — M5442 Lumbago with sciatica, left side: Secondary | ICD-10-CM | POA: Diagnosis not present

## 2019-11-16 DIAGNOSIS — M9903 Segmental and somatic dysfunction of lumbar region: Secondary | ICD-10-CM | POA: Diagnosis not present

## 2019-11-16 DIAGNOSIS — M5441 Lumbago with sciatica, right side: Secondary | ICD-10-CM | POA: Diagnosis not present

## 2019-11-21 DIAGNOSIS — M7662 Achilles tendinitis, left leg: Secondary | ICD-10-CM | POA: Diagnosis not present

## 2019-11-21 DIAGNOSIS — M545 Low back pain: Secondary | ICD-10-CM | POA: Diagnosis not present

## 2019-11-28 DIAGNOSIS — M5416 Radiculopathy, lumbar region: Secondary | ICD-10-CM | POA: Diagnosis not present

## 2019-11-28 DIAGNOSIS — M25511 Pain in right shoulder: Secondary | ICD-10-CM | POA: Diagnosis not present

## 2019-12-05 DIAGNOSIS — M5442 Lumbago with sciatica, left side: Secondary | ICD-10-CM | POA: Diagnosis not present

## 2019-12-05 DIAGNOSIS — H5203 Hypermetropia, bilateral: Secondary | ICD-10-CM | POA: Diagnosis not present

## 2019-12-05 DIAGNOSIS — M62830 Muscle spasm of back: Secondary | ICD-10-CM | POA: Diagnosis not present

## 2019-12-05 DIAGNOSIS — H25813 Combined forms of age-related cataract, bilateral: Secondary | ICD-10-CM | POA: Diagnosis not present

## 2019-12-05 DIAGNOSIS — M9903 Segmental and somatic dysfunction of lumbar region: Secondary | ICD-10-CM | POA: Diagnosis not present

## 2019-12-05 DIAGNOSIS — H524 Presbyopia: Secondary | ICD-10-CM | POA: Diagnosis not present

## 2019-12-05 DIAGNOSIS — H52223 Regular astigmatism, bilateral: Secondary | ICD-10-CM | POA: Diagnosis not present

## 2019-12-05 DIAGNOSIS — M5441 Lumbago with sciatica, right side: Secondary | ICD-10-CM | POA: Diagnosis not present

## 2019-12-05 DIAGNOSIS — E119 Type 2 diabetes mellitus without complications: Secondary | ICD-10-CM | POA: Diagnosis not present

## 2019-12-07 ENCOUNTER — Other Ambulatory Visit: Payer: Self-pay | Admitting: Neurological Surgery

## 2019-12-07 DIAGNOSIS — Z6832 Body mass index (BMI) 32.0-32.9, adult: Secondary | ICD-10-CM | POA: Diagnosis not present

## 2019-12-07 DIAGNOSIS — M545 Low back pain, unspecified: Secondary | ICD-10-CM

## 2019-12-07 DIAGNOSIS — I10 Essential (primary) hypertension: Secondary | ICD-10-CM | POA: Diagnosis not present

## 2019-12-15 ENCOUNTER — Ambulatory Visit
Admission: RE | Admit: 2019-12-15 | Discharge: 2019-12-15 | Disposition: A | Discharge status: Home / Self Care | Payer: HMO | Source: Ambulatory Visit | Attending: Neurological Surgery | Admitting: Neurological Surgery

## 2019-12-15 DIAGNOSIS — M545 Low back pain, unspecified: Secondary | ICD-10-CM

## 2019-12-18 ENCOUNTER — Ambulatory Visit (INDEPENDENT_AMBULATORY_CARE_PROVIDER_SITE_OTHER): Payer: HMO | Admitting: Orthotics

## 2019-12-18 ENCOUNTER — Other Ambulatory Visit: Payer: Self-pay

## 2019-12-18 DIAGNOSIS — E1140 Type 2 diabetes mellitus with diabetic neuropathy, unspecified: Secondary | ICD-10-CM | POA: Diagnosis not present

## 2019-12-18 DIAGNOSIS — M2042 Other hammer toe(s) (acquired), left foot: Secondary | ICD-10-CM

## 2019-12-18 DIAGNOSIS — M2021 Hallux rigidus, right foot: Secondary | ICD-10-CM | POA: Diagnosis not present

## 2019-12-18 DIAGNOSIS — M2041 Other hammer toe(s) (acquired), right foot: Secondary | ICD-10-CM | POA: Diagnosis not present

## 2019-12-18 DIAGNOSIS — M2022 Hallux rigidus, left foot: Secondary | ICD-10-CM

## 2019-12-18 DIAGNOSIS — E1149 Type 2 diabetes mellitus with other diabetic neurological complication: Secondary | ICD-10-CM | POA: Diagnosis not present

## 2019-12-18 DIAGNOSIS — M779 Enthesopathy, unspecified: Secondary | ICD-10-CM

## 2019-12-18 NOTE — Progress Notes (Signed)

## 2019-12-19 DIAGNOSIS — M62830 Muscle spasm of back: Secondary | ICD-10-CM | POA: Diagnosis not present

## 2019-12-19 DIAGNOSIS — M9903 Segmental and somatic dysfunction of lumbar region: Secondary | ICD-10-CM | POA: Diagnosis not present

## 2019-12-19 DIAGNOSIS — M5442 Lumbago with sciatica, left side: Secondary | ICD-10-CM | POA: Diagnosis not present

## 2019-12-19 DIAGNOSIS — M5441 Lumbago with sciatica, right side: Secondary | ICD-10-CM | POA: Diagnosis not present

## 2019-12-21 DIAGNOSIS — M5416 Radiculopathy, lumbar region: Secondary | ICD-10-CM | POA: Diagnosis not present

## 2019-12-27 DIAGNOSIS — B351 Tinea unguium: Secondary | ICD-10-CM | POA: Diagnosis not present

## 2019-12-27 DIAGNOSIS — E1351 Other specified diabetes mellitus with diabetic peripheral angiopathy without gangrene: Secondary | ICD-10-CM | POA: Diagnosis not present

## 2019-12-27 DIAGNOSIS — M2021 Hallux rigidus, right foot: Secondary | ICD-10-CM | POA: Diagnosis not present

## 2019-12-27 DIAGNOSIS — M792 Neuralgia and neuritis, unspecified: Secondary | ICD-10-CM | POA: Diagnosis not present

## 2019-12-27 DIAGNOSIS — M205X2 Other deformities of toe(s) (acquired), left foot: Secondary | ICD-10-CM | POA: Diagnosis not present

## 2019-12-28 DIAGNOSIS — M545 Low back pain: Secondary | ICD-10-CM | POA: Diagnosis not present

## 2020-01-09 DIAGNOSIS — J449 Chronic obstructive pulmonary disease, unspecified: Secondary | ICD-10-CM | POA: Diagnosis not present

## 2020-01-09 DIAGNOSIS — I10 Essential (primary) hypertension: Secondary | ICD-10-CM | POA: Diagnosis not present

## 2020-01-09 DIAGNOSIS — N183 Chronic kidney disease, stage 3 unspecified: Secondary | ICD-10-CM | POA: Diagnosis not present

## 2020-01-09 DIAGNOSIS — E1140 Type 2 diabetes mellitus with diabetic neuropathy, unspecified: Secondary | ICD-10-CM | POA: Diagnosis not present

## 2020-01-09 DIAGNOSIS — E1165 Type 2 diabetes mellitus with hyperglycemia: Secondary | ICD-10-CM | POA: Diagnosis not present

## 2020-01-09 DIAGNOSIS — G4700 Insomnia, unspecified: Secondary | ICD-10-CM | POA: Diagnosis not present

## 2020-01-09 DIAGNOSIS — D509 Iron deficiency anemia, unspecified: Secondary | ICD-10-CM | POA: Diagnosis not present

## 2020-01-09 DIAGNOSIS — M1990 Unspecified osteoarthritis, unspecified site: Secondary | ICD-10-CM | POA: Diagnosis not present

## 2020-01-09 DIAGNOSIS — E782 Mixed hyperlipidemia: Secondary | ICD-10-CM | POA: Diagnosis not present

## 2020-01-09 DIAGNOSIS — M179 Osteoarthritis of knee, unspecified: Secondary | ICD-10-CM | POA: Diagnosis not present

## 2020-01-18 DIAGNOSIS — Z23 Encounter for immunization: Secondary | ICD-10-CM | POA: Diagnosis not present

## 2020-01-18 DIAGNOSIS — N183 Chronic kidney disease, stage 3 unspecified: Secondary | ICD-10-CM | POA: Diagnosis not present

## 2020-01-18 DIAGNOSIS — E782 Mixed hyperlipidemia: Secondary | ICD-10-CM | POA: Diagnosis not present

## 2020-01-18 DIAGNOSIS — I10 Essential (primary) hypertension: Secondary | ICD-10-CM | POA: Diagnosis not present

## 2020-01-18 DIAGNOSIS — M549 Dorsalgia, unspecified: Secondary | ICD-10-CM | POA: Diagnosis not present

## 2020-01-18 DIAGNOSIS — E1140 Type 2 diabetes mellitus with diabetic neuropathy, unspecified: Secondary | ICD-10-CM | POA: Diagnosis not present

## 2020-01-18 DIAGNOSIS — J449 Chronic obstructive pulmonary disease, unspecified: Secondary | ICD-10-CM | POA: Diagnosis not present

## 2020-01-18 DIAGNOSIS — D509 Iron deficiency anemia, unspecified: Secondary | ICD-10-CM | POA: Diagnosis not present

## 2020-01-23 DIAGNOSIS — G4700 Insomnia, unspecified: Secondary | ICD-10-CM | POA: Diagnosis not present

## 2020-01-23 DIAGNOSIS — N183 Chronic kidney disease, stage 3 unspecified: Secondary | ICD-10-CM | POA: Diagnosis not present

## 2020-01-23 DIAGNOSIS — E1140 Type 2 diabetes mellitus with diabetic neuropathy, unspecified: Secondary | ICD-10-CM | POA: Diagnosis not present

## 2020-01-23 DIAGNOSIS — D509 Iron deficiency anemia, unspecified: Secondary | ICD-10-CM | POA: Diagnosis not present

## 2020-01-23 DIAGNOSIS — E1165 Type 2 diabetes mellitus with hyperglycemia: Secondary | ICD-10-CM | POA: Diagnosis not present

## 2020-01-23 DIAGNOSIS — M1990 Unspecified osteoarthritis, unspecified site: Secondary | ICD-10-CM | POA: Diagnosis not present

## 2020-01-23 DIAGNOSIS — J449 Chronic obstructive pulmonary disease, unspecified: Secondary | ICD-10-CM | POA: Diagnosis not present

## 2020-01-23 DIAGNOSIS — M179 Osteoarthritis of knee, unspecified: Secondary | ICD-10-CM | POA: Diagnosis not present

## 2020-01-23 DIAGNOSIS — E782 Mixed hyperlipidemia: Secondary | ICD-10-CM | POA: Diagnosis not present

## 2020-01-23 DIAGNOSIS — I10 Essential (primary) hypertension: Secondary | ICD-10-CM | POA: Diagnosis not present

## 2020-02-01 DIAGNOSIS — M62830 Muscle spasm of back: Secondary | ICD-10-CM | POA: Diagnosis not present

## 2020-02-01 DIAGNOSIS — M5442 Lumbago with sciatica, left side: Secondary | ICD-10-CM | POA: Diagnosis not present

## 2020-02-01 DIAGNOSIS — M5441 Lumbago with sciatica, right side: Secondary | ICD-10-CM | POA: Diagnosis not present

## 2020-02-01 DIAGNOSIS — M9903 Segmental and somatic dysfunction of lumbar region: Secondary | ICD-10-CM | POA: Diagnosis not present

## 2020-02-19 DIAGNOSIS — M62830 Muscle spasm of back: Secondary | ICD-10-CM | POA: Diagnosis not present

## 2020-02-19 DIAGNOSIS — M9903 Segmental and somatic dysfunction of lumbar region: Secondary | ICD-10-CM | POA: Diagnosis not present

## 2020-02-19 DIAGNOSIS — M5441 Lumbago with sciatica, right side: Secondary | ICD-10-CM | POA: Diagnosis not present

## 2020-02-19 DIAGNOSIS — M5442 Lumbago with sciatica, left side: Secondary | ICD-10-CM | POA: Diagnosis not present

## 2020-02-29 DIAGNOSIS — L814 Other melanin hyperpigmentation: Secondary | ICD-10-CM | POA: Diagnosis not present

## 2020-02-29 DIAGNOSIS — L821 Other seborrheic keratosis: Secondary | ICD-10-CM | POA: Diagnosis not present

## 2020-02-29 DIAGNOSIS — D225 Melanocytic nevi of trunk: Secondary | ICD-10-CM | POA: Diagnosis not present

## 2020-02-29 DIAGNOSIS — Z85828 Personal history of other malignant neoplasm of skin: Secondary | ICD-10-CM | POA: Diagnosis not present

## 2020-03-05 DIAGNOSIS — M5441 Lumbago with sciatica, right side: Secondary | ICD-10-CM | POA: Diagnosis not present

## 2020-03-05 DIAGNOSIS — M62830 Muscle spasm of back: Secondary | ICD-10-CM | POA: Diagnosis not present

## 2020-03-05 DIAGNOSIS — M5442 Lumbago with sciatica, left side: Secondary | ICD-10-CM | POA: Diagnosis not present

## 2020-03-05 DIAGNOSIS — M9903 Segmental and somatic dysfunction of lumbar region: Secondary | ICD-10-CM | POA: Diagnosis not present

## 2020-03-18 ENCOUNTER — Other Ambulatory Visit: Payer: Self-pay | Admitting: *Deleted

## 2020-03-18 NOTE — Patient Outreach (Signed)
  [facility]) Care Management Chronic Special Needs Program    03/18/2020  Name: [name], DOB: 1941/04/17  MRN: [id number]   Ms. [name] is enrolled in a chronic special needs plan for Diabetes.  [facility] care management will continue to provide services for this member through 05/10/20.  [facility] care management team will assume care 05/11/20.   [name] [facility], BSN [place], [place]

## 2020-03-26 DIAGNOSIS — G4700 Insomnia, unspecified: Secondary | ICD-10-CM | POA: Diagnosis not present

## 2020-03-26 DIAGNOSIS — E1140 Type 2 diabetes mellitus with diabetic neuropathy, unspecified: Secondary | ICD-10-CM | POA: Diagnosis not present

## 2020-03-26 DIAGNOSIS — K219 Gastro-esophageal reflux disease without esophagitis: Secondary | ICD-10-CM | POA: Diagnosis not present

## 2020-03-26 DIAGNOSIS — I10 Essential (primary) hypertension: Secondary | ICD-10-CM | POA: Diagnosis not present

## 2020-03-26 DIAGNOSIS — M62830 Muscle spasm of back: Secondary | ICD-10-CM | POA: Diagnosis not present

## 2020-03-26 DIAGNOSIS — E782 Mixed hyperlipidemia: Secondary | ICD-10-CM | POA: Diagnosis not present

## 2020-03-26 DIAGNOSIS — D509 Iron deficiency anemia, unspecified: Secondary | ICD-10-CM | POA: Diagnosis not present

## 2020-03-26 DIAGNOSIS — M5442 Lumbago with sciatica, left side: Secondary | ICD-10-CM | POA: Diagnosis not present

## 2020-03-26 DIAGNOSIS — M9903 Segmental and somatic dysfunction of lumbar region: Secondary | ICD-10-CM | POA: Diagnosis not present

## 2020-03-26 DIAGNOSIS — E1165 Type 2 diabetes mellitus with hyperglycemia: Secondary | ICD-10-CM | POA: Diagnosis not present

## 2020-03-26 DIAGNOSIS — M5441 Lumbago with sciatica, right side: Secondary | ICD-10-CM | POA: Diagnosis not present

## 2020-03-26 DIAGNOSIS — N183 Chronic kidney disease, stage 3 unspecified: Secondary | ICD-10-CM | POA: Diagnosis not present

## 2020-03-26 DIAGNOSIS — M1990 Unspecified osteoarthritis, unspecified site: Secondary | ICD-10-CM | POA: Diagnosis not present

## 2020-03-26 DIAGNOSIS — J449 Chronic obstructive pulmonary disease, unspecified: Secondary | ICD-10-CM | POA: Diagnosis not present

## 2020-03-26 DIAGNOSIS — M179 Osteoarthritis of knee, unspecified: Secondary | ICD-10-CM | POA: Diagnosis not present

## 2020-04-11 DIAGNOSIS — M25511 Pain in right shoulder: Secondary | ICD-10-CM | POA: Diagnosis not present

## 2020-04-11 DIAGNOSIS — M5416 Radiculopathy, lumbar region: Secondary | ICD-10-CM | POA: Diagnosis not present

## 2020-04-11 DIAGNOSIS — M25512 Pain in left shoulder: Secondary | ICD-10-CM | POA: Diagnosis not present

## 2020-04-22 DIAGNOSIS — D509 Iron deficiency anemia, unspecified: Secondary | ICD-10-CM | POA: Diagnosis not present

## 2020-04-22 DIAGNOSIS — J449 Chronic obstructive pulmonary disease, unspecified: Secondary | ICD-10-CM | POA: Diagnosis not present

## 2020-04-22 DIAGNOSIS — N183 Chronic kidney disease, stage 3 unspecified: Secondary | ICD-10-CM | POA: Diagnosis not present

## 2020-04-22 DIAGNOSIS — M549 Dorsalgia, unspecified: Secondary | ICD-10-CM | POA: Diagnosis not present

## 2020-04-22 DIAGNOSIS — R5382 Chronic fatigue, unspecified: Secondary | ICD-10-CM | POA: Diagnosis not present

## 2020-04-22 DIAGNOSIS — E782 Mixed hyperlipidemia: Secondary | ICD-10-CM | POA: Diagnosis not present

## 2020-04-22 DIAGNOSIS — Z23 Encounter for immunization: Secondary | ICD-10-CM | POA: Diagnosis not present

## 2020-04-22 DIAGNOSIS — E1140 Type 2 diabetes mellitus with diabetic neuropathy, unspecified: Secondary | ICD-10-CM | POA: Diagnosis not present

## 2020-04-22 DIAGNOSIS — Z7984 Long term (current) use of oral hypoglycemic drugs: Secondary | ICD-10-CM | POA: Diagnosis not present

## 2020-04-22 DIAGNOSIS — I10 Essential (primary) hypertension: Secondary | ICD-10-CM | POA: Diagnosis not present

## 2020-04-22 DIAGNOSIS — Z79899 Other long term (current) drug therapy: Secondary | ICD-10-CM | POA: Diagnosis not present

## 2020-04-26 ENCOUNTER — Encounter: Payer: Self-pay | Admitting: Cardiology

## 2020-04-26 ENCOUNTER — Ambulatory Visit (INDEPENDENT_AMBULATORY_CARE_PROVIDER_SITE_OTHER): Payer: HMO | Admitting: Cardiology

## 2020-04-26 ENCOUNTER — Other Ambulatory Visit: Payer: Self-pay

## 2020-04-26 VITALS — BP 120/80 | HR 58 | Ht 65.0 in | Wt 201.0 lb

## 2020-04-26 DIAGNOSIS — I700 Atherosclerosis of aorta: Secondary | ICD-10-CM | POA: Diagnosis not present

## 2020-04-26 DIAGNOSIS — R0602 Shortness of breath: Secondary | ICD-10-CM

## 2020-04-26 DIAGNOSIS — R002 Palpitations: Secondary | ICD-10-CM

## 2020-04-26 DIAGNOSIS — I10 Essential (primary) hypertension: Secondary | ICD-10-CM | POA: Diagnosis not present

## 2020-04-26 NOTE — Progress Notes (Signed)
Cardiology Office Note:    Date:  04/26/2020   ID:  [name], DOB 1940-05-16, MRN [id number]  PCP:  [name], MD  [facility] Cardiologist:  [name], MD  [facility] Electrophysiologist:  None   Referring MD: [name], MD     History of Present Illness:    [name] is a 79 y.o. female here for follow-up of aortic atherosclerosis.  He has had dyspnea on exertion as well as evaluation of high heart rate at bedtime.  Does water aerobics for an hour and felt fine.  Frustrating for her her amount of dyspnea on exertion.  Diabetes history of DVT in 2003 Father had heart attack in his 34s.  In 2012 2014 2017-stress test low risk no ischemia CT scan negative for PE Echo 2014 reassuring with normal EF Cardiopulmonary stress test 2017 reassuring.  She believes that this all started back after her knee surgery over 15 years ago. Fatigue/malaise  Past Medical History:  Diagnosis Date  . Anemia   . Arthritis   . Battery end of life of spinal cord stimulator 2016   battery replaced.   Marland Kitchen COPD (chronic obstructive pulmonary disease) ([place])    'Mild' per pt report  . Diabetes mellitus   . DVT (deep venous thrombosis) ([place])   . Dyspnea    on exertion  . GERD (gastroesophageal reflux disease)   . History of kidney stones   . History of stomach ulcers   . Hypertension    dr Marlou Porch  . Neuropathy   . Rapid heartbeat   . Sleep apnea    borderline study; could use a CPAP if desired.     Past Surgical History:  Procedure Laterality Date  . ABDOMINAL HYSTERECTOMY  1979 & 1981   ovaries and uterus removed at different times.  . APPENDECTOMY    . ARTHOSCOPIC ROTAOR CUFF REPAIR Left 2010 or 2011  . BACK SURGERY  x 2 1999 and 2008   lower back L 4 to L 5 L5 to S 1  . BREAST BIOPSY Bilateral 05/18/1997  . CARPAL TUNNEL RELEASE Right 2014  . CARPAL TUNNEL RELEASE Bilateral   . COLONOSCOPY WITH PROPOFOL N/A 10/25/2012   Procedure: COLONOSCOPY WITH  PROPOFOL;  Surgeon: [name], MD;  Location: [facility];  Service: Endoscopy;  Laterality: N/A;  . EYE SURGERY     eyelid surgery  . JOINT REPLACEMENT Right 2012   knee  . KNEE ARTHROSCOPY Right 2004  . LUMBAR LAMINECTOMY/DECOMPRESSION MICRODISCECTOMY Bilateral 03/03/2018   Procedure: Laminectomy and Foraminotomy - Lumbar one-Lumbar two - bilateral Posterior lateral fusion;  Surgeon: [name], MD;  Location: [place];  Service: Neurosurgery;  Laterality: Bilateral;  . ovarian wedge resection  1966  . SPINAL CORD STIMULATOR INSERTION N/A 11/03/2012   Procedure: LUMBAR SPINAL CORD STIMULATOR INSERTION;  Surgeon: [name], MD;  Location: [place];  Service: Orthopedics;  Laterality: N/A;  . SPINAL FUSION  10/2016  . TONSILLECTOMY  1960  . TOTAL KNEE ARTHROPLASTY Left 06/13/2018   Procedure: TOTAL KNEE ARTHROPLASTY;  Surgeon: Gaynelle Arabian, MD;  Location: [facility];  Service: Orthopedics;  Laterality: Left;  Adductor Block    Current Medications: Current Meds  Medication Sig  . albuterol (PROVENTIL HFA;VENTOLIN HFA) 108 (90 Base) MCG/ACT inhaler Inhale 1 puff into the lungs every 6 (six) hours as needed for wheezing or shortness of breath.  Marland Kitchen aspirin 81 MG chewable tablet Chew 81 mg by mouth daily.  . Biotin  5000 MCG TABS Take 5,000 mcg by mouth daily.  . calcium carbonate (OSCAL) 1500 (600 Ca) MG TABS tablet Take 1 tablet by mouth daily.  Marland Kitchen estradiol (ESTRACE) 1 MG tablet Take 1 mg by mouth every evening.   . fexofenadine (ALLEGRA) 180 MG tablet Take 180 mg by mouth daily as needed for allergies or rhinitis.  . fluticasone furoate-vilanterol (BREO ELLIPTA) 100-25 MCG/INH AEPB Inhale 2 puffs into the lungs daily.   . furosemide (LASIX) 20 MG tablet Take 20 mg by mouth daily as needed for fluid.   Marland Kitchen glipiZIDE (GLUCOTROL) 10 MG tablet Take 10 mg by mouth 2 (two) times daily.  Marland Kitchen HYDROcodone-acetaminophen (NORCO) 7.5-325 MG tablet Take 1 tablet by mouth every 8 (eight) hours as needed  for moderate pain.  . Insulin Glargine 300 UNIT/ML SOPN Inject 60 Units into the skin daily.   Marland Kitchen losartan (COZAAR) 100 MG tablet Take 100 mg by mouth every evening.   . meloxicam (MOBIC) 7.5 MG tablet Take 7.5 mg by mouth daily as needed for pain.  . metFORMIN (GLUCOPHAGE) 1000 MG tablet Take 1,000 mg by mouth 2 (two) times daily.   . metoprolol succinate (TOPROL-XL) 50 MG 24 hr tablet Take 1 tablet (50 mg total) by mouth 2 (two) times daily.  . Omega-3 Fatty Acids (FISH OIL) 1200 MG CAPS Take 1,200 mg by mouth 2 (two) times daily.  Marland Kitchen omeprazole (PRILOSEC) 40 MG capsule Take 40 mg by mouth daily.  . potassium chloride (K-DUR,KLOR-CON) 10 MEQ tablet Take 10 mEq by mouth daily.   . rosuvastatin (CRESTOR) 10 MG tablet Take 10 mg by mouth every evening.      Allergies:   Hydrochlorothiazide; Ace inhibitors; Ezetimibe-simvastatin; Indomethacin; Lisinopril; Lyrica [pregabalin]; Prednisone; Simvastatin; Vioxx [rofecoxib]; Troglitazone; and Antihistamines, chlorpheniramine-type   Social History   Socioeconomic History  . Marital status: Married    Spouse name: Not on file  . Number of children: Not on file  . Years of education: Not on file  . Highest education level: Not on file  Occupational History  . Not on file  Tobacco Use  . Smoking status: Never Smoker  . Smokeless tobacco: Never Used  Vaping Use  . Vaping Use: Never used  Substance and Sexual Activity  . Alcohol use: No  . Drug use: No  . Sexual activity: Not on file  Other Topics Concern  . Not on file  Social History Narrative  . Not on file   Social Determinants of Health   Financial Resource Strain: Not on file  Food Insecurity: Not on file  Transportation Needs: No Transportation Needs  . Lack of Transportation (Medical): No  . Lack of Transportation (Non-Medical): No  Physical Activity: Not on file  Stress: Not on file  Social Connections: Not on file     Family History: The patient's family history includes  Breast cancer (age of onset: 56) in her daughter; Diabetes in her mother; Heart disease in her father.  ROS:   Please see the history of present illness.     All other systems reviewed and are negative.  EKGs/Labs/Other Studies Reviewed:    EKG:  EKG is  ordered today.  The ekg ordered today demonstrates sinus bradycardia 58 no other abnormalities  Recent Labs: No results found for requested labs within last 8760 hours.  Recent Lipid Panel No results found for: CHOL, TRIG, HDL, CHOLHDL, VLDL, LDLCALC, LDLDIRECT   Risk Assessment/Calculations:       Physical Exam:    VS:  BP 120/80 (BP Location: Left Arm, Patient Position: Sitting, Cuff Size: Normal)   Pulse (!) 58   Ht 5\' 5"  (1.651 m)   Wt 201 lb (91.2 kg)   LMP  (LMP Unknown)   SpO2 97%   BMI 33.45 kg/m     Wt Readings from Last 3 Encounters:  04/26/20 201 lb (91.2 kg)  04/19/19 210 lb (95.3 kg)  06/13/18 194 lb 3.6 oz (88.1 kg)     GEN:  Well nourished, well developed in no acute distress HEENT: Normal NECK: No JVD; No carotid bruits LYMPHATICS: No lymphadenopathy CARDIAC: RRR, no murmurs, rubs, gallops RESPIRATORY:  Clear to auscultation without rales, wheezing or rhonchi  ABDOMEN: Soft, non-tender, non-distended MUSCULOSKELETAL:  No edema; No deformity  SKIN: Warm and dry NEUROLOGIC:  Alert and oriented x 3 PSYCHIATRIC:  Normal affect   ASSESSMENT:    1. Essential hypertension   2. Aortic atherosclerosis (HCC)   3. Palpitations   4. Shortness of breath    PLAN:    In order of problems listed above:  Aortic atherosclerosis -Seen on prior myelogram personally reviewed.  Continue with blood pressure control statin no other changes.  Palpitations -Occasional elevated heart rate seems to be doing well with Toprol.  No changes.  Dyspnea -Extensive work-up overall reassuring.  See above for details -Continue with exercise. -Certainly frustrating for her.  She had an Achilles tendon tear and boot since  her last visit.  Now walks with a cane.  Even more deconditioning is noted.  Motivation.  Hyperlipidemia -LDL cholesterol 127.  On rosuvastatin 10 mg.        Medication Adjustments/Labs and Tests Ordered: Current medicines are reviewed at length with the patient today.  Concerns regarding medicines are outlined above.  Orders Placed This Encounter  Procedures  . EKG 12-Lead   No orders of the defined types were placed in this encounter.   Patient Instructions  Medication Instructions:  The current medical regimen is effective;  continue present plan and medications.  *If you need a refill on your cardiac medications before your next appointment, please call your pharmacy*  Follow-Up: At [facility], you and your health needs are our priority.  As part of our continuing mission to provide you with exceptional heart care, we have created designated Provider Care Teams.  These Care Teams include your primary Cardiologist (physician) and Advanced Practice Providers (APPs -  Physician Assistants and Nurse Practitioners) who all work together to provide you with the care you need, when you need it.  We recommend signing up for the patient portal called "MyChart".  Sign up information is provided on this After Visit Summary.  MyChart is used to connect with patients for Virtual Visits (Telemedicine).  Patients are able to view lab/test results, encounter notes, upcoming appointments, etc.  Non-urgent messages can be sent to your provider as well.   To learn more about what you can do with MyChart, go to [email or web address].    Your next appointment:   12 month(s)  The format for your next appointment:   In Person  Provider:   [name], MD   Thank you for choosing [facility]!!        Signed, [name], MD  04/26/2020 2:26 PM    [place]

## 2020-04-26 NOTE — Patient Instructions (Signed)
Medication Instructions:  The current medical regimen is effective;  continue present plan and medications.  *If you need a refill on your cardiac medications before your next appointment, please call your pharmacy*  Follow-Up: At [facility], you and your health needs are our priority.  As part of our continuing mission to provide you with exceptional heart care, we have created designated Provider Care Teams.  These Care Teams include your primary Cardiologist (physician) and Advanced Practice Providers (APPs -  Physician Assistants and Nurse Practitioners) who all work together to provide you with the care you need, when you need it.  We recommend signing up for the patient portal called "MyChart".  Sign up information is provided on this After Visit Summary.  MyChart is used to connect with patients for Virtual Visits (Telemedicine).  Patients are able to view lab/test results, encounter notes, upcoming appointments, etc.  Non-urgent messages can be sent to your provider as well.   To learn more about what you can do with MyChart, go to [email or web address].    Your next appointment:   12 month(s)  The format for your next appointment:   In Person  Provider:   Mark Skains, MD   Thank you for choosing [facility]!!      

## 2020-04-30 DIAGNOSIS — M5416 Radiculopathy, lumbar region: Secondary | ICD-10-CM | POA: Diagnosis not present

## 2020-05-08 DIAGNOSIS — E1140 Type 2 diabetes mellitus with diabetic neuropathy, unspecified: Secondary | ICD-10-CM | POA: Diagnosis not present

## 2020-05-08 DIAGNOSIS — E782 Mixed hyperlipidemia: Secondary | ICD-10-CM | POA: Diagnosis not present

## 2020-05-08 DIAGNOSIS — M5441 Lumbago with sciatica, right side: Secondary | ICD-10-CM | POA: Diagnosis not present

## 2020-05-08 DIAGNOSIS — M9903 Segmental and somatic dysfunction of lumbar region: Secondary | ICD-10-CM | POA: Diagnosis not present

## 2020-05-08 DIAGNOSIS — M179 Osteoarthritis of knee, unspecified: Secondary | ICD-10-CM | POA: Diagnosis not present

## 2020-05-08 DIAGNOSIS — M1990 Unspecified osteoarthritis, unspecified site: Secondary | ICD-10-CM | POA: Diagnosis not present

## 2020-05-08 DIAGNOSIS — E1165 Type 2 diabetes mellitus with hyperglycemia: Secondary | ICD-10-CM | POA: Diagnosis not present

## 2020-05-08 DIAGNOSIS — J449 Chronic obstructive pulmonary disease, unspecified: Secondary | ICD-10-CM | POA: Diagnosis not present

## 2020-05-08 DIAGNOSIS — M5442 Lumbago with sciatica, left side: Secondary | ICD-10-CM | POA: Diagnosis not present

## 2020-05-08 DIAGNOSIS — G4700 Insomnia, unspecified: Secondary | ICD-10-CM | POA: Diagnosis not present

## 2020-05-08 DIAGNOSIS — D509 Iron deficiency anemia, unspecified: Secondary | ICD-10-CM | POA: Diagnosis not present

## 2020-05-08 DIAGNOSIS — I10 Essential (primary) hypertension: Secondary | ICD-10-CM | POA: Diagnosis not present

## 2020-05-08 DIAGNOSIS — M62830 Muscle spasm of back: Secondary | ICD-10-CM | POA: Diagnosis not present

## 2020-05-08 DIAGNOSIS — N183 Chronic kidney disease, stage 3 unspecified: Secondary | ICD-10-CM | POA: Diagnosis not present

## 2020-05-08 DIAGNOSIS — K219 Gastro-esophageal reflux disease without esophagitis: Secondary | ICD-10-CM | POA: Diagnosis not present

## 2020-05-15 ENCOUNTER — Other Ambulatory Visit: Payer: Self-pay | Admitting: *Deleted

## 2020-05-24 DIAGNOSIS — J449 Chronic obstructive pulmonary disease, unspecified: Secondary | ICD-10-CM | POA: Diagnosis not present

## 2020-06-10 DIAGNOSIS — M179 Osteoarthritis of knee, unspecified: Secondary | ICD-10-CM | POA: Diagnosis not present

## 2020-06-10 DIAGNOSIS — E1140 Type 2 diabetes mellitus with diabetic neuropathy, unspecified: Secondary | ICD-10-CM | POA: Diagnosis not present

## 2020-06-10 DIAGNOSIS — M1990 Unspecified osteoarthritis, unspecified site: Secondary | ICD-10-CM | POA: Diagnosis not present

## 2020-06-10 DIAGNOSIS — G4700 Insomnia, unspecified: Secondary | ICD-10-CM | POA: Diagnosis not present

## 2020-06-10 DIAGNOSIS — E1165 Type 2 diabetes mellitus with hyperglycemia: Secondary | ICD-10-CM | POA: Diagnosis not present

## 2020-06-10 DIAGNOSIS — J449 Chronic obstructive pulmonary disease, unspecified: Secondary | ICD-10-CM | POA: Diagnosis not present

## 2020-06-10 DIAGNOSIS — N183 Chronic kidney disease, stage 3 unspecified: Secondary | ICD-10-CM | POA: Diagnosis not present

## 2020-06-10 DIAGNOSIS — K219 Gastro-esophageal reflux disease without esophagitis: Secondary | ICD-10-CM | POA: Diagnosis not present

## 2020-06-10 DIAGNOSIS — I10 Essential (primary) hypertension: Secondary | ICD-10-CM | POA: Diagnosis not present

## 2020-06-10 DIAGNOSIS — D509 Iron deficiency anemia, unspecified: Secondary | ICD-10-CM | POA: Diagnosis not present

## 2020-06-10 DIAGNOSIS — E782 Mixed hyperlipidemia: Secondary | ICD-10-CM | POA: Diagnosis not present

## 2020-06-18 DIAGNOSIS — E1165 Type 2 diabetes mellitus with hyperglycemia: Secondary | ICD-10-CM | POA: Diagnosis not present

## 2020-06-18 DIAGNOSIS — E782 Mixed hyperlipidemia: Secondary | ICD-10-CM | POA: Diagnosis not present

## 2020-06-18 DIAGNOSIS — D509 Iron deficiency anemia, unspecified: Secondary | ICD-10-CM | POA: Diagnosis not present

## 2020-06-18 DIAGNOSIS — G4700 Insomnia, unspecified: Secondary | ICD-10-CM | POA: Diagnosis not present

## 2020-06-18 DIAGNOSIS — K219 Gastro-esophageal reflux disease without esophagitis: Secondary | ICD-10-CM | POA: Diagnosis not present

## 2020-06-18 DIAGNOSIS — M1990 Unspecified osteoarthritis, unspecified site: Secondary | ICD-10-CM | POA: Diagnosis not present

## 2020-06-18 DIAGNOSIS — E1140 Type 2 diabetes mellitus with diabetic neuropathy, unspecified: Secondary | ICD-10-CM | POA: Diagnosis not present

## 2020-06-18 DIAGNOSIS — N183 Chronic kidney disease, stage 3 unspecified: Secondary | ICD-10-CM | POA: Diagnosis not present

## 2020-06-18 DIAGNOSIS — I10 Essential (primary) hypertension: Secondary | ICD-10-CM | POA: Diagnosis not present

## 2020-06-18 DIAGNOSIS — M179 Osteoarthritis of knee, unspecified: Secondary | ICD-10-CM | POA: Diagnosis not present

## 2020-06-18 DIAGNOSIS — J449 Chronic obstructive pulmonary disease, unspecified: Secondary | ICD-10-CM | POA: Diagnosis not present

## 2020-06-26 ENCOUNTER — Ambulatory Visit: Payer: HMO | Admitting: *Deleted

## 2020-07-08 DIAGNOSIS — M25512 Pain in left shoulder: Secondary | ICD-10-CM | POA: Diagnosis not present

## 2020-07-09 DIAGNOSIS — M940 Chondrocostal junction syndrome [Tietze]: Secondary | ICD-10-CM | POA: Diagnosis not present

## 2020-08-02 DIAGNOSIS — M179 Osteoarthritis of knee, unspecified: Secondary | ICD-10-CM | POA: Diagnosis not present

## 2020-08-02 DIAGNOSIS — M1990 Unspecified osteoarthritis, unspecified site: Secondary | ICD-10-CM | POA: Diagnosis not present

## 2020-08-02 DIAGNOSIS — K219 Gastro-esophageal reflux disease without esophagitis: Secondary | ICD-10-CM | POA: Diagnosis not present

## 2020-08-02 DIAGNOSIS — N183 Chronic kidney disease, stage 3 unspecified: Secondary | ICD-10-CM | POA: Diagnosis not present

## 2020-08-02 DIAGNOSIS — E1165 Type 2 diabetes mellitus with hyperglycemia: Secondary | ICD-10-CM | POA: Diagnosis not present

## 2020-08-02 DIAGNOSIS — E1140 Type 2 diabetes mellitus with diabetic neuropathy, unspecified: Secondary | ICD-10-CM | POA: Diagnosis not present

## 2020-08-02 DIAGNOSIS — J449 Chronic obstructive pulmonary disease, unspecified: Secondary | ICD-10-CM | POA: Diagnosis not present

## 2020-08-02 DIAGNOSIS — D509 Iron deficiency anemia, unspecified: Secondary | ICD-10-CM | POA: Diagnosis not present

## 2020-08-02 DIAGNOSIS — G4700 Insomnia, unspecified: Secondary | ICD-10-CM | POA: Diagnosis not present

## 2020-08-02 DIAGNOSIS — I10 Essential (primary) hypertension: Secondary | ICD-10-CM | POA: Diagnosis not present

## 2020-08-02 DIAGNOSIS — E782 Mixed hyperlipidemia: Secondary | ICD-10-CM | POA: Diagnosis not present

## 2020-08-05 DIAGNOSIS — N183 Chronic kidney disease, stage 3 unspecified: Secondary | ICD-10-CM | POA: Diagnosis not present

## 2020-08-05 DIAGNOSIS — J449 Chronic obstructive pulmonary disease, unspecified: Secondary | ICD-10-CM | POA: Diagnosis not present

## 2020-08-05 DIAGNOSIS — I10 Essential (primary) hypertension: Secondary | ICD-10-CM | POA: Diagnosis not present

## 2020-08-05 DIAGNOSIS — D509 Iron deficiency anemia, unspecified: Secondary | ICD-10-CM | POA: Diagnosis not present

## 2020-08-05 DIAGNOSIS — R5382 Chronic fatigue, unspecified: Secondary | ICD-10-CM | POA: Diagnosis not present

## 2020-08-05 DIAGNOSIS — Z23 Encounter for immunization: Secondary | ICD-10-CM | POA: Diagnosis not present

## 2020-08-05 DIAGNOSIS — E782 Mixed hyperlipidemia: Secondary | ICD-10-CM | POA: Diagnosis not present

## 2020-08-05 DIAGNOSIS — M79675 Pain in left toe(s): Secondary | ICD-10-CM | POA: Diagnosis not present

## 2020-08-05 DIAGNOSIS — E1140 Type 2 diabetes mellitus with diabetic neuropathy, unspecified: Secondary | ICD-10-CM | POA: Diagnosis not present

## 2020-08-05 DIAGNOSIS — M549 Dorsalgia, unspecified: Secondary | ICD-10-CM | POA: Diagnosis not present

## 2020-08-05 DIAGNOSIS — Z7984 Long term (current) use of oral hypoglycemic drugs: Secondary | ICD-10-CM | POA: Diagnosis not present

## 2020-08-13 DIAGNOSIS — H43813 Vitreous degeneration, bilateral: Secondary | ICD-10-CM | POA: Diagnosis not present

## 2020-08-13 DIAGNOSIS — H43393 Other vitreous opacities, bilateral: Secondary | ICD-10-CM | POA: Diagnosis not present

## 2020-08-13 DIAGNOSIS — H52223 Regular astigmatism, bilateral: Secondary | ICD-10-CM | POA: Diagnosis not present

## 2020-08-13 DIAGNOSIS — H5203 Hypermetropia, bilateral: Secondary | ICD-10-CM | POA: Diagnosis not present

## 2020-08-13 DIAGNOSIS — H524 Presbyopia: Secondary | ICD-10-CM | POA: Diagnosis not present

## 2020-09-04 DIAGNOSIS — I10 Essential (primary) hypertension: Secondary | ICD-10-CM | POA: Diagnosis not present

## 2020-09-04 DIAGNOSIS — E1165 Type 2 diabetes mellitus with hyperglycemia: Secondary | ICD-10-CM | POA: Diagnosis not present

## 2020-09-04 DIAGNOSIS — K219 Gastro-esophageal reflux disease without esophagitis: Secondary | ICD-10-CM | POA: Diagnosis not present

## 2020-09-04 DIAGNOSIS — D509 Iron deficiency anemia, unspecified: Secondary | ICD-10-CM | POA: Diagnosis not present

## 2020-09-04 DIAGNOSIS — E782 Mixed hyperlipidemia: Secondary | ICD-10-CM | POA: Diagnosis not present

## 2020-09-04 DIAGNOSIS — E1140 Type 2 diabetes mellitus with diabetic neuropathy, unspecified: Secondary | ICD-10-CM | POA: Diagnosis not present

## 2020-09-04 DIAGNOSIS — M1990 Unspecified osteoarthritis, unspecified site: Secondary | ICD-10-CM | POA: Diagnosis not present

## 2020-09-04 DIAGNOSIS — M179 Osteoarthritis of knee, unspecified: Secondary | ICD-10-CM | POA: Diagnosis not present

## 2020-09-04 DIAGNOSIS — G4700 Insomnia, unspecified: Secondary | ICD-10-CM | POA: Diagnosis not present

## 2020-09-04 DIAGNOSIS — N183 Chronic kidney disease, stage 3 unspecified: Secondary | ICD-10-CM | POA: Diagnosis not present

## 2020-09-04 DIAGNOSIS — J449 Chronic obstructive pulmonary disease, unspecified: Secondary | ICD-10-CM | POA: Diagnosis not present

## 2020-09-05 DIAGNOSIS — M5416 Radiculopathy, lumbar region: Secondary | ICD-10-CM | POA: Diagnosis not present

## 2020-09-16 ENCOUNTER — Other Ambulatory Visit: Payer: Self-pay | Admitting: Internal Medicine

## 2020-09-16 DIAGNOSIS — Z1231 Encounter for screening mammogram for malignant neoplasm of breast: Secondary | ICD-10-CM

## 2020-10-02 DIAGNOSIS — M25551 Pain in right hip: Secondary | ICD-10-CM | POA: Diagnosis not present

## 2020-10-02 DIAGNOSIS — Z96651 Presence of right artificial knee joint: Secondary | ICD-10-CM | POA: Diagnosis not present

## 2020-10-04 DIAGNOSIS — M5136 Other intervertebral disc degeneration, lumbar region: Secondary | ICD-10-CM | POA: Diagnosis not present

## 2020-10-04 DIAGNOSIS — Z9889 Other specified postprocedural states: Secondary | ICD-10-CM | POA: Diagnosis not present

## 2020-10-04 DIAGNOSIS — M25512 Pain in left shoulder: Secondary | ICD-10-CM | POA: Diagnosis not present

## 2020-10-08 DIAGNOSIS — K219 Gastro-esophageal reflux disease without esophagitis: Secondary | ICD-10-CM | POA: Diagnosis not present

## 2020-10-08 DIAGNOSIS — M1990 Unspecified osteoarthritis, unspecified site: Secondary | ICD-10-CM | POA: Diagnosis not present

## 2020-10-08 DIAGNOSIS — I10 Essential (primary) hypertension: Secondary | ICD-10-CM | POA: Diagnosis not present

## 2020-10-08 DIAGNOSIS — N183 Chronic kidney disease, stage 3 unspecified: Secondary | ICD-10-CM | POA: Diagnosis not present

## 2020-10-08 DIAGNOSIS — E1140 Type 2 diabetes mellitus with diabetic neuropathy, unspecified: Secondary | ICD-10-CM | POA: Diagnosis not present

## 2020-10-08 DIAGNOSIS — J449 Chronic obstructive pulmonary disease, unspecified: Secondary | ICD-10-CM | POA: Diagnosis not present

## 2020-10-08 DIAGNOSIS — G4700 Insomnia, unspecified: Secondary | ICD-10-CM | POA: Diagnosis not present

## 2020-10-08 DIAGNOSIS — E782 Mixed hyperlipidemia: Secondary | ICD-10-CM | POA: Diagnosis not present

## 2020-10-08 DIAGNOSIS — E1165 Type 2 diabetes mellitus with hyperglycemia: Secondary | ICD-10-CM | POA: Diagnosis not present

## 2020-10-08 DIAGNOSIS — D509 Iron deficiency anemia, unspecified: Secondary | ICD-10-CM | POA: Diagnosis not present

## 2020-10-17 ENCOUNTER — Other Ambulatory Visit: Payer: Self-pay | Admitting: Neurological Surgery

## 2020-10-17 DIAGNOSIS — R269 Unspecified abnormalities of gait and mobility: Secondary | ICD-10-CM | POA: Diagnosis not present

## 2020-10-17 DIAGNOSIS — M545 Low back pain, unspecified: Secondary | ICD-10-CM | POA: Diagnosis not present

## 2020-10-17 DIAGNOSIS — Z6832 Body mass index (BMI) 32.0-32.9, adult: Secondary | ICD-10-CM | POA: Diagnosis not present

## 2020-10-17 DIAGNOSIS — I10 Essential (primary) hypertension: Secondary | ICD-10-CM | POA: Diagnosis not present

## 2020-10-18 ENCOUNTER — Telehealth: Payer: Self-pay

## 2020-10-18 NOTE — Telephone Encounter (Signed)
Phone call to patient to verify medication list and allergies for myelogram procedure. Medications pt is currently taking are safe to continue to take for this procedure. Pt also instructed to have a driver the day of the procedure and discharge instructions reviewed. Pt verbalized understanding.   Pt also reports she has a spinal cord stimulator. Advised pt to bring the remote the day of the procedure so we could place it in "procedure" mode while she goes through the CT scan. Pt verbalized understanding.

## 2020-10-24 DIAGNOSIS — E1165 Type 2 diabetes mellitus with hyperglycemia: Secondary | ICD-10-CM | POA: Diagnosis not present

## 2020-10-24 DIAGNOSIS — I700 Atherosclerosis of aorta: Secondary | ICD-10-CM | POA: Diagnosis not present

## 2020-10-24 DIAGNOSIS — Z7984 Long term (current) use of oral hypoglycemic drugs: Secondary | ICD-10-CM | POA: Diagnosis not present

## 2020-10-24 DIAGNOSIS — E782 Mixed hyperlipidemia: Secondary | ICD-10-CM | POA: Diagnosis not present

## 2020-10-24 DIAGNOSIS — R252 Cramp and spasm: Secondary | ICD-10-CM | POA: Diagnosis not present

## 2020-10-24 DIAGNOSIS — R5382 Chronic fatigue, unspecified: Secondary | ICD-10-CM | POA: Diagnosis not present

## 2020-10-24 DIAGNOSIS — I10 Essential (primary) hypertension: Secondary | ICD-10-CM | POA: Diagnosis not present

## 2020-10-24 DIAGNOSIS — Z79899 Other long term (current) drug therapy: Secondary | ICD-10-CM | POA: Diagnosis not present

## 2020-10-24 DIAGNOSIS — N183 Chronic kidney disease, stage 3 unspecified: Secondary | ICD-10-CM | POA: Diagnosis not present

## 2020-10-24 DIAGNOSIS — R269 Unspecified abnormalities of gait and mobility: Secondary | ICD-10-CM | POA: Diagnosis not present

## 2020-10-24 DIAGNOSIS — D509 Iron deficiency anemia, unspecified: Secondary | ICD-10-CM | POA: Diagnosis not present

## 2020-10-24 DIAGNOSIS — J449 Chronic obstructive pulmonary disease, unspecified: Secondary | ICD-10-CM | POA: Diagnosis not present

## 2020-10-24 DIAGNOSIS — G4733 Obstructive sleep apnea (adult) (pediatric): Secondary | ICD-10-CM | POA: Diagnosis not present

## 2020-10-24 DIAGNOSIS — E559 Vitamin D deficiency, unspecified: Secondary | ICD-10-CM | POA: Diagnosis not present

## 2020-10-24 DIAGNOSIS — Z1389 Encounter for screening for other disorder: Secondary | ICD-10-CM | POA: Diagnosis not present

## 2020-10-24 DIAGNOSIS — Z0000 Encounter for general adult medical examination without abnormal findings: Secondary | ICD-10-CM | POA: Diagnosis not present

## 2020-10-24 DIAGNOSIS — N1831 Chronic kidney disease, stage 3a: Secondary | ICD-10-CM | POA: Diagnosis not present

## 2020-10-24 DIAGNOSIS — E1140 Type 2 diabetes mellitus with diabetic neuropathy, unspecified: Secondary | ICD-10-CM | POA: Diagnosis not present

## 2020-10-24 DIAGNOSIS — M79675 Pain in left toe(s): Secondary | ICD-10-CM | POA: Diagnosis not present

## 2020-10-31 DIAGNOSIS — G4700 Insomnia, unspecified: Secondary | ICD-10-CM | POA: Diagnosis not present

## 2020-10-31 DIAGNOSIS — E1165 Type 2 diabetes mellitus with hyperglycemia: Secondary | ICD-10-CM | POA: Diagnosis not present

## 2020-10-31 DIAGNOSIS — I10 Essential (primary) hypertension: Secondary | ICD-10-CM | POA: Diagnosis not present

## 2020-10-31 DIAGNOSIS — D509 Iron deficiency anemia, unspecified: Secondary | ICD-10-CM | POA: Diagnosis not present

## 2020-10-31 DIAGNOSIS — M1990 Unspecified osteoarthritis, unspecified site: Secondary | ICD-10-CM | POA: Diagnosis not present

## 2020-10-31 DIAGNOSIS — K219 Gastro-esophageal reflux disease without esophagitis: Secondary | ICD-10-CM | POA: Diagnosis not present

## 2020-10-31 DIAGNOSIS — M179 Osteoarthritis of knee, unspecified: Secondary | ICD-10-CM | POA: Diagnosis not present

## 2020-10-31 DIAGNOSIS — E1140 Type 2 diabetes mellitus with diabetic neuropathy, unspecified: Secondary | ICD-10-CM | POA: Diagnosis not present

## 2020-10-31 DIAGNOSIS — N183 Chronic kidney disease, stage 3 unspecified: Secondary | ICD-10-CM | POA: Diagnosis not present

## 2020-10-31 DIAGNOSIS — E782 Mixed hyperlipidemia: Secondary | ICD-10-CM | POA: Diagnosis not present

## 2020-10-31 DIAGNOSIS — J449 Chronic obstructive pulmonary disease, unspecified: Secondary | ICD-10-CM | POA: Diagnosis not present

## 2020-11-06 ENCOUNTER — Ambulatory Visit
Admission: RE | Admit: 2020-11-06 | Discharge: 2020-11-06 | Disposition: A | Discharge status: Home / Self Care | Payer: HMO | Source: Ambulatory Visit | Attending: Neurological Surgery | Admitting: Neurological Surgery

## 2020-11-06 ENCOUNTER — Other Ambulatory Visit: Payer: Self-pay

## 2020-11-06 DIAGNOSIS — M2578 Osteophyte, vertebrae: Secondary | ICD-10-CM | POA: Diagnosis not present

## 2020-11-06 DIAGNOSIS — R269 Unspecified abnormalities of gait and mobility: Secondary | ICD-10-CM

## 2020-11-06 DIAGNOSIS — M4326 Fusion of spine, lumbar region: Secondary | ICD-10-CM | POA: Diagnosis not present

## 2020-11-06 DIAGNOSIS — M5136 Other intervertebral disc degeneration, lumbar region: Secondary | ICD-10-CM | POA: Diagnosis not present

## 2020-11-06 DIAGNOSIS — M5134 Other intervertebral disc degeneration, thoracic region: Secondary | ICD-10-CM | POA: Diagnosis not present

## 2020-11-06 MED ORDER — DIAZEPAM 5 MG PO TABS
5.0000 mg | ORAL_TABLET | Freq: Once | ORAL | Status: AC
  Administered 2020-11-06: 5 mg via ORAL

## 2020-11-06 MED ORDER — IOPAMIDOL (ISOVUE-M 300) INJECTION 61%
10.0000 mL | Freq: Once | INTRAMUSCULAR | Status: AC | PRN
  Administered 2020-11-06: 10 mL via INTRATHECAL

## 2020-11-06 NOTE — Discharge Instructions (Signed)

## 2020-11-12 ENCOUNTER — Ambulatory Visit
Admission: RE | Admit: 2020-11-12 | Discharge: 2020-11-12 | Disposition: A | Discharge status: Home / Self Care | Payer: HMO | Source: Ambulatory Visit | Attending: Internal Medicine | Admitting: Internal Medicine

## 2020-11-12 ENCOUNTER — Other Ambulatory Visit: Payer: Self-pay

## 2020-11-12 DIAGNOSIS — Z1231 Encounter for screening mammogram for malignant neoplasm of breast: Secondary | ICD-10-CM

## 2020-11-12 DIAGNOSIS — M25512 Pain in left shoulder: Secondary | ICD-10-CM | POA: Diagnosis not present

## 2020-11-12 DIAGNOSIS — Z9889 Other specified postprocedural states: Secondary | ICD-10-CM | POA: Diagnosis not present

## 2020-11-12 DIAGNOSIS — M5136 Other intervertebral disc degeneration, lumbar region: Secondary | ICD-10-CM | POA: Diagnosis not present

## 2020-11-20 DIAGNOSIS — H25819 Combined forms of age-related cataract, unspecified eye: Secondary | ICD-10-CM | POA: Diagnosis not present

## 2020-11-20 DIAGNOSIS — H5203 Hypermetropia, bilateral: Secondary | ICD-10-CM | POA: Diagnosis not present

## 2020-11-20 DIAGNOSIS — H353131 Nonexudative age-related macular degeneration, bilateral, early dry stage: Secondary | ICD-10-CM | POA: Diagnosis not present

## 2020-11-20 DIAGNOSIS — H35033 Hypertensive retinopathy, bilateral: Secondary | ICD-10-CM | POA: Diagnosis not present

## 2020-11-20 DIAGNOSIS — E119 Type 2 diabetes mellitus without complications: Secondary | ICD-10-CM | POA: Diagnosis not present

## 2020-11-20 DIAGNOSIS — H52223 Regular astigmatism, bilateral: Secondary | ICD-10-CM | POA: Diagnosis not present

## 2020-11-20 DIAGNOSIS — H524 Presbyopia: Secondary | ICD-10-CM | POA: Diagnosis not present

## 2020-11-20 DIAGNOSIS — I10 Essential (primary) hypertension: Secondary | ICD-10-CM | POA: Diagnosis not present

## 2020-11-21 DIAGNOSIS — Z6832 Body mass index (BMI) 32.0-32.9, adult: Secondary | ICD-10-CM | POA: Diagnosis not present

## 2020-11-21 DIAGNOSIS — R269 Unspecified abnormalities of gait and mobility: Secondary | ICD-10-CM | POA: Diagnosis not present

## 2020-11-21 DIAGNOSIS — I10 Essential (primary) hypertension: Secondary | ICD-10-CM | POA: Diagnosis not present

## 2020-12-02 DIAGNOSIS — M6281 Muscle weakness (generalized): Secondary | ICD-10-CM | POA: Diagnosis not present

## 2020-12-02 DIAGNOSIS — M79604 Pain in right leg: Secondary | ICD-10-CM | POA: Diagnosis not present

## 2020-12-02 DIAGNOSIS — R262 Difficulty in walking, not elsewhere classified: Secondary | ICD-10-CM | POA: Diagnosis not present

## 2020-12-02 DIAGNOSIS — M79605 Pain in left leg: Secondary | ICD-10-CM | POA: Diagnosis not present

## 2020-12-09 DIAGNOSIS — R262 Difficulty in walking, not elsewhere classified: Secondary | ICD-10-CM | POA: Diagnosis not present

## 2020-12-09 DIAGNOSIS — M79605 Pain in left leg: Secondary | ICD-10-CM | POA: Diagnosis not present

## 2020-12-09 DIAGNOSIS — M6281 Muscle weakness (generalized): Secondary | ICD-10-CM | POA: Diagnosis not present

## 2020-12-09 DIAGNOSIS — M79604 Pain in right leg: Secondary | ICD-10-CM | POA: Diagnosis not present

## 2020-12-11 DIAGNOSIS — M79604 Pain in right leg: Secondary | ICD-10-CM | POA: Diagnosis not present

## 2020-12-11 DIAGNOSIS — M79605 Pain in left leg: Secondary | ICD-10-CM | POA: Diagnosis not present

## 2020-12-11 DIAGNOSIS — M6281 Muscle weakness (generalized): Secondary | ICD-10-CM | POA: Diagnosis not present

## 2020-12-11 DIAGNOSIS — R262 Difficulty in walking, not elsewhere classified: Secondary | ICD-10-CM | POA: Diagnosis not present

## 2020-12-16 DIAGNOSIS — M79604 Pain in right leg: Secondary | ICD-10-CM | POA: Diagnosis not present

## 2020-12-16 DIAGNOSIS — M6281 Muscle weakness (generalized): Secondary | ICD-10-CM | POA: Diagnosis not present

## 2020-12-16 DIAGNOSIS — M79605 Pain in left leg: Secondary | ICD-10-CM | POA: Diagnosis not present

## 2020-12-16 DIAGNOSIS — R262 Difficulty in walking, not elsewhere classified: Secondary | ICD-10-CM | POA: Diagnosis not present

## 2020-12-18 DIAGNOSIS — R262 Difficulty in walking, not elsewhere classified: Secondary | ICD-10-CM | POA: Diagnosis not present

## 2020-12-18 DIAGNOSIS — M6281 Muscle weakness (generalized): Secondary | ICD-10-CM | POA: Diagnosis not present

## 2020-12-18 DIAGNOSIS — M79605 Pain in left leg: Secondary | ICD-10-CM | POA: Diagnosis not present

## 2020-12-18 DIAGNOSIS — M79604 Pain in right leg: Secondary | ICD-10-CM | POA: Diagnosis not present

## 2021-01-02 DIAGNOSIS — K219 Gastro-esophageal reflux disease without esophagitis: Secondary | ICD-10-CM | POA: Diagnosis not present

## 2021-01-02 DIAGNOSIS — M79605 Pain in left leg: Secondary | ICD-10-CM | POA: Diagnosis not present

## 2021-01-02 DIAGNOSIS — G4700 Insomnia, unspecified: Secondary | ICD-10-CM | POA: Diagnosis not present

## 2021-01-02 DIAGNOSIS — E1140 Type 2 diabetes mellitus with diabetic neuropathy, unspecified: Secondary | ICD-10-CM | POA: Diagnosis not present

## 2021-01-02 DIAGNOSIS — E782 Mixed hyperlipidemia: Secondary | ICD-10-CM | POA: Diagnosis not present

## 2021-01-02 DIAGNOSIS — M179 Osteoarthritis of knee, unspecified: Secondary | ICD-10-CM | POA: Diagnosis not present

## 2021-01-02 DIAGNOSIS — N183 Chronic kidney disease, stage 3 unspecified: Secondary | ICD-10-CM | POA: Diagnosis not present

## 2021-01-02 DIAGNOSIS — E1165 Type 2 diabetes mellitus with hyperglycemia: Secondary | ICD-10-CM | POA: Diagnosis not present

## 2021-01-02 DIAGNOSIS — R262 Difficulty in walking, not elsewhere classified: Secondary | ICD-10-CM | POA: Diagnosis not present

## 2021-01-02 DIAGNOSIS — M1990 Unspecified osteoarthritis, unspecified site: Secondary | ICD-10-CM | POA: Diagnosis not present

## 2021-01-02 DIAGNOSIS — M79604 Pain in right leg: Secondary | ICD-10-CM | POA: Diagnosis not present

## 2021-01-02 DIAGNOSIS — I10 Essential (primary) hypertension: Secondary | ICD-10-CM | POA: Diagnosis not present

## 2021-01-02 DIAGNOSIS — D509 Iron deficiency anemia, unspecified: Secondary | ICD-10-CM | POA: Diagnosis not present

## 2021-01-02 DIAGNOSIS — J449 Chronic obstructive pulmonary disease, unspecified: Secondary | ICD-10-CM | POA: Diagnosis not present

## 2021-01-02 DIAGNOSIS — M6281 Muscle weakness (generalized): Secondary | ICD-10-CM | POA: Diagnosis not present

## 2021-01-07 DIAGNOSIS — R262 Difficulty in walking, not elsewhere classified: Secondary | ICD-10-CM | POA: Diagnosis not present

## 2021-01-07 DIAGNOSIS — M6281 Muscle weakness (generalized): Secondary | ICD-10-CM | POA: Diagnosis not present

## 2021-01-07 DIAGNOSIS — M79605 Pain in left leg: Secondary | ICD-10-CM | POA: Diagnosis not present

## 2021-01-07 DIAGNOSIS — M79604 Pain in right leg: Secondary | ICD-10-CM | POA: Diagnosis not present

## 2021-01-16 DIAGNOSIS — M79605 Pain in left leg: Secondary | ICD-10-CM | POA: Diagnosis not present

## 2021-01-16 DIAGNOSIS — R262 Difficulty in walking, not elsewhere classified: Secondary | ICD-10-CM | POA: Diagnosis not present

## 2021-01-16 DIAGNOSIS — M79604 Pain in right leg: Secondary | ICD-10-CM | POA: Diagnosis not present

## 2021-01-16 DIAGNOSIS — M6281 Muscle weakness (generalized): Secondary | ICD-10-CM | POA: Diagnosis not present

## 2021-01-23 DIAGNOSIS — M79675 Pain in left toe(s): Secondary | ICD-10-CM | POA: Diagnosis not present

## 2021-01-23 DIAGNOSIS — Z23 Encounter for immunization: Secondary | ICD-10-CM | POA: Diagnosis not present

## 2021-01-23 DIAGNOSIS — R252 Cramp and spasm: Secondary | ICD-10-CM | POA: Diagnosis not present

## 2021-01-23 DIAGNOSIS — I10 Essential (primary) hypertension: Secondary | ICD-10-CM | POA: Diagnosis not present

## 2021-01-23 DIAGNOSIS — R61 Generalized hyperhidrosis: Secondary | ICD-10-CM | POA: Diagnosis not present

## 2021-01-23 DIAGNOSIS — Z7984 Long term (current) use of oral hypoglycemic drugs: Secondary | ICD-10-CM | POA: Diagnosis not present

## 2021-01-23 DIAGNOSIS — M549 Dorsalgia, unspecified: Secondary | ICD-10-CM | POA: Diagnosis not present

## 2021-01-23 DIAGNOSIS — R269 Unspecified abnormalities of gait and mobility: Secondary | ICD-10-CM | POA: Diagnosis not present

## 2021-01-23 DIAGNOSIS — E1140 Type 2 diabetes mellitus with diabetic neuropathy, unspecified: Secondary | ICD-10-CM | POA: Diagnosis not present

## 2021-01-28 DIAGNOSIS — R262 Difficulty in walking, not elsewhere classified: Secondary | ICD-10-CM | POA: Diagnosis not present

## 2021-01-28 DIAGNOSIS — M6281 Muscle weakness (generalized): Secondary | ICD-10-CM | POA: Diagnosis not present

## 2021-01-28 DIAGNOSIS — M79605 Pain in left leg: Secondary | ICD-10-CM | POA: Diagnosis not present

## 2021-01-28 DIAGNOSIS — M79604 Pain in right leg: Secondary | ICD-10-CM | POA: Diagnosis not present

## 2021-01-30 DIAGNOSIS — M79605 Pain in left leg: Secondary | ICD-10-CM | POA: Diagnosis not present

## 2021-01-30 DIAGNOSIS — R262 Difficulty in walking, not elsewhere classified: Secondary | ICD-10-CM | POA: Diagnosis not present

## 2021-01-30 DIAGNOSIS — M79604 Pain in right leg: Secondary | ICD-10-CM | POA: Diagnosis not present

## 2021-01-30 DIAGNOSIS — M6281 Muscle weakness (generalized): Secondary | ICD-10-CM | POA: Diagnosis not present

## 2021-02-03 DIAGNOSIS — M6281 Muscle weakness (generalized): Secondary | ICD-10-CM | POA: Diagnosis not present

## 2021-02-03 DIAGNOSIS — M79604 Pain in right leg: Secondary | ICD-10-CM | POA: Diagnosis not present

## 2021-02-03 DIAGNOSIS — M79605 Pain in left leg: Secondary | ICD-10-CM | POA: Diagnosis not present

## 2021-02-03 DIAGNOSIS — R262 Difficulty in walking, not elsewhere classified: Secondary | ICD-10-CM | POA: Diagnosis not present

## 2021-02-05 DIAGNOSIS — M79604 Pain in right leg: Secondary | ICD-10-CM | POA: Diagnosis not present

## 2021-02-05 DIAGNOSIS — M6281 Muscle weakness (generalized): Secondary | ICD-10-CM | POA: Diagnosis not present

## 2021-02-05 DIAGNOSIS — R262 Difficulty in walking, not elsewhere classified: Secondary | ICD-10-CM | POA: Diagnosis not present

## 2021-02-05 DIAGNOSIS — M79605 Pain in left leg: Secondary | ICD-10-CM | POA: Diagnosis not present

## 2021-02-17 DIAGNOSIS — M25519 Pain in unspecified shoulder: Secondary | ICD-10-CM | POA: Diagnosis not present

## 2021-02-17 DIAGNOSIS — M25511 Pain in right shoulder: Secondary | ICD-10-CM | POA: Diagnosis not present

## 2021-02-17 DIAGNOSIS — M25512 Pain in left shoulder: Secondary | ICD-10-CM | POA: Diagnosis not present

## 2021-02-27 DIAGNOSIS — R269 Unspecified abnormalities of gait and mobility: Secondary | ICD-10-CM | POA: Diagnosis not present

## 2021-02-27 DIAGNOSIS — R61 Generalized hyperhidrosis: Secondary | ICD-10-CM | POA: Diagnosis not present

## 2021-02-27 DIAGNOSIS — E1140 Type 2 diabetes mellitus with diabetic neuropathy, unspecified: Secondary | ICD-10-CM | POA: Diagnosis not present

## 2021-02-27 DIAGNOSIS — I700 Atherosclerosis of aorta: Secondary | ICD-10-CM | POA: Diagnosis not present

## 2021-02-27 DIAGNOSIS — G4733 Obstructive sleep apnea (adult) (pediatric): Secondary | ICD-10-CM | POA: Diagnosis not present

## 2021-02-27 DIAGNOSIS — N183 Chronic kidney disease, stage 3 unspecified: Secondary | ICD-10-CM | POA: Diagnosis not present

## 2021-02-27 DIAGNOSIS — M79675 Pain in left toe(s): Secondary | ICD-10-CM | POA: Diagnosis not present

## 2021-02-27 DIAGNOSIS — Z7984 Long term (current) use of oral hypoglycemic drugs: Secondary | ICD-10-CM | POA: Diagnosis not present

## 2021-02-27 DIAGNOSIS — Z86718 Personal history of other venous thrombosis and embolism: Secondary | ICD-10-CM | POA: Diagnosis not present

## 2021-02-27 DIAGNOSIS — I10 Essential (primary) hypertension: Secondary | ICD-10-CM | POA: Diagnosis not present

## 2021-02-27 DIAGNOSIS — Z791 Long term (current) use of non-steroidal anti-inflammatories (NSAID): Secondary | ICD-10-CM | POA: Diagnosis not present

## 2021-02-27 DIAGNOSIS — R252 Cramp and spasm: Secondary | ICD-10-CM | POA: Diagnosis not present

## 2021-03-06 DIAGNOSIS — D225 Melanocytic nevi of trunk: Secondary | ICD-10-CM | POA: Diagnosis not present

## 2021-03-06 DIAGNOSIS — L821 Other seborrheic keratosis: Secondary | ICD-10-CM | POA: Diagnosis not present

## 2021-03-06 DIAGNOSIS — Z85828 Personal history of other malignant neoplasm of skin: Secondary | ICD-10-CM | POA: Diagnosis not present

## 2021-03-22 DIAGNOSIS — G8929 Other chronic pain: Secondary | ICD-10-CM | POA: Diagnosis not present

## 2021-03-22 DIAGNOSIS — M961 Postlaminectomy syndrome, not elsewhere classified: Secondary | ICD-10-CM | POA: Diagnosis not present

## 2021-04-07 DIAGNOSIS — M79642 Pain in left hand: Secondary | ICD-10-CM | POA: Diagnosis not present

## 2021-04-07 DIAGNOSIS — M25532 Pain in left wrist: Secondary | ICD-10-CM | POA: Diagnosis not present

## 2021-04-08 DIAGNOSIS — N183 Chronic kidney disease, stage 3 unspecified: Secondary | ICD-10-CM | POA: Diagnosis not present

## 2021-04-08 DIAGNOSIS — J449 Chronic obstructive pulmonary disease, unspecified: Secondary | ICD-10-CM | POA: Diagnosis not present

## 2021-04-08 DIAGNOSIS — E782 Mixed hyperlipidemia: Secondary | ICD-10-CM | POA: Diagnosis not present

## 2021-04-08 DIAGNOSIS — E1140 Type 2 diabetes mellitus with diabetic neuropathy, unspecified: Secondary | ICD-10-CM | POA: Diagnosis not present

## 2021-04-08 DIAGNOSIS — G4700 Insomnia, unspecified: Secondary | ICD-10-CM | POA: Diagnosis not present

## 2021-04-08 DIAGNOSIS — M179 Osteoarthritis of knee, unspecified: Secondary | ICD-10-CM | POA: Diagnosis not present

## 2021-04-08 DIAGNOSIS — E1165 Type 2 diabetes mellitus with hyperglycemia: Secondary | ICD-10-CM | POA: Diagnosis not present

## 2021-04-08 DIAGNOSIS — D509 Iron deficiency anemia, unspecified: Secondary | ICD-10-CM | POA: Diagnosis not present

## 2021-04-08 DIAGNOSIS — M1990 Unspecified osteoarthritis, unspecified site: Secondary | ICD-10-CM | POA: Diagnosis not present

## 2021-04-08 DIAGNOSIS — I10 Essential (primary) hypertension: Secondary | ICD-10-CM | POA: Diagnosis not present

## 2021-04-08 DIAGNOSIS — K219 Gastro-esophageal reflux disease without esophagitis: Secondary | ICD-10-CM | POA: Diagnosis not present

## 2021-04-21 DIAGNOSIS — Z791 Long term (current) use of non-steroidal anti-inflammatories (NSAID): Secondary | ICD-10-CM | POA: Diagnosis not present

## 2021-04-21 DIAGNOSIS — E1140 Type 2 diabetes mellitus with diabetic neuropathy, unspecified: Secondary | ICD-10-CM | POA: Diagnosis not present

## 2021-04-21 DIAGNOSIS — Z86718 Personal history of other venous thrombosis and embolism: Secondary | ICD-10-CM | POA: Diagnosis not present

## 2021-04-21 DIAGNOSIS — I700 Atherosclerosis of aorta: Secondary | ICD-10-CM | POA: Diagnosis not present

## 2021-04-21 DIAGNOSIS — L659 Nonscarring hair loss, unspecified: Secondary | ICD-10-CM | POA: Diagnosis not present

## 2021-04-21 DIAGNOSIS — G4733 Obstructive sleep apnea (adult) (pediatric): Secondary | ICD-10-CM | POA: Diagnosis not present

## 2021-04-21 DIAGNOSIS — R61 Generalized hyperhidrosis: Secondary | ICD-10-CM | POA: Diagnosis not present

## 2021-04-21 DIAGNOSIS — I10 Essential (primary) hypertension: Secondary | ICD-10-CM | POA: Diagnosis not present

## 2021-04-21 DIAGNOSIS — R269 Unspecified abnormalities of gait and mobility: Secondary | ICD-10-CM | POA: Diagnosis not present

## 2021-04-21 DIAGNOSIS — Z79899 Other long term (current) drug therapy: Secondary | ICD-10-CM | POA: Diagnosis not present

## 2021-04-21 DIAGNOSIS — M79675 Pain in left toe(s): Secondary | ICD-10-CM | POA: Diagnosis not present

## 2021-04-21 DIAGNOSIS — R252 Cramp and spasm: Secondary | ICD-10-CM | POA: Diagnosis not present

## 2021-04-21 DIAGNOSIS — Z7984 Long term (current) use of oral hypoglycemic drugs: Secondary | ICD-10-CM | POA: Diagnosis not present

## 2021-04-21 DIAGNOSIS — R5383 Other fatigue: Secondary | ICD-10-CM | POA: Diagnosis not present

## 2021-04-21 DIAGNOSIS — N1831 Chronic kidney disease, stage 3a: Secondary | ICD-10-CM | POA: Diagnosis not present

## 2021-04-21 DIAGNOSIS — N183 Chronic kidney disease, stage 3 unspecified: Secondary | ICD-10-CM | POA: Diagnosis not present

## 2021-04-28 DIAGNOSIS — M79642 Pain in left hand: Secondary | ICD-10-CM | POA: Diagnosis not present

## 2021-05-01 DIAGNOSIS — M5416 Radiculopathy, lumbar region: Secondary | ICD-10-CM | POA: Diagnosis not present

## 2021-06-03 DIAGNOSIS — G894 Chronic pain syndrome: Secondary | ICD-10-CM | POA: Diagnosis not present

## 2021-06-03 DIAGNOSIS — E1140 Type 2 diabetes mellitus with diabetic neuropathy, unspecified: Secondary | ICD-10-CM | POA: Diagnosis not present

## 2021-06-03 DIAGNOSIS — F411 Generalized anxiety disorder: Secondary | ICD-10-CM | POA: Diagnosis not present

## 2021-06-03 DIAGNOSIS — R5383 Other fatigue: Secondary | ICD-10-CM | POA: Diagnosis not present

## 2021-06-03 DIAGNOSIS — Z86718 Personal history of other venous thrombosis and embolism: Secondary | ICD-10-CM | POA: Diagnosis not present

## 2021-06-03 DIAGNOSIS — L659 Nonscarring hair loss, unspecified: Secondary | ICD-10-CM | POA: Diagnosis not present

## 2021-06-03 DIAGNOSIS — N183 Chronic kidney disease, stage 3 unspecified: Secondary | ICD-10-CM | POA: Diagnosis not present

## 2021-06-03 DIAGNOSIS — R269 Unspecified abnormalities of gait and mobility: Secondary | ICD-10-CM | POA: Diagnosis not present

## 2021-06-03 DIAGNOSIS — I700 Atherosclerosis of aorta: Secondary | ICD-10-CM | POA: Diagnosis not present

## 2021-06-03 DIAGNOSIS — R61 Generalized hyperhidrosis: Secondary | ICD-10-CM | POA: Diagnosis not present

## 2021-06-03 DIAGNOSIS — I10 Essential (primary) hypertension: Secondary | ICD-10-CM | POA: Diagnosis not present

## 2021-06-05 ENCOUNTER — Encounter: Payer: Self-pay | Admitting: Cardiology

## 2021-06-05 ENCOUNTER — Ambulatory Visit (INDEPENDENT_AMBULATORY_CARE_PROVIDER_SITE_OTHER): Payer: HMO | Admitting: Cardiology

## 2021-06-05 ENCOUNTER — Other Ambulatory Visit: Payer: Self-pay

## 2021-06-05 VITALS — BP 124/80 | HR 81 | Ht 65.0 in | Wt 213.0 lb

## 2021-06-05 DIAGNOSIS — R079 Chest pain, unspecified: Secondary | ICD-10-CM

## 2021-06-05 DIAGNOSIS — R0600 Dyspnea, unspecified: Secondary | ICD-10-CM | POA: Diagnosis not present

## 2021-06-05 DIAGNOSIS — I700 Atherosclerosis of aorta: Secondary | ICD-10-CM | POA: Diagnosis not present

## 2021-06-05 DIAGNOSIS — E7800 Pure hypercholesterolemia, unspecified: Secondary | ICD-10-CM

## 2021-06-05 DIAGNOSIS — I10 Essential (primary) hypertension: Secondary | ICD-10-CM | POA: Diagnosis not present

## 2021-06-05 DIAGNOSIS — Z01812 Encounter for preprocedural laboratory examination: Secondary | ICD-10-CM

## 2021-06-05 DIAGNOSIS — Z794 Long term (current) use of insulin: Secondary | ICD-10-CM | POA: Diagnosis not present

## 2021-06-05 DIAGNOSIS — E1165 Type 2 diabetes mellitus with hyperglycemia: Secondary | ICD-10-CM | POA: Diagnosis not present

## 2021-06-05 LAB — CBC
Hematocrit: 36.9 % (ref 34.0–46.6)
Hemoglobin: 12.3 g/dL (ref 11.1–15.9)
MCH: 26.5 pg — ABNORMAL LOW (ref 26.6–33.0)
MCHC: 33.3 g/dL (ref 31.5–35.7)
MCV: 80 fL (ref 79–97)
Platelets: 260 10*3/uL (ref 150–450)
RBC: 4.64 x10E6/uL (ref 3.77–5.28)
RDW: 14.1 % (ref 11.7–15.4)
WBC: 7.7 10*3/uL (ref 3.4–10.8)

## 2021-06-05 LAB — BASIC METABOLIC PANEL
BUN/Creatinine Ratio: 28 (ref 12–28)
BUN: 30 mg/dL — ABNORMAL HIGH (ref 8–27)
CO2: 26 mmol/L (ref 20–29)
Calcium: 9.9 mg/dL (ref 8.7–10.3)
Chloride: 105 mmol/L (ref 96–106)
Creatinine, Ser: 1.09 mg/dL — ABNORMAL HIGH (ref 0.57–1.00)
Glucose: 150 mg/dL — ABNORMAL HIGH (ref 70–99)
Potassium: 5.3 mmol/L — ABNORMAL HIGH (ref 3.5–5.2)
Sodium: 138 mmol/L (ref 134–144)
eGFR: 52 mL/min/{1.73_m2} — ABNORMAL LOW (ref >59)

## 2021-06-05 NOTE — Assessment & Plan Note (Signed)
Rosuvastatin 10 mg.  No changes made.  Diabetic.

## 2021-06-05 NOTE — Patient Instructions (Signed)
Medication Instructions:  The current medical regimen is effective;  continue present plan and medications.  *If you need a refill on your cardiac medications before your next appointment, please call your pharmacy*   Lab Work: Please have blood work today (CBC, BMP)  If you have labs (blood work) drawn today and your tests are completely normal, you will receive your results only by: MyChart Message (if you have [place]) OR A paper copy in the mail If you have any lab test that is abnormal or we need to change your treatment, we will call you to review the results.   Testing/Procedures: Your physician has requested that you have an echocardiogram. Echocardiography is a painless test that uses sound waves to create images of your heart. It provides your doctor with information about the size and shape of your heart and how well your hearts chambers and valves are working. This procedure takes approximately one hour. There are no restrictions for this procedure.    [place] OFFICE [address], [place] [address] Dept: [phone] Loc: [phone]  [place]  06/05/2021  You are scheduled for a Cardiac Catheterization on Wednesday, February 1 with Dr. Larae Grooms.  1. Please arrive at the [facility] (Main Entrance A) at [facility]: [address] [place], [place] [address] at 11:00 AM (two hours before your procedure to ensure your preparation). Free valet parking service is available.   Special note: Every effort is made to have your procedure done on time. Please understand that emergencies sometimes delay scheduled procedures.  2. Diet: Do not eat or drink anything after midnight prior to your procedure except sips of water to take medications.  3. Labs:  (have today)  4. Medication instructions in preparation for your procedure:  Please do not take your  Glipizide, Metformin or Furosemide this morning  On the morning of your procedure, take your Aspirin and any morning medicines NOT listed above.  You may use sips of water.  5. Plan for one night stay--bring personal belongings. 6. Bring a current list of your medications and current insurance cards. 7. You MUST have a responsible person to drive you home. 8. Someone MUST be with you the first 24 hours after you arrive home or your discharge will be delayed. 9. Please wear clothes that are easy to get on and off and wear slip-on shoes.  Thank you for allowing [place] to care for you!   -- [facility]  Follow-Up: At [facility], you and your health needs are our priority.  As part of our continuing mission to provide you with exceptional heart care, we have created designated Provider Care Teams.  These Care Teams include your primary Cardiologist (physician) and Advanced Practice Providers (APPs -  Physician Assistants and Nurse Practitioners) who all work together to provide you with the care you need, when you need it.  We recommend signing up for the patient portal called "MyChart".  Sign up information is provided on this After Visit Summary.  MyChart is used to connect with patients for Virtual Visits (Telemedicine).  Patients are able to view lab/test results, encounter notes, upcoming appointments, etc.  Non-urgent messages can be sent to your provider as well.   To learn more about what you can do with MyChart, go to [email or web address].    Your next appointment:   6 month(s)  The format for your next  appointment:   In Person  Provider:   Melina Copa, PA-C, Ermalinda Barrios, PA-C, Christen Bame, NP, or Richardson Dopp, PA-C     Then, [name], MD will plan to see you again in 1 year(s).    Thank you for choosing [place]!!

## 2021-06-05 NOTE — Assessment & Plan Note (Signed)
Continues to work with primary care team, under the direction of Dr. Inda Merlin.  Medications reviewed.  No changes

## 2021-06-05 NOTE — H&P (View-Only) (Signed)
Cardiology Office Note:    Date:  06/05/2021   ID:  [name], DOB August 03, 1940, MRN [id number]  PCP:  [name], MD  [facility] Cardiologist:  [name], MD  [facility] Electrophysiologist:  None   Referring MD: [name], MD   History of Present Illness:    [name] is a 81 y.o. female here for the follow up of shortness of breath.  Has had dyspnea on exertion as well as evaluation of high heart rate at bedtime.  Does water aerobics for an hour and felt fine.  Frustrating for her, her amount of dyspnea on exertion.  Diabetes history of DVT in 2003 Father had heart attack in his 75s.  In 2012 2014 2017-stress test low risk no ischemia CT scan negative for PE Echo 2014 reassuring with normal EF Cardiopulmonary stress test 2017 reassuring.  She believes that this all started back after her knee surgery over 15 years ago.  Today: Overall, she is feeling terrible. She suffers from chest discomfort, sometimes it feels like "an elephant is on her chest" and sometimes it doesn't.  Generally, she notes that her mobility is decreasing, and she is becoming short of breath more frequently with needing to rest often. Also, she endorses tremors.  Additionally, she reports thinning hair that did not improve after switching to diltiazem. She is back on metoprolol at this time.  She denies any palpitations, lightheadedness, headaches, syncope, orthopnea, PND, or lower extremity edema.  Past Medical History:  Diagnosis Date   Anemia    Arthritis    Battery end of life of spinal cord stimulator 2016   battery replaced.    COPD (chronic obstructive pulmonary disease) (HCC)    'Mild' per pt report   Diabetes mellitus    DVT (deep venous thrombosis) (HCC)    Dyspnea    on exertion   GERD (gastroesophageal reflux disease)    History of kidney stones    History of stomach ulcers    Hypertension    dr Marlou Porch   Neuropathy    Rapid heartbeat    Sleep  apnea    borderline study; could use a CPAP if desired.     Past Surgical History:  Procedure Laterality Date   ABDOMINAL HYSTERECTOMY  1979 & 1981   ovaries and uterus removed at different times.   APPENDECTOMY     ARTHOSCOPIC ROTAOR CUFF REPAIR Left 2010 or 2011   BACK SURGERY  x 2 1999 and 2008   lower back L 4 to L 5 L5 to S 1   BREAST BIOPSY Bilateral 05/18/1997   CARPAL TUNNEL RELEASE Right 2014   CARPAL TUNNEL RELEASE Bilateral    COLONOSCOPY WITH PROPOFOL N/A 10/25/2012   Procedure: COLONOSCOPY WITH PROPOFOL;  Surgeon: [name], MD;  Location: [facility];  Service: Endoscopy;  Laterality: N/A;   EYE SURGERY     eyelid surgery   JOINT REPLACEMENT Right 2012   knee   KNEE ARTHROSCOPY Right 2004   LUMBAR LAMINECTOMY/DECOMPRESSION MICRODISCECTOMY Bilateral 03/03/2018   Procedure: Laminectomy and Foraminotomy - Lumbar one-Lumbar two - bilateral Posterior lateral fusion;  Surgeon: [name], MD;  Location: [facility];  Service: Neurosurgery;  Laterality: Bilateral;   ovarian wedge resection  1966   SPINAL CORD STIMULATOR INSERTION N/A 11/03/2012   Procedure: LUMBAR SPINAL CORD STIMULATOR INSERTION;  Surgeon: [name], MD;  Location: [facility];  Service: Orthopedics;  Laterality: N/A;   SPINAL FUSION  10/2016   TONSILLECTOMY  1960   TOTAL KNEE ARTHROPLASTY Left 06/13/2018   Procedure: TOTAL KNEE ARTHROPLASTY;  Surgeon: Gaynelle Arabian, MD;  Location: [facility];  Service: Orthopedics;  Laterality: Left;  Adductor Block    Current Medications: Current Meds  Medication Sig   albuterol (PROVENTIL HFA;VENTOLIN HFA) 108 (90 Base) MCG/ACT inhaler Inhale 1 puff into the lungs every 6 (six) hours as needed for wheezing or shortness of breath.   aspirin 81 MG chewable tablet Chew 81 mg by mouth daily.   Biotin 5000 MCG TABS Take 5,000 mcg by mouth daily.   calcium carbonate (OSCAL) 1500 (600 Ca) MG TABS tablet Take 1 tablet by mouth daily.   estradiol (ESTRACE) 1 MG tablet Take 1 mg  by mouth every evening.    fexofenadine (ALLEGRA) 180 MG tablet Take 180 mg by mouth daily as needed for allergies or rhinitis.   fluticasone furoate-vilanterol (BREO ELLIPTA) 100-25 MCG/INH AEPB Inhale 2 puffs into the lungs daily.    furosemide (LASIX) 20 MG tablet Take 20 mg by mouth daily as needed for fluid.    glipiZIDE (GLUCOTROL) 10 MG tablet Take 10 mg by mouth daily.   HYDROcodone-acetaminophen (NORCO) 7.5-325 MG tablet Take 1 tablet by mouth every 8 (eight) hours as needed for moderate pain.   Insulin Glargine 300 UNIT/ML SOPN Inject 60 Units into the skin daily.    losartan (COZAAR) 100 MG tablet Take 100 mg by mouth every evening.    meloxicam (MOBIC) 7.5 MG tablet Take 7.5 mg by mouth daily as needed for pain.   metFORMIN (GLUCOPHAGE) 1000 MG tablet Take 1,000 mg by mouth 2 (two) times daily.    metoprolol succinate (TOPROL-XL) 50 MG 24 hr tablet Take 1 tablet (50 mg total) by mouth 2 (two) times daily.   Omega-3 Fatty Acids (FISH OIL) 1200 MG CAPS Take 1,200 mg by mouth 2 (two) times daily.   omeprazole (PRILOSEC) 40 MG capsule Take 40 mg by mouth daily.   potassium chloride (K-DUR,KLOR-CON) 10 MEQ tablet Take 10 mEq by mouth daily.    rosuvastatin (CRESTOR) 10 MG tablet Take 10 mg by mouth every evening.      Allergies:   Hydrochlorothiazide; Ace inhibitors; Ezetimibe-simvastatin; Indomethacin; Lisinopril; Lyrica [pregabalin]; Prednisone; Simvastatin; Vioxx [rofecoxib]; Troglitazone; and Antihistamines, chlorpheniramine-type   Social History   Socioeconomic History   Marital status: Married    Spouse name: Not on file   Number of children: Not on file   Years of education: Not on file   Highest education level: Not on file  Occupational History   Not on file  Tobacco Use   Smoking status: Never   Smokeless tobacco: Never  Vaping Use   Vaping Use: Never used  Substance and Sexual Activity   Alcohol use: No   Drug use: No   Sexual activity: Not on file  Other  Topics Concern   Not on file  Social History Narrative   Not on file   Social Determinants of Health   Financial Resource Strain: Not on file  Food Insecurity: Not on file  Transportation Needs: Not on file  Physical Activity: Not on file  Stress: Not on file  Social Connections: Not on file     Family History: The patient's family history includes Breast cancer (age of onset: 41) in her daughter; Diabetes in her mother; Heart disease in her father.  ROS:   Please see the history of present illness.    (+) Chest discomfort (+) Decreased mobility (+) Shortness of breath (+)  Tremors (+) Thinning hair All other systems reviewed and are negative.  EKGs/Labs/Other Studies Reviewed:    Bilateral LE Dopplers 01/04/2019: FINDINGS: Right Lower Extremity   Resting ABI:  1.1   Resting TBI: 0.7   Segmental Pressures: Normal segmental pressures, no significant (20 mmHg) pressure gradient between adjacent segments. Great toe pressure: 89 mm Hg   Arterial Waveforms: Normal tri-phasic arterial waveforms.   PVRs: Normal PVRs with maintained waveform amplitude, augmentation and quality.   Left Lower Extremity:   Resting ABI: 1.2   Resting TBI: 0.7   Segmental Pressures: Normal segmental pressures, no significant (20 mmHg) pressure gradient between adjacent segments. Great toe pressure: 90 mm Hg   Arterial Waveforms: Normal tri-phasic arterial waveforms.   PVRs: Normal PVRs with maintained waveform amplitude, augmentation and quality.   Other: Symmetric upper extremity pressures.   Ankle Brachial index   > 1.4 Non diagnostic secondary to incompressible vessel calcifications   1.0-1.4       Normal   0.9-0.99     Borderline PAD   0.8-0.89     Mild PAD   0.5-0.79     Moderate PAD   < 0.5          Severe PAD   Toe Brachial Index   Normal     >0.65   Moderate  0.53-0.64   Severe     <0.23   Toe Pressures   Absolute toe pressure >72mmHg sufficient for  wound healing.   Toe pressures <68mmHg = critical limb ischemia.   IMPRESSION: Normal examination. No evidence of hemodynamically significant peripheral arterial disease.  Nuclear Stress 04/01/2016: Nuclear stress EF: 76%. There was no ST segment deviation noted during stress. The study is normal. This is a low risk study. The left ventricular ejection fraction is hyperdynamic (>65%).  Cardiopulmonary Exercise Test 07/11/2015: Interpretation   Notes: Patient gave a very good effort. Pulse-oximetry remained  98% or above throughout exercise. Exercise was performed on a  cycle ergometer starting at [facility] and increasing by 10W/min.   ECG:  Resting ECG in sinus tachycardia with occasional PACs. The  HR response was adequate. There were no sustained arrhythmias and  no ST-T changes throughout the exercise. BP response was  hypertensive.   PFT:  Pre-exercise spirometry suggests possible mild restriction.  MVV normal.   CPX:  The RER of 1.28 indicates a maximal effort. The peak VO2 is  normal at 13.6 ml/kg/min (101% of the age/gender/weight matched  sedentary norm). When adjusted to the patients ideal body weight  of 142.2 lb (64.5 kg) the peak VO2 is 20.4 ml/kg (ibw)/min (118%  of the IBW adjusted predicted). The VO2 at the ventilatory  threshold is normal at 68.7% of the predicted peak VO2. At peak  exercise the ventilation was 57.6% of the measured MVV indicating  ventilatory reserve remained. As mentioned above there was an  adequate HR response to the exercise with a HR reserve of 8 bpm.  The O2pulse (a surrogate for stroke volume) increased with  exercise itensity reaching 9 ml/beat (100% of predicted). The  VE/VCO2 slope is normal. The oxygen uptake efficiency slope  (OUES) is normal and reflects the patient's measured functional  capacity.    Conclusion: Exercise testing with gas exchange demonstrates  normal functional capacity when compared to matched sedentary  norms.  There are no obvious ventilatory or circulatory  limitations to exercise, however there was a hypertensive  response to the exercise. At peak exercise, patient appears  primarily limited by  her body habitus.   EKG:  EKG is personally reviewed and interpreted. 06/05/2021: Sinus rhythm. Rate 77 bpm. Artifact from spinal stimulator. 04/26/2020: sinus bradycardia 58 no other abnormalities  Recent Labs: No results found for requested labs within last 8760 hours.   Recent Lipid Panel No results found for: CHOL, TRIG, HDL, CHOLHDL, VLDL, LDLCALC, LDLDIRECT   Risk Assessment/Calculations:       Physical Exam:    VS:  BP 124/80 (BP Location: Left Arm, Patient Position: Sitting, Cuff Size: Normal)    Pulse 81    Ht 5\' 5"  (1.651 m)    Wt 213 lb (96.6 kg)    LMP  (LMP Unknown)    SpO2 93%    BMI 35.45 kg/m     Wt Readings from Last 3 Encounters:  06/05/21 213 lb (96.6 kg)  04/26/20 201 lb (91.2 kg)  04/19/19 210 lb (95.3 kg)     GEN:  Well nourished, well developed in no acute distress HEENT: Normal NECK: No JVD; No carotid bruits LYMPHATICS: No lymphadenopathy CARDIAC: RRR, 1/6 soft systolic murmur, no rubs, no gallops RESPIRATORY:  Clear to auscultation without rales, wheezing or rhonchi  ABDOMEN: Soft, non-tender, non-distended MUSCULOSKELETAL:  No edema; No deformity  SKIN: Warm and dry NEUROLOGIC:  Alert and oriented x 3 PSYCHIATRIC:  Normal affect   ASSESSMENT:    1. Pre-procedure lab exam   2. Aortic atherosclerosis ([place])   3. Essential hypertension, benign   4. Type 2 diabetes mellitus with hyperglycemia, with long-term current use of insulin (HCC)   5. Dyspnea, unspecified type   6. Pure hypercholesterolemia   7. Chest pain of uncertain etiology     PLAN:    In order of problems listed above:  Aortic atherosclerosis ([place]) Previously seen on radiologic studies.  Personally reviewed.  Continue with statin, good blood pressure control.  Also on aspirin 81  mg.  Essential hypertension, benign Overall reasonable control on losartan 100 mg, Toprol-XL 50 mg twice a day  Type 2 diabetes mellitus with hyperglycemia ([place]) Continues to work with primary care team, under the direction of Dr. Inda Merlin.  Medications reviewed.  No changes  Dyspnea Previously extensive work-up including CPX testing stress testing.  Continue with exercise.  This has been frustrating for her.  Had prior Achilles tendon tear.  Deconditioning.  We will repeat an echocardiogram since has been several years.  Ensure proper structure and function.  We are also checking a heart catheterization at this point.  Pure hypercholesterolemia Rosuvastatin 10 mg.  No changes made.  Diabetic.  Chest pain of uncertain etiology Longstanding history of shortness of breath which seems to be worsening, decreased stamina, increasing chest pain pressure-like sensation feels like someone sitting on her chest with exertion.  Because of her ongoing symptoms as well as comorbidities including diabetes we will go ahead and proceed with right and left heart catheterization.  Risk and benefits of been explained including stroke heart attack death renal impairment death.  She is willing to proceed.  We will also check an echocardiogram to ensure proper structure and function of her heart.  Follow-up: 6 months with APP.      Medication Adjustments/Labs and Tests Ordered: Current medicines are reviewed at length with the patient today.  Concerns regarding medicines are outlined above.   Orders Placed This Encounter  Procedures   Basic metabolic panel   CBC   EKG 12-Lead   ECHOCARDIOGRAM COMPLETE   No orders of the defined types were placed in  this encounter.  Patient Instructions  Medication Instructions:  The current medical regimen is effective;  continue present plan and medications.  *If you need a refill on your cardiac medications before your next appointment, please call your  pharmacy*   Lab Work: Please have blood work today (CBC, BMP)  If you have labs (blood work) drawn today and your tests are completely normal, you will receive your results only by: MyChart Message (if you have [place]) OR A paper copy in the mail If you have any lab test that is abnormal or we need to change your treatment, we will call you to review the results.   Testing/Procedures: Your physician has requested that you have an echocardiogram. Echocardiography is a painless test that uses sound waves to create images of your heart. It provides your doctor with information about the size and shape of your heart and how well your hearts chambers and valves are working. This procedure takes approximately one hour. There are no restrictions for this procedure.    [facility] OFFICE [address], [place] [address] Dept: [phone] Loc: [phone]  [name]  06/05/2021  You are scheduled for a Cardiac Catheterization on Wednesday, February 1 with Dr. Larae Grooms.  1. Please arrive at the [facility] (Main Entrance A) at [facility]: [address] [place], [place] [address] at 11:00 AM (two hours before your procedure to ensure your preparation). Free valet parking service is available.   Special note: Every effort is made to have your procedure done on time. Please understand that emergencies sometimes delay scheduled procedures.  2. Diet: Do not eat or drink anything after midnight prior to your procedure except sips of water to take medications.  3. Labs:  (have today)  4. Medication instructions in preparation for your procedure:  Please do not take your Glipizide, Metformin or Furosemide this morning  On the morning of your procedure, take your Aspirin and any morning medicines NOT listed above.  You may use sips of water.  5. Plan for one night  stay--bring personal belongings. 6. Bring a current list of your medications and current insurance cards. 7. You MUST have a responsible person to drive you home. 8. Someone MUST be with you the first 24 hours after you arrive home or your discharge will be delayed. 9. Please wear clothes that are easy to get on and off and wear slip-on shoes.  Thank you for allowing [place] to care for you!   -- [facility]  Follow-Up: At [facility] you and your health needs are our priority.  As part of our continuing mission to provide you with exceptional heart care, we have created designated Provider Care Teams.  These Care Teams include your primary Cardiologist (physician) and Advanced Practice Providers (APPs -  Physician Assistants and Nurse Practitioners) who all work together to provide you with the care you need, when you need it.  We recommend signing up for the patient portal called "MyChart".  Sign up information is provided on this After Visit Summary.  MyChart is used to connect with patients for Virtual Visits (Telemedicine).  Patients are able to view lab/test results, encounter notes, upcoming appointments, etc.  Non-urgent messages can be sent to your provider as well.   To learn more about what you can do with MyChart, go to [email or web address].    Your next appointment:   6  month(s)  The format for your next appointment:   In Person  Provider:   Melina Copa, PA-C, Ermalinda Barrios, PA-C, Christen Bame, NP, or Richardson Dopp, PA-C     Then, [name], MD will plan to see you again in 1 year(s).    Thank you for choosing [facility]!!      I,Mathew Stumpf,acting as a scribe for [name], MD.,have documented all relevant documentation on the behalf of [name], MD,as directed by  [name], MD while in the presence of [name], MD.  I, [name], MD, have reviewed all documentation for this visit. The documentation on  06/05/21 for the exam, diagnosis, procedures, and orders are all accurate and complete.   Signed, [name], MD  06/05/2021 10:35 AM    [place]

## 2021-06-05 NOTE — Assessment & Plan Note (Signed)
Overall reasonable control on losartan 100 mg, Toprol-XL 50 mg twice a day

## 2021-06-05 NOTE — Assessment & Plan Note (Addendum)
Previously extensive work-up including CPX testing stress testing.  Continue with exercise.  This has been frustrating for her.  Had prior Achilles tendon tear.  Deconditioning.  We will repeat an echocardiogram since has been several years.  Ensure proper structure and function.  We are also checking a heart catheterization at this point.

## 2021-06-05 NOTE — Assessment & Plan Note (Signed)
Longstanding history of shortness of breath which seems to be worsening, decreased stamina, increasing chest pain pressure-like sensation feels like someone sitting on her chest with exertion.  Because of her ongoing symptoms as well as comorbidities including diabetes we will go ahead and proceed with right and left heart catheterization.  Risk and benefits of been explained including stroke heart attack death renal impairment death.  She is willing to proceed.  We will also check an echocardiogram to ensure proper structure and function of her heart.

## 2021-06-05 NOTE — Assessment & Plan Note (Signed)
Previously seen on radiologic studies.  Personally reviewed.  Continue with statin, good blood pressure control.  Also on aspirin 81 mg.

## 2021-06-05 NOTE — Progress Notes (Addendum)
Cardiology Office Note:    Date:  06/05/2021   ID:  [name], DOB [birth year], MRN [id number]  PCP:  [name], MD  [facility] Cardiologist:  [name], MD  [facility] Electrophysiologist:  None   Referring MD: [name], MD   History of Present Illness:    [name] is a 81 y.o. female here for the follow up of shortness of breath.  Has had dyspnea on exertion as well as evaluation of high heart rate at bedtime.  Does water aerobics for an hour and felt fine.  Frustrating for her, her amount of dyspnea on exertion.  Diabetes history of DVT in 2003 Father had heart attack in his 55s.  In 2012 2014 2017-stress test low risk no ischemia CT scan negative for PE Echo 2014 reassuring with normal EF Cardiopulmonary stress test 2017 reassuring.  She believes that this all started back after her knee surgery over 15 years ago.  Today: Overall, she is feeling terrible. She suffers from chest discomfort, sometimes it feels like "an elephant is on her chest" and sometimes it doesn't.  Generally, she notes that her mobility is decreasing, and she is becoming short of breath more frequently with needing to rest often. Also, she endorses tremors.  Additionally, she reports thinning hair that did not improve after switching to diltiazem. She is back on metoprolol at this time.  She denies any palpitations, lightheadedness, headaches, syncope, orthopnea, PND, or lower extremity edema.  Past Medical History:  Diagnosis Date   Anemia    Arthritis    Battery end of life of spinal cord stimulator 2016   battery replaced.    COPD (chronic obstructive pulmonary disease) (HCC)    'Mild' per pt report   Diabetes mellitus    DVT (deep venous thrombosis) (HCC)    Dyspnea    on exertion   GERD (gastroesophageal reflux disease)    History of kidney stones    History of stomach ulcers    Hypertension    dr Marlou Porch   Neuropathy    Rapid heartbeat    Sleep  apnea    borderline study; could use a CPAP if desired.     Past Surgical History:  Procedure Laterality Date   ABDOMINAL HYSTERECTOMY  1979 & 1981   ovaries and uterus removed at different times.   APPENDECTOMY     ARTHOSCOPIC ROTAOR CUFF REPAIR Left 2010 or 2011   BACK SURGERY  x 2 1999 and 2008   lower back L 4 to L 5 L5 to S 1   BREAST BIOPSY Bilateral 05/18/1997   CARPAL TUNNEL RELEASE Right 2014   CARPAL TUNNEL RELEASE Bilateral    COLONOSCOPY WITH PROPOFOL N/A 10/25/2012   Procedure: COLONOSCOPY WITH PROPOFOL;  Surgeon: [name], MD;  Location: [facility];  Service: Endoscopy;  Laterality: N/A;   EYE SURGERY     eyelid surgery   JOINT REPLACEMENT Right 2012   knee   KNEE ARTHROSCOPY Right 2004   LUMBAR LAMINECTOMY/DECOMPRESSION MICRODISCECTOMY Bilateral 03/03/2018   Procedure: Laminectomy and Foraminotomy - Lumbar one-Lumbar two - bilateral Posterior lateral fusion;  Surgeon: [name], MD;  Location: [place];  Service: Neurosurgery;  Laterality: Bilateral;   ovarian wedge resection  1966   SPINAL CORD STIMULATOR INSERTION N/A 11/03/2012   Procedure: LUMBAR SPINAL CORD STIMULATOR INSERTION;  Surgeon: [name], MD;  Location: [place];  Service: Orthopedics;  Laterality: N/A;   SPINAL FUSION  10/2016   TONSILLECTOMY  1960   TOTAL KNEE ARTHROPLASTY Left 06/13/2018   Procedure: TOTAL KNEE ARTHROPLASTY;  Surgeon: Gaynelle Arabian, MD;  Location: [facility];  Service: Orthopedics;  Laterality: Left;  Adductor Block    Current Medications: Current Meds  Medication Sig   albuterol (PROVENTIL HFA;VENTOLIN HFA) 108 (90 Base) MCG/ACT inhaler Inhale 1 puff into the lungs every 6 (six) hours as needed for wheezing or shortness of breath.   aspirin 81 MG chewable tablet Chew 81 mg by mouth daily.   Biotin 5000 MCG TABS Take 5,000 mcg by mouth daily.   calcium carbonate (OSCAL) 1500 (600 Ca) MG TABS tablet Take 1 tablet by mouth daily.   estradiol (ESTRACE) 1 MG tablet Take 1 mg  by mouth every evening.    fexofenadine (ALLEGRA) 180 MG tablet Take 180 mg by mouth daily as needed for allergies or rhinitis.   fluticasone furoate-vilanterol (BREO ELLIPTA) 100-25 MCG/INH AEPB Inhale 2 puffs into the lungs daily.    furosemide (LASIX) 20 MG tablet Take 20 mg by mouth daily as needed for fluid.    glipiZIDE (GLUCOTROL) 10 MG tablet Take 10 mg by mouth daily.   HYDROcodone-acetaminophen (NORCO) 7.5-325 MG tablet Take 1 tablet by mouth every 8 (eight) hours as needed for moderate pain.   Insulin Glargine 300 UNIT/ML SOPN Inject 60 Units into the skin daily.    losartan (COZAAR) 100 MG tablet Take 100 mg by mouth every evening.    meloxicam (MOBIC) 7.5 MG tablet Take 7.5 mg by mouth daily as needed for pain.   metFORMIN (GLUCOPHAGE) 1000 MG tablet Take 1,000 mg by mouth 2 (two) times daily.    metoprolol succinate (TOPROL-XL) 50 MG 24 hr tablet Take 1 tablet (50 mg total) by mouth 2 (two) times daily.   Omega-3 Fatty Acids (FISH OIL) 1200 MG CAPS Take 1,200 mg by mouth 2 (two) times daily.   omeprazole (PRILOSEC) 40 MG capsule Take 40 mg by mouth daily.   potassium chloride (K-DUR,KLOR-CON) 10 MEQ tablet Take 10 mEq by mouth daily.    rosuvastatin (CRESTOR) 10 MG tablet Take 10 mg by mouth every evening.      Allergies:   Hydrochlorothiazide; Ace inhibitors; Ezetimibe-simvastatin; Indomethacin; Lisinopril; Lyrica [pregabalin]; Prednisone; Simvastatin; Vioxx [rofecoxib]; Troglitazone; and Antihistamines, chlorpheniramine-type   Social History   Socioeconomic History   Marital status: Married    Spouse name: Not on file   Number of children: Not on file   Years of education: Not on file   Highest education level: Not on file  Occupational History   Not on file  Tobacco Use   Smoking status: Never   Smokeless tobacco: Never  Vaping Use   Vaping Use: Never used  Substance and Sexual Activity   Alcohol use: No   Drug use: No   Sexual activity: Not on file  Other  Topics Concern   Not on file  Social History Narrative   Not on file   Social Determinants of Health   Financial Resource Strain: Not on file  Food Insecurity: Not on file  Transportation Needs: Not on file  Physical Activity: Not on file  Stress: Not on file  Social Connections: Not on file     Family History: The patient's family history includes Breast cancer (age of onset: 60) in her daughter; Diabetes in her mother; Heart disease in her father.  ROS:   Please see the history of present illness.    (+) Chest discomfort (+) Decreased mobility (+) Shortness of breath (+)  Tremors (+) Thinning hair All other systems reviewed and are negative.  EKGs/Labs/Other Studies Reviewed:    Bilateral LE Dopplers 01/04/2019: FINDINGS: Right Lower Extremity   Resting ABI:  1.1   Resting TBI: 0.7   Segmental Pressures: Normal segmental pressures, no significant (20 mmHg) pressure gradient between adjacent segments. Great toe pressure: 89 mm Hg   Arterial Waveforms: Normal tri-phasic arterial waveforms.   PVRs: Normal PVRs with maintained waveform amplitude, augmentation and quality.   Left Lower Extremity:   Resting ABI: 1.2   Resting TBI: 0.7   Segmental Pressures: Normal segmental pressures, no significant (20 mmHg) pressure gradient between adjacent segments. Great toe pressure: 90 mm Hg   Arterial Waveforms: Normal tri-phasic arterial waveforms.   PVRs: Normal PVRs with maintained waveform amplitude, augmentation and quality.   Other: Symmetric upper extremity pressures.   Ankle Brachial index   > 1.4 Non diagnostic secondary to incompressible vessel calcifications   1.0-1.4       Normal   0.9-0.99     Borderline PAD   0.8-0.89     Mild PAD   0.5-0.79     Moderate PAD   < 0.5          Severe PAD   Toe Brachial Index   Normal     >0.65   Moderate  0.53-0.64   Severe     <0.23   Toe Pressures   Absolute toe pressure >35mmHg sufficient for  wound healing.   Toe pressures <65mmHg = critical limb ischemia.   IMPRESSION: Normal examination. No evidence of hemodynamically significant peripheral arterial disease.  Nuclear Stress 04/01/2016: Nuclear stress EF: 76%. There was no ST segment deviation noted during stress. The study is normal. This is a low risk study. The left ventricular ejection fraction is hyperdynamic (>65%).  Cardiopulmonary Exercise Test 07/11/2015: Interpretation   Notes: Patient gave a very good effort. Pulse-oximetry remained  98% or above throughout exercise. Exercise was performed on a  cycle ergometer starting at [facility] and increasing by 10W/min.   ECG:  Resting ECG in sinus tachycardia with occasional PACs. The  HR response was adequate. There were no sustained arrhythmias and  no ST-T changes throughout the exercise. BP response was  hypertensive.   PFT:  Pre-exercise spirometry suggests possible mild restriction.  MVV normal.   CPX:  The RER of 1.28 indicates a maximal effort. The peak VO2 is  normal at 13.6 ml/kg/min (101% of the age/gender/weight matched  sedentary norm). When adjusted to the patients ideal body weight  of 142.2 lb (64.5 kg) the peak VO2 is 20.4 ml/kg (ibw)/min (118%  of the IBW adjusted predicted). The VO2 at the ventilatory  threshold is normal at 68.7% of the predicted peak VO2. At peak  exercise the ventilation was 57.6% of the measured MVV indicating  ventilatory reserve remained. As mentioned above there was an  adequate HR response to the exercise with a HR reserve of 8 bpm.  The O2pulse (a surrogate for stroke volume) increased with  exercise itensity reaching 9 ml/beat (100% of predicted). The  VE/VCO2 slope is normal. The oxygen uptake efficiency slope  (OUES) is normal and reflects the patient's measured functional  capacity.    Conclusion: Exercise testing with gas exchange demonstrates  normal functional capacity when compared to matched sedentary  norms.  There are no obvious ventilatory or circulatory  limitations to exercise, however there was a hypertensive  response to the exercise. At peak exercise, patient appears  primarily limited by  her body habitus.   EKG:  EKG is personally reviewed and interpreted. 06/05/2021: Sinus rhythm. Rate 77 bpm. Artifact from spinal stimulator. 04/26/2020: sinus bradycardia 58 no other abnormalities  Recent Labs: No results found for requested labs within last 8760 hours.   Recent Lipid Panel No results found for: CHOL, TRIG, HDL, CHOLHDL, VLDL, LDLCALC, LDLDIRECT   Risk Assessment/Calculations:       Physical Exam:    VS:  BP 124/80 (BP Location: Left Arm, Patient Position: Sitting, Cuff Size: Normal)    Pulse 81    Ht 5\' 5"  (1.651 m)    Wt 213 lb (96.6 kg)    LMP  (LMP Unknown)    SpO2 93%    BMI 35.45 kg/m     Wt Readings from Last 3 Encounters:  06/05/21 213 lb (96.6 kg)  04/26/20 201 lb (91.2 kg)  04/19/19 210 lb (95.3 kg)     GEN:  Well nourished, well developed in no acute distress HEENT: Normal NECK: No JVD; No carotid bruits LYMPHATICS: No lymphadenopathy CARDIAC: RRR, 1/6 soft systolic murmur, no rubs, no gallops RESPIRATORY:  Clear to auscultation without rales, wheezing or rhonchi  ABDOMEN: Soft, non-tender, non-distended MUSCULOSKELETAL:  No edema; No deformity  SKIN: Warm and dry NEUROLOGIC:  Alert and oriented x 3 PSYCHIATRIC:  Normal affect   ASSESSMENT:    1. Pre-procedure lab exam   2. Aortic atherosclerosis ([place])   3. Essential hypertension, benign   4. Type 2 diabetes mellitus with hyperglycemia, with long-term current use of insulin (HCC)   5. Dyspnea, unspecified type   6. Pure hypercholesterolemia   7. Chest pain of uncertain etiology     PLAN:    In order of problems listed above:  Aortic atherosclerosis ([place]) Previously seen on radiologic studies.  Personally reviewed.  Continue with statin, good blood pressure control.  Also on aspirin 81  mg.  Essential hypertension, benign Overall reasonable control on losartan 100 mg, Toprol-XL 50 mg twice a day  Type 2 diabetes mellitus with hyperglycemia ([place]) Continues to work with primary care team, under the direction of Dr. Inda Merlin.  Medications reviewed.  No changes  Dyspnea Previously extensive work-up including CPX testing stress testing.  Continue with exercise.  This has been frustrating for her.  Had prior Achilles tendon tear.  Deconditioning.  We will repeat an echocardiogram since has been several years.  Ensure proper structure and function.  We are also checking a heart catheterization at this point.  Pure hypercholesterolemia Rosuvastatin 10 mg.  No changes made.  Diabetic.  Chest pain of uncertain etiology Longstanding history of shortness of breath which seems to be worsening, decreased stamina, increasing chest pain pressure-like sensation feels like someone sitting on her chest with exertion.  Because of her ongoing symptoms as well as comorbidities including diabetes we will go ahead and proceed with right and left heart catheterization.  Risk and benefits of been explained including stroke heart attack death renal impairment death.  She is willing to proceed.  We will also check an echocardiogram to ensure proper structure and function of her heart.  Follow-up: 6 months with APP.      Medication Adjustments/Labs and Tests Ordered: Current medicines are reviewed at length with the patient today.  Concerns regarding medicines are outlined above.   Orders Placed This Encounter  Procedures   Basic metabolic panel   CBC   EKG 12-Lead   ECHOCARDIOGRAM COMPLETE   No orders of the defined types were placed in  this encounter.  Patient Instructions  Medication Instructions:  The current medical regimen is effective;  continue present plan and medications.  *If you need a refill on your cardiac medications before your next appointment, please call your  pharmacy*   Lab Work: Please have blood work today (CBC, BMP)  If you have labs (blood work) drawn today and your tests are completely normal, you will receive your results only by: MyChart Message (if you have [place]) OR A paper copy in the mail If you have any lab test that is abnormal or we need to change your treatment, we will call you to review the results.   Testing/Procedures: Your physician has requested that you have an echocardiogram. Echocardiography is a painless test that uses sound waves to create images of your heart. It provides your doctor with information about the size and shape of your heart and how well your hearts chambers and valves are working. This procedure takes approximately one hour. There are no restrictions for this procedure.    [place] OFFICE [place], [place] [address] Dept: [phone] Loc: [phone]  [name]  06/05/2021  You are scheduled for a Cardiac Catheterization on Wednesday, February 1 with Dr. Larae Grooms.  1. Please arrive at the [facility] (Main Entrance A) at [facility]: [address] [place],  [address] at 11:00 AM (two hours before your procedure to ensure your preparation). Free valet parking service is available.   Special note: Every effort is made to have your procedure done on time. Please understand that emergencies sometimes delay scheduled procedures.  2. Diet: Do not eat or drink anything after midnight prior to your procedure except sips of water to take medications.  3. Labs:  (have today)  4. Medication instructions in preparation for your procedure:  Please do not take your Glipizide, Metformin or Furosemide this morning  On the morning of your procedure, take your Aspirin and any morning medicines NOT listed above.  You may use sips of water.  5. Plan for one night  stay--bring personal belongings. 6. Bring a current list of your medications and current insurance cards. 7. You MUST have a responsible person to drive you home. 8. Someone MUST be with you the first 24 hours after you arrive home or your discharge will be delayed. 9. Please wear clothes that are easy to get on and off and wear slip-on shoes.  Thank you for allowing [place] to care for you!   -- [place] [facility]  Follow-Up: At [facility], you and your health needs are our priority.  As part of our continuing mission to provide you with exceptional heart care, we have created designated Provider Care Teams.  These Care Teams include your primary Cardiologist (physician) and Advanced Practice Providers (APPs -  Physician Assistants and Nurse Practitioners) who all work together to provide you with the care you need, when you need it.  We recommend signing up for the patient portal called "MyChart".  Sign up information is provided on this After Visit Summary.  MyChart is used to connect with patients for Virtual Visits (Telemedicine).  Patients are able to view lab/test results, encounter notes, upcoming appointments, etc.  Non-urgent messages can be sent to your provider as well.   To learn more about what you can do with MyChart, go to [email or web address].    Your next appointment:   6  month(s)  The format for your next appointment:   In Person  Provider:   Melina Copa, PA-C, Ermalinda Barrios, PA-C, Christen Bame, NP, or Richardson Dopp, PA-C     Then, [name], MD will plan to see you again in 1 year(s).    Thank you for choosing [facility]!!      I,Mathew Stumpf,acting as a scribe for [name], MD.,have documented all relevant documentation on the behalf of [name], MD,as directed by  [name], MD while in the presence of [name], MD.  I, [name], MD, have reviewed all documentation for this visit. The documentation on  06/05/21 for the exam, diagnosis, procedures, and orders are all accurate and complete.   Signed, [name], MD  06/05/2021 10:35 AM    [place]

## 2021-06-06 ENCOUNTER — Ambulatory Visit (HOSPITAL_COMMUNITY): Payer: HMO | Attending: Cardiovascular Disease

## 2021-06-06 ENCOUNTER — Other Ambulatory Visit: Payer: Self-pay | Admitting: *Deleted

## 2021-06-06 DIAGNOSIS — R079 Chest pain, unspecified: Secondary | ICD-10-CM

## 2021-06-06 DIAGNOSIS — R0609 Other forms of dyspnea: Secondary | ICD-10-CM | POA: Diagnosis not present

## 2021-06-06 DIAGNOSIS — R0600 Dyspnea, unspecified: Secondary | ICD-10-CM | POA: Insufficient documentation

## 2021-06-06 LAB — ECHOCARDIOGRAM COMPLETE
Area-P 1/2: 2.12 cm2
S' Lateral: 2.5 cm

## 2021-06-09 IMAGING — MG DIGITAL SCREENING BILAT W/ TOMO W/ CAD
8 series · 8 of 24 positions shown · non-contrast
Comparison: Previous exam(s).

CLINICAL DATA: Screening.

EXAM:
DIGITAL SCREENING BILATERAL MAMMOGRAM WITH TOMO AND CAD

[R MLO synth-2D]
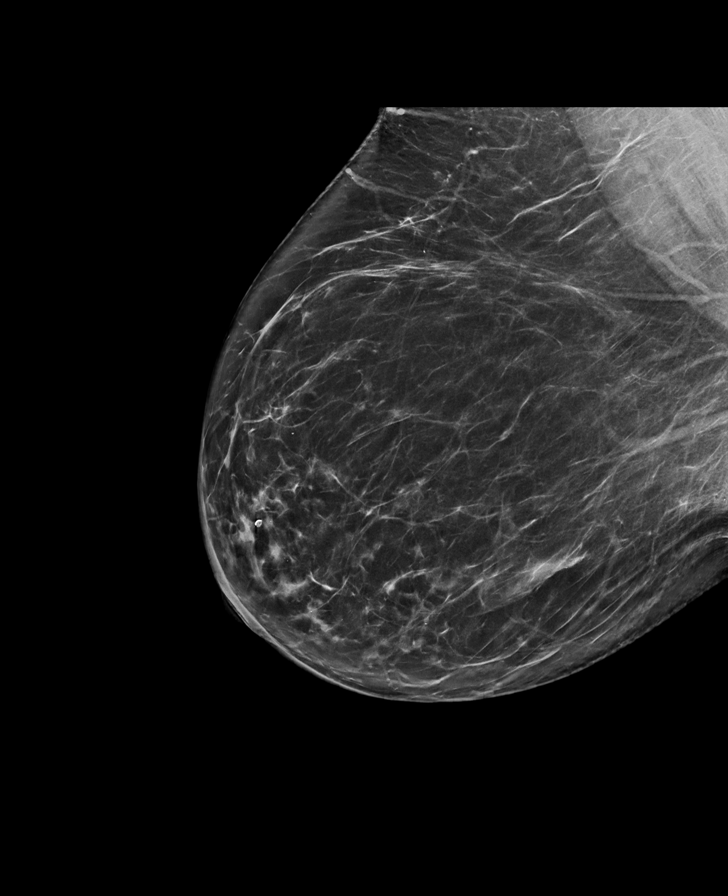

[R CC synth-2D]
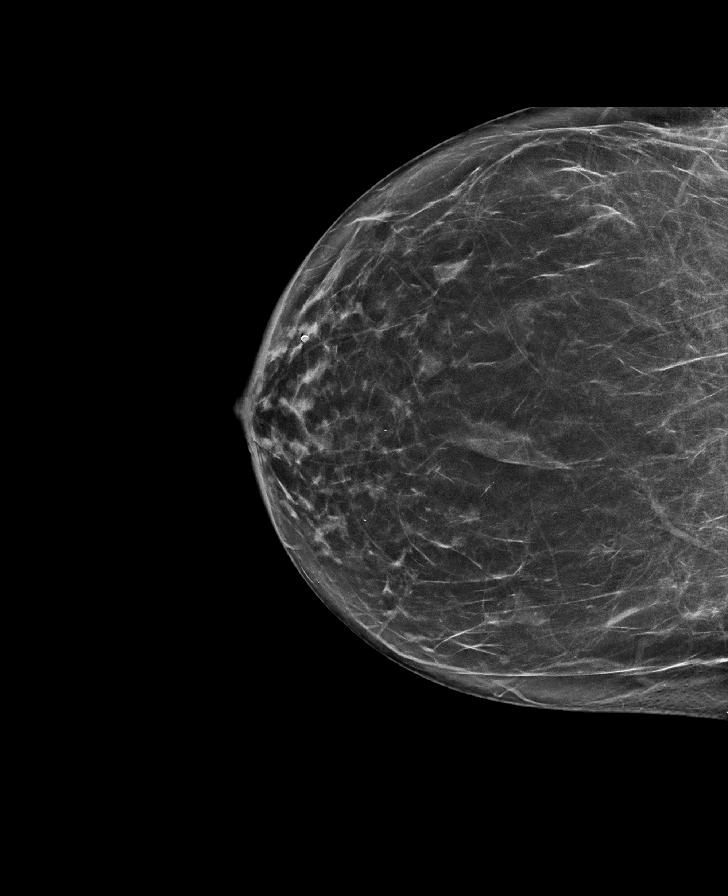

[L CC synth-2D]
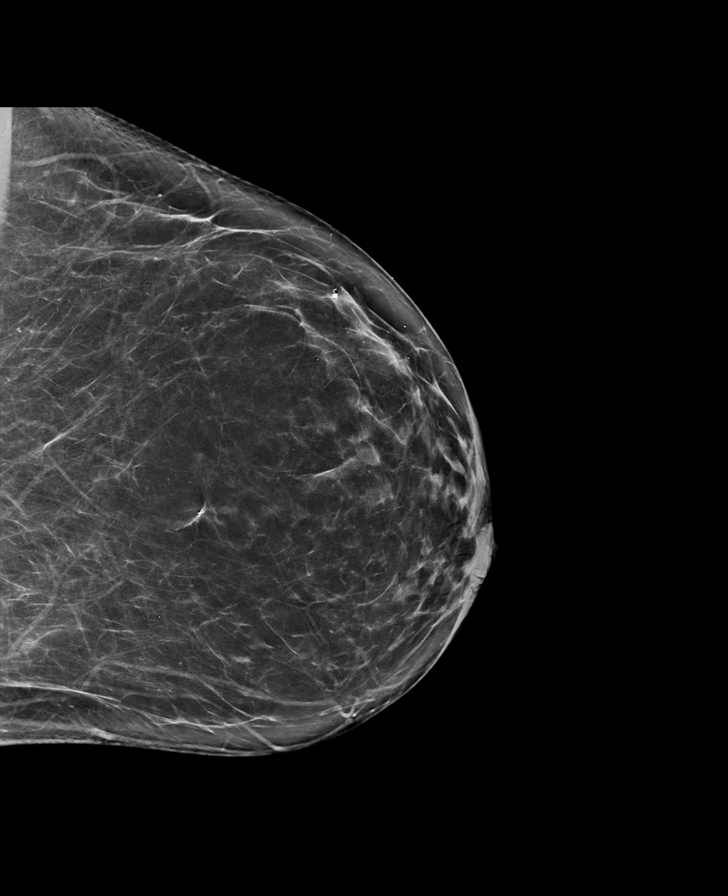

[L MLO synth-2D]
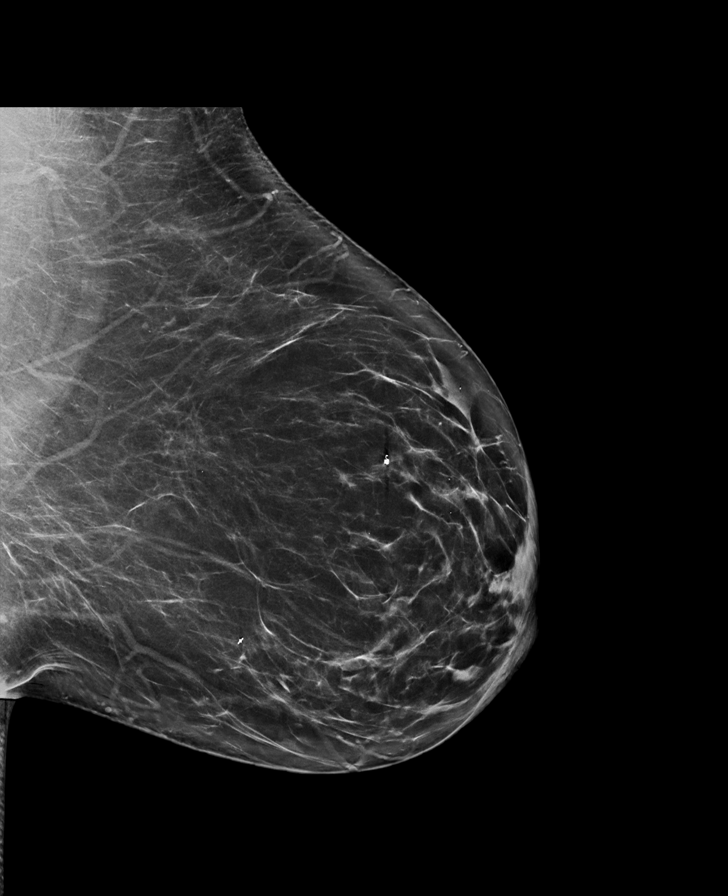

[R MLO tomo · tomo slice 41/82.0]
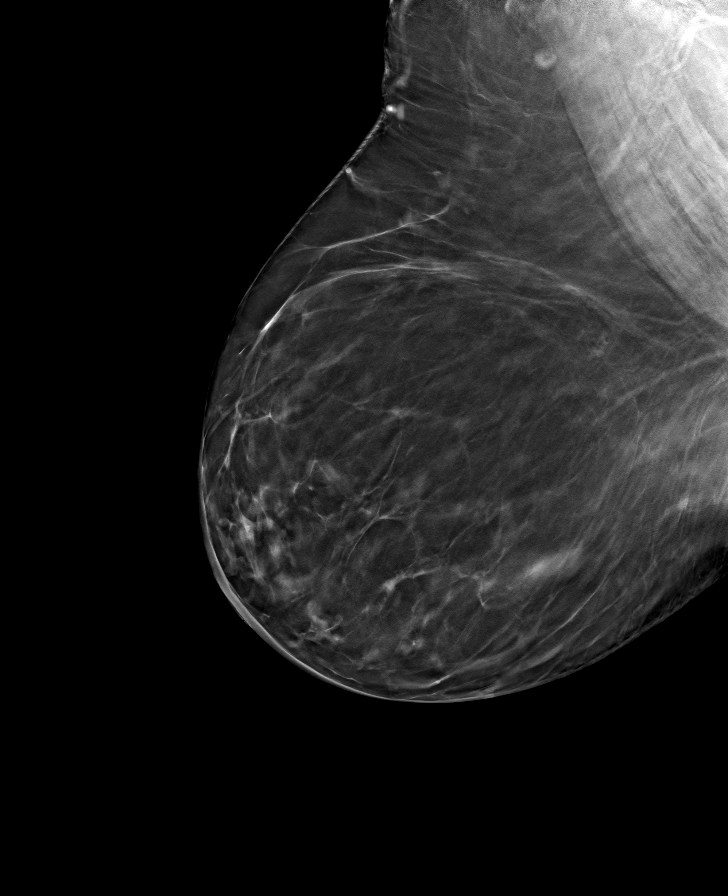

[R CC tomo · tomo slice 35/70.0]
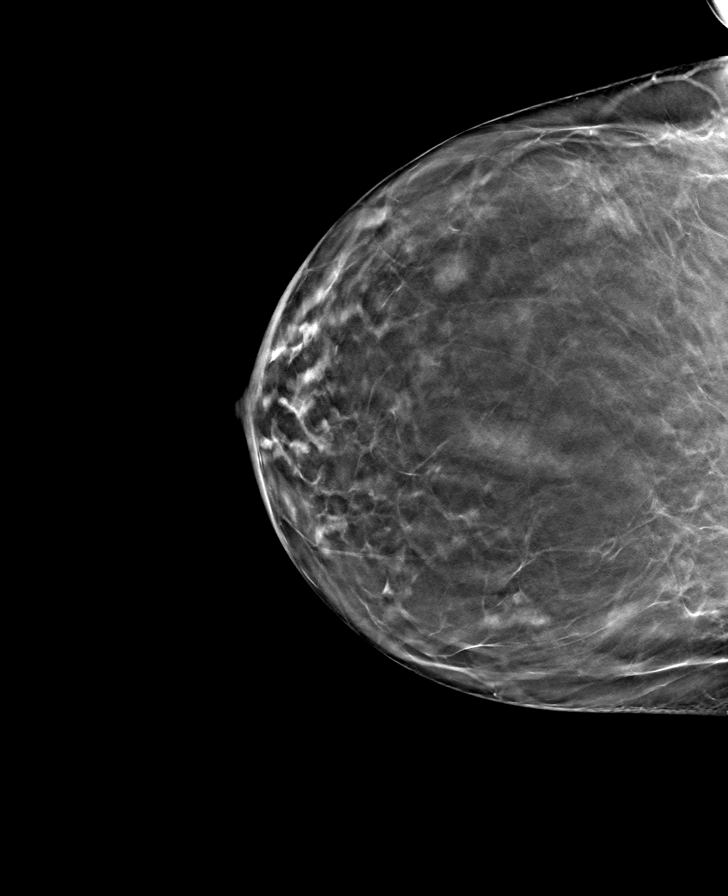

[L CC tomo · tomo slice 37/74.0]
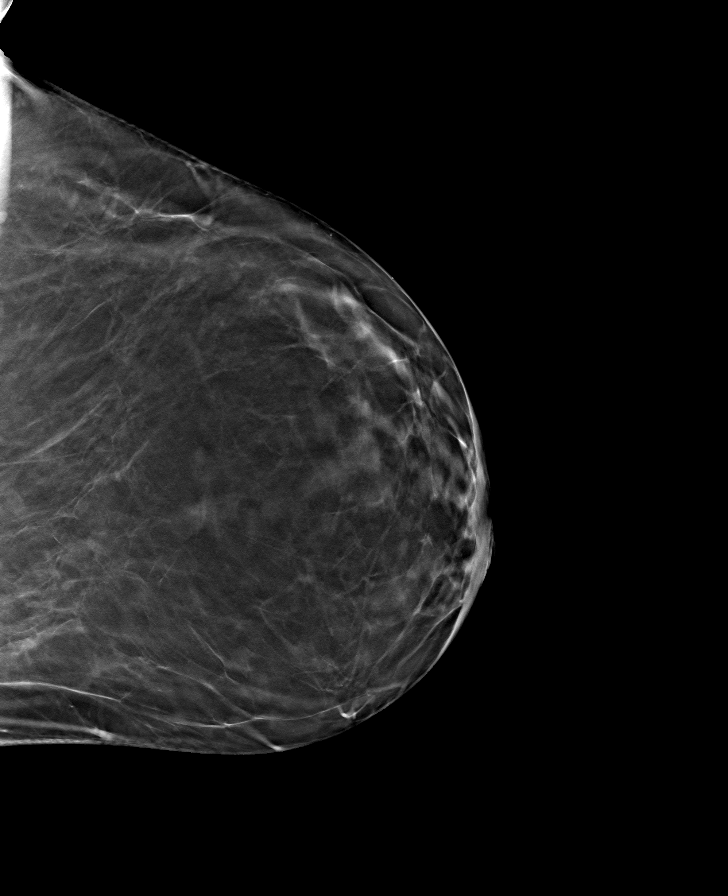

[L MLO tomo · tomo slice 43/85.0]
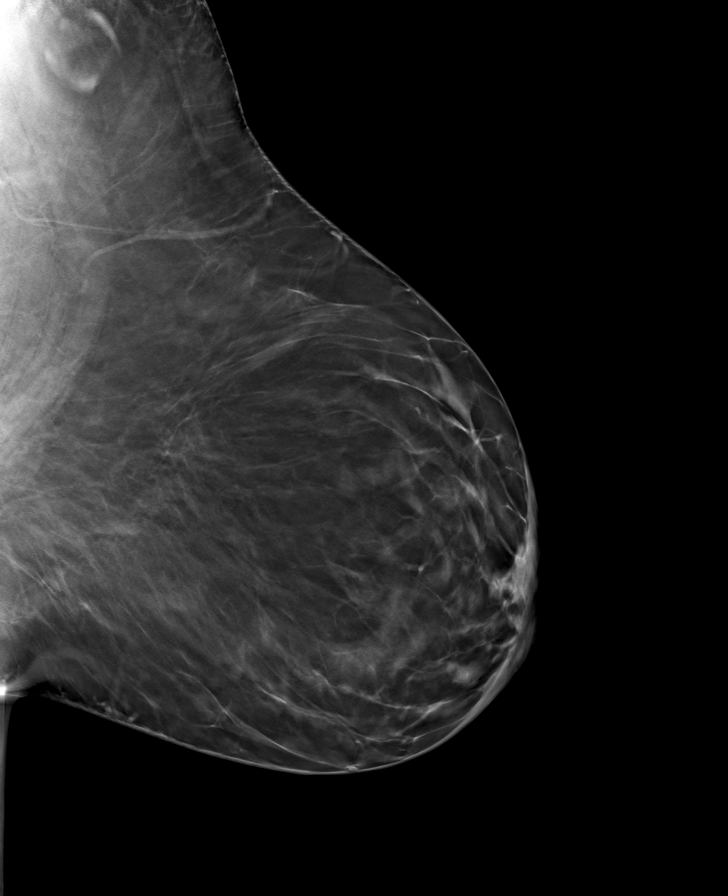

[8 of 24 positions shown; findings below may reference images not displayed]

ACR Breast Density Category b: There are scattered areas of
fibroglandular density.
FINDINGS: In the right breast, a possible mass warrants further evaluation. In
the left breast, no findings suspicious for malignancy. Images were
processed with CAD.
IMPRESSION: Further evaluation is suggested for possible mass in the right
breast.

RECOMMENDATION:
Diagnostic mammogram and possibly ultrasound of the right breast.
(Code:T1-A-550)

The patient will be contacted regarding the findings, and additional
imaging will be scheduled.

BI-RADS CATEGORY  0: Incomplete. Need additional imaging evaluation
and/or prior mammograms for comparison.

## 2021-06-10 ENCOUNTER — Telehealth: Payer: Self-pay | Admitting: *Deleted

## 2021-06-10 NOTE — Telephone Encounter (Signed)
Cardiac catheterization scheduled at [facility] for: Wednesday June 11, 2021 1 PM [facility] Main Entrance A [facility]) at: 11 AM   Diet-no solid food after midnight prior to cath, clear liquids until 5 AM day of procedure.  Medication instructions for procedure: -Hold:  Insulin-AM of procedure  Glipizide-AM of procedure  Metformin-day of procedure and 48 hours post procedure -Except hold medications usual morning medications can be taken pre-cath with sips of water including aspirin 81 mg.    Confirmed patient has responsible adult to drive home post procedure and be with patient first 24 hours after arriving home.  [facility] does allow one visitor to accompany you and wait in the hospital waiting room while you are there for your procedure. You and your visitor will be asked to wear a mask once you enter the hospital.   Patient reports does not currently have any new symptoms concerning for COVID-19 and no household members with COVID-19 like illness.    Reviewed procedure/mask/visitor instructions with patient.

## 2021-06-11 ENCOUNTER — Ambulatory Visit (HOSPITAL_COMMUNITY)
Admission: RE | Admit: 2021-06-11 | Discharge: 2021-06-11 | Disposition: A | Discharge status: Home / Self Care | Payer: HMO | Attending: Interventional Cardiology | Admitting: Interventional Cardiology

## 2021-06-11 ENCOUNTER — Other Ambulatory Visit: Payer: Self-pay

## 2021-06-11 ENCOUNTER — Encounter (HOSPITAL_COMMUNITY)
Admission: RE | Disposition: A | Discharge status: Home / Self Care | Payer: Self-pay | Source: Home / Self Care | Attending: Interventional Cardiology

## 2021-06-11 DIAGNOSIS — Z794 Long term (current) use of insulin: Secondary | ICD-10-CM | POA: Diagnosis not present

## 2021-06-11 DIAGNOSIS — R0609 Other forms of dyspnea: Secondary | ICD-10-CM

## 2021-06-11 DIAGNOSIS — I10 Essential (primary) hypertension: Secondary | ICD-10-CM | POA: Diagnosis not present

## 2021-06-11 DIAGNOSIS — R079 Chest pain, unspecified: Secondary | ICD-10-CM | POA: Insufficient documentation

## 2021-06-11 DIAGNOSIS — E7800 Pure hypercholesterolemia, unspecified: Secondary | ICD-10-CM | POA: Diagnosis not present

## 2021-06-11 DIAGNOSIS — I700 Atherosclerosis of aorta: Secondary | ICD-10-CM | POA: Insufficient documentation

## 2021-06-11 DIAGNOSIS — Z7984 Long term (current) use of oral hypoglycemic drugs: Secondary | ICD-10-CM | POA: Diagnosis not present

## 2021-06-11 DIAGNOSIS — E1165 Type 2 diabetes mellitus with hyperglycemia: Secondary | ICD-10-CM | POA: Insufficient documentation

## 2021-06-11 HISTORY — PX: RIGHT/LEFT HEART CATH AND CORONARY ANGIOGRAPHY: CATH118266

## 2021-06-11 LAB — POCT I-STAT 7, (LYTES, BLD GAS, ICA,H+H)
Acid-Base Excess: 1 mmol/L (ref 0.0–2.0)
Bicarbonate: 26.5 mmol/L (ref 20.0–28.0)
Calcium, Ion: 1.3 mmol/L (ref 1.15–1.40)
HCT: 36 % (ref 36.0–46.0)
Hemoglobin: 12.2 g/dL (ref 12.0–15.0)
O2 Saturation: 95 %
Potassium: 4.3 mmol/L (ref 3.5–5.1)
Sodium: 140 mmol/L (ref 135–145)
TCO2: 28 mmol/L (ref 22–32)
pCO2 arterial: 46.9 mmHg (ref 32.0–48.0)
pH, Arterial: 7.36 (ref 7.350–7.450)
pO2, Arterial: 78 mmHg — ABNORMAL LOW (ref 83.0–108.0)

## 2021-06-11 LAB — POCT I-STAT EG7
Acid-Base Excess: 1 mmol/L (ref 0.0–2.0)
Acid-Base Excess: 1 mmol/L (ref 0.0–2.0)
Bicarbonate: 27.1 mmol/L (ref 20.0–28.0)
Bicarbonate: 27.5 mmol/L (ref 20.0–28.0)
Calcium, Ion: 1.3 mmol/L (ref 1.15–1.40)
Calcium, Ion: 1.31 mmol/L (ref 1.15–1.40)
HCT: 36 % (ref 36.0–46.0)
HCT: 36 % (ref 36.0–46.0)
Hemoglobin: 12.2 g/dL (ref 12.0–15.0)
Hemoglobin: 12.2 g/dL (ref 12.0–15.0)
O2 Saturation: 67 %
O2 Saturation: 69 %
Potassium: 4.3 mmol/L (ref 3.5–5.1)
Potassium: 4.3 mmol/L (ref 3.5–5.1)
Sodium: 141 mmol/L (ref 135–145)
Sodium: 141 mmol/L (ref 135–145)
TCO2: 29 mmol/L (ref 22–32)
TCO2: 29 mmol/L (ref 22–32)
pCO2, Ven: 50.4 mmHg (ref 44.0–60.0)
pCO2, Ven: 50.8 mmHg (ref 44.0–60.0)
pH, Ven: 7.339 (ref 7.250–7.430)
pH, Ven: 7.341 (ref 7.250–7.430)
pO2, Ven: 38 mmHg (ref 32.0–45.0)
pO2, Ven: 39 mmHg (ref 32.0–45.0)

## 2021-06-11 LAB — GLUCOSE, CAPILLARY
Glucose-Capillary: 121 mg/dL — ABNORMAL HIGH (ref 70–99)
Glucose-Capillary: 90 mg/dL (ref 70–99)

## 2021-06-11 SURGERY — RIGHT/LEFT HEART CATH AND CORONARY ANGIOGRAPHY
Anesthesia: LOCAL

## 2021-06-11 MED ORDER — SODIUM CHLORIDE 0.9% FLUSH: 3.0000 mL | INTRAVENOUS | Status: DC | PRN

## 2021-06-11 MED ORDER — HEPARIN SODIUM (PORCINE) 1000 UNIT/ML IJ SOLN
INTRAMUSCULAR | Status: DC | PRN
  Administered 2021-06-11: 4000 [IU] via INTRAVENOUS

## 2021-06-11 MED ORDER — VERAPAMIL HCL 2.5 MG/ML IV SOLN
INTRAVENOUS | Status: AC
  Filled 2021-06-11: qty 2

## 2021-06-11 MED ORDER — MIDAZOLAM HCL 2 MG/2ML IJ SOLN
INTRAMUSCULAR | Status: DC | PRN
  Administered 2021-06-11: 1 mg via INTRAVENOUS
  Administered 2021-06-11: 2 mg via INTRAVENOUS

## 2021-06-11 MED ORDER — ASPIRIN 81 MG PO CHEW: 81.0000 mg | CHEWABLE_TABLET | ORAL | Status: DC

## 2021-06-11 MED ORDER — SODIUM CHLORIDE 0.9 % IV SOLN: 250.0000 mL | INTRAVENOUS | Status: DC | PRN

## 2021-06-11 MED ORDER — MIDAZOLAM HCL 2 MG/2ML IJ SOLN
INTRAMUSCULAR | Status: AC
  Filled 2021-06-11: qty 2

## 2021-06-11 MED ORDER — HYDRALAZINE HCL 20 MG/ML IJ SOLN: 10.0000 mg | INTRAMUSCULAR | Status: DC | PRN

## 2021-06-11 MED ORDER — VERAPAMIL HCL 2.5 MG/ML IV SOLN
INTRAVENOUS | Status: DC | PRN
  Administered 2021-06-11: 10 mL via INTRA_ARTERIAL

## 2021-06-11 MED ORDER — METFORMIN HCL 1000 MG PO TABS
1000.0000 mg | ORAL_TABLET | Freq: Two times a day (BID) | ORAL | Status: AC
Start: 2021-06-13 — End: ?

## 2021-06-11 MED ORDER — SODIUM CHLORIDE 0.9% FLUSH: 3.0000 mL | Freq: Two times a day (BID) | INTRAVENOUS | Status: DC

## 2021-06-11 MED ORDER — SODIUM CHLORIDE 0.9 % IV SOLN: INTRAVENOUS | Status: AC

## 2021-06-11 MED ORDER — HEPARIN (PORCINE) IN NACL 1000-0.9 UT/500ML-% IV SOLN
INTRAVENOUS | Status: DC | PRN
  Administered 2021-06-11 (×2): 500 mL

## 2021-06-11 MED ORDER — LIDOCAINE HCL (PF) 1 % IJ SOLN
INTRAMUSCULAR | Status: AC
  Filled 2021-06-11: qty 30

## 2021-06-11 MED ORDER — FENTANYL CITRATE (PF) 100 MCG/2ML IJ SOLN
INTRAMUSCULAR | Status: AC
  Filled 2021-06-11: qty 2

## 2021-06-11 MED ORDER — SODIUM CHLORIDE 0.9 % WEIGHT BASED INFUSION: 1.0000 mL/kg/h | INTRAVENOUS | Status: DC

## 2021-06-11 MED ORDER — LIDOCAINE HCL (PF) 1 % IJ SOLN
INTRAMUSCULAR | Status: DC | PRN
  Administered 2021-06-11 (×2): 2 mL via INTRADERMAL

## 2021-06-11 MED ORDER — ACETAMINOPHEN 325 MG PO TABS: 650.0000 mg | ORAL_TABLET | ORAL | Status: DC | PRN

## 2021-06-11 MED ORDER — LABETALOL HCL 5 MG/ML IV SOLN: 10.0000 mg | INTRAVENOUS | Status: DC | PRN

## 2021-06-11 MED ORDER — HEPARIN (PORCINE) IN NACL 1000-0.9 UT/500ML-% IV SOLN
INTRAVENOUS | Status: AC
  Filled 2021-06-11: qty 1000

## 2021-06-11 MED ORDER — VERAPAMIL HCL 2.5 MG/ML IV SOLN
INTRAVENOUS | Status: DC | PRN
  Administered 2021-06-11: 2.5 mg via INTRA_ARTERIAL

## 2021-06-11 MED ORDER — ONDANSETRON HCL 4 MG/2ML IJ SOLN: 4.0000 mg | Freq: Four times a day (QID) | INTRAMUSCULAR | Status: DC | PRN

## 2021-06-11 MED ORDER — SODIUM CHLORIDE 0.9 % WEIGHT BASED INFUSION
3.0000 mL/kg/h | INTRAVENOUS | Status: AC
  Administered 2021-06-11: 3 mL/kg/h via INTRAVENOUS

## 2021-06-11 MED ORDER — IOHEXOL 350 MG/ML SOLN
INTRAVENOUS | Status: DC | PRN
  Administered 2021-06-11: 30 mL

## 2021-06-11 MED ORDER — FENTANYL CITRATE (PF) 100 MCG/2ML IJ SOLN
INTRAMUSCULAR | Status: DC | PRN
  Administered 2021-06-11 (×3): 25 ug via INTRAVENOUS

## 2021-06-11 SURGICAL SUPPLY — 12 items
CATH 5FR JL3.5 JR4 ANG PIG MP (CATHETERS) ×1 IMPLANT
CATH BALLN WEDGE 5F 110CM (CATHETERS) ×1 IMPLANT
DEVICE RAD COMP TR BAND LRG (VASCULAR PRODUCTS) ×1 IMPLANT
GLIDESHEATH SLEND SS 6F .021 (SHEATH) ×1 IMPLANT
GUIDEWIRE INQWIRE 1.5J.035X260 (WIRE) IMPLANT
INQWIRE 1.5J .035X260CM (WIRE) ×2
KIT HEART LEFT (KITS) ×2 IMPLANT
PACK CARDIAC CATHETERIZATION (CUSTOM PROCEDURE TRAY) ×2 IMPLANT
SHEATH GLIDE SLENDER 4/5FR (SHEATH) ×1 IMPLANT
SHEATH PROBE COVER 6X72 (BAG) ×1 IMPLANT
TRANSDUCER W/STOPCOCK (MISCELLANEOUS) ×2 IMPLANT
TUBING CIL FLEX 10 FLL-RA (TUBING) ×2 IMPLANT

## 2021-06-11 NOTE — Interval H&P Note (Signed)
Cath Lab Visit (complete for each Cath Lab visit)  Clinical Evaluation Leading to the Procedure:   ACS: No.  Non-ACS:    Anginal Classification: CCS III  Anti-ischemic medical therapy: Maximal Therapy (2 or more classes of medications)  Non-Invasive Test Results: No non-invasive testing performed  Prior CABG: No previous CABG      History and Physical Interval Note:  06/11/2021 12:34 PM  [name]  has presented today for surgery, with the diagnosis of cad - shortness of breath.  The various methods of treatment have been discussed with the patient and family. After consideration of risks, benefits and other options for treatment, the patient has consented to  Procedure(s): RIGHT/LEFT HEART CATH AND CORONARY ANGIOGRAPHY (N/A) as a surgical intervention.  The patient's history has been reviewed, patient examined, no change in status, stable for surgery.  I have reviewed the patient's chart and labs.  Questions were answered to the patient's satisfaction.     Larae Grooms

## 2021-06-12 ENCOUNTER — Encounter (HOSPITAL_COMMUNITY): Payer: Self-pay | Admitting: Interventional Cardiology

## 2021-07-01 DIAGNOSIS — E1165 Type 2 diabetes mellitus with hyperglycemia: Secondary | ICD-10-CM | POA: Diagnosis not present

## 2021-07-01 DIAGNOSIS — I10 Essential (primary) hypertension: Secondary | ICD-10-CM | POA: Diagnosis not present

## 2021-07-01 DIAGNOSIS — E782 Mixed hyperlipidemia: Secondary | ICD-10-CM | POA: Diagnosis not present

## 2021-07-03 ENCOUNTER — Encounter: Payer: Self-pay | Admitting: Cardiology

## 2021-07-03 NOTE — Telephone Encounter (Signed)
LV end diastolic pressure is normal.   There is no aortic valve stenosis.   No angiographically apparent coronary artery disease.   Aortic saturation 95%, PA saturation 68%, PA pressure 32/14, mean PA pressure 21 mmHg, mean PCWP 10 mmHg, cardiac output 5.75 L/min, cardiac index 2.94.   No significant CAD.  Normal right heart pressures.  Dye exposure minimize due to mildly decreased GFR.   Continue medical therapy.    Reviewed the above information with pt.  She reports no one spoke to her or her husband regarding the results after the cath.  She states husband was told "she came through the procedure fine and you can see her in about at hour" No other information given.  Reassurance given to pt heart tissue is receiving adequate oxygen and this would not be the cause of her chest pain.  She reports she can "barely walk and has no energy well.  Advised I will have Dr Marlou Porch tto review and call back with any further instructions.  Advised she f/u with PCP as she may need to be evaluated further for the cause of her complaints.  Pt states understanding and was grateful for the c/b and information.

## 2021-07-28 DIAGNOSIS — I10 Essential (primary) hypertension: Secondary | ICD-10-CM | POA: Diagnosis not present

## 2021-07-28 DIAGNOSIS — E782 Mixed hyperlipidemia: Secondary | ICD-10-CM | POA: Diagnosis not present

## 2021-07-28 DIAGNOSIS — E1140 Type 2 diabetes mellitus with diabetic neuropathy, unspecified: Secondary | ICD-10-CM | POA: Diagnosis not present

## 2021-07-29 DIAGNOSIS — I10 Essential (primary) hypertension: Secondary | ICD-10-CM | POA: Diagnosis not present

## 2021-07-29 DIAGNOSIS — G894 Chronic pain syndrome: Secondary | ICD-10-CM | POA: Diagnosis not present

## 2021-07-29 DIAGNOSIS — R269 Unspecified abnormalities of gait and mobility: Secondary | ICD-10-CM | POA: Diagnosis not present

## 2021-07-29 DIAGNOSIS — R5383 Other fatigue: Secondary | ICD-10-CM | POA: Diagnosis not present

## 2021-07-29 DIAGNOSIS — I700 Atherosclerosis of aorta: Secondary | ICD-10-CM | POA: Diagnosis not present

## 2021-07-29 DIAGNOSIS — M48061 Spinal stenosis, lumbar region without neurogenic claudication: Secondary | ICD-10-CM | POA: Diagnosis not present

## 2021-07-29 DIAGNOSIS — E1165 Type 2 diabetes mellitus with hyperglycemia: Secondary | ICD-10-CM | POA: Diagnosis not present

## 2021-07-29 DIAGNOSIS — N183 Chronic kidney disease, stage 3 unspecified: Secondary | ICD-10-CM | POA: Diagnosis not present

## 2021-07-29 DIAGNOSIS — Z86718 Personal history of other venous thrombosis and embolism: Secondary | ICD-10-CM | POA: Diagnosis not present

## 2021-07-29 DIAGNOSIS — F411 Generalized anxiety disorder: Secondary | ICD-10-CM | POA: Diagnosis not present

## 2021-07-29 DIAGNOSIS — E1140 Type 2 diabetes mellitus with diabetic neuropathy, unspecified: Secondary | ICD-10-CM | POA: Diagnosis not present

## 2021-08-14 DIAGNOSIS — H8303 Labyrinthitis, bilateral: Secondary | ICD-10-CM | POA: Diagnosis not present

## 2021-08-26 DIAGNOSIS — E669 Obesity, unspecified: Secondary | ICD-10-CM | POA: Diagnosis not present

## 2021-08-26 DIAGNOSIS — I10 Essential (primary) hypertension: Secondary | ICD-10-CM | POA: Diagnosis not present

## 2021-08-26 DIAGNOSIS — J449 Chronic obstructive pulmonary disease, unspecified: Secondary | ICD-10-CM | POA: Diagnosis not present

## 2021-08-26 DIAGNOSIS — G4719 Other hypersomnia: Secondary | ICD-10-CM | POA: Diagnosis not present

## 2021-08-28 DIAGNOSIS — M47816 Spondylosis without myelopathy or radiculopathy, lumbar region: Secondary | ICD-10-CM | POA: Diagnosis not present

## 2021-08-28 DIAGNOSIS — M25511 Pain in right shoulder: Secondary | ICD-10-CM | POA: Diagnosis not present

## 2021-08-28 DIAGNOSIS — M25512 Pain in left shoulder: Secondary | ICD-10-CM | POA: Diagnosis not present

## 2021-09-08 DIAGNOSIS — L649 Androgenic alopecia, unspecified: Secondary | ICD-10-CM | POA: Diagnosis not present

## 2021-09-08 DIAGNOSIS — Z85828 Personal history of other malignant neoplasm of skin: Secondary | ICD-10-CM | POA: Diagnosis not present

## 2021-09-19 ENCOUNTER — Telehealth: Payer: Self-pay | Admitting: Neurology

## 2021-09-19 ENCOUNTER — Ambulatory Visit: Payer: HMO | Admitting: Neurology

## 2021-09-19 ENCOUNTER — Encounter: Payer: Self-pay | Admitting: Neurology

## 2021-09-19 VITALS — Ht 64.0 in | Wt 204.0 lb

## 2021-09-19 DIAGNOSIS — H6121 Impacted cerumen, right ear: Secondary | ICD-10-CM

## 2021-09-19 DIAGNOSIS — H8111 Benign paroxysmal vertigo, right ear: Secondary | ICD-10-CM | POA: Diagnosis not present

## 2021-09-19 DIAGNOSIS — R269 Unspecified abnormalities of gait and mobility: Secondary | ICD-10-CM | POA: Diagnosis not present

## 2021-09-19 NOTE — Progress Notes (Signed)
? ?[facility] ? ?PATIENT: [name] ?DOB: 09/22/1940 ? ?REQUESTING CLINICIAN: [name], MD ?HISTORY FROM:  Patient  ?REASON FOR VISIT: Intermittent vertigo  ? ? ?HISTORICAL ? ?CHIEF COMPLAINT:  ?Chief Complaint  ?Patient presents with  ? New Patient (Initial Visit)  ?  Pt in 13  pt is here vertigo  pt states she has some dizziness . Pt states she can be walking around the house or reading and become dizzy .   ? ? ?HISTORY OF PRESENT ILLNESS:  ?This is a 81 year old woman with past medical history of lumbar stenosis status post spinal cord stimulator, hypertension, hyperlipidemia, diabetes mellitus, gait instability who is presenting with complaint of intermittent vertigo for the past month and a half.  Patient reports the  first episode started while she was sitting down reading the newspaper and after turning the patient, she had sudden room spinning sensation associated with nausea, she reported she could not walk and had her husband help her to the room.  She was laying down most of the day.  Since then she has been having intermittent vertigo again described as room spinning that lasted about a minute and then will subside.  She follow-up with her primary care doctor was started on meclizine and her symptoms are improving, when she get it now it feels just lightheaded.  The last episode was more than a week ago.  Denies of any fall associated with this symptom.  Patient reports she never had this in the past ? ? ?OTHER MEDICAL CONDITIONS: Spinal stenosis, spinal stimulator, DMII, hypertension, hyperlipidemia  ? ? ?REVIEW OF SYSTEMS: Full 14 system review of systems performed and negative with exception of: as noted in the HPI ? ?ALLERGIES: ?Allergies  ?Allergen Reactions  ? Hydrochlorothiazide Anaphylaxis and Other (See Comments)  ?  comatose  ? Ace Inhibitors Cough  ? Ezetimibe-Simvastatin Other (See Comments)  ?  MYALGIA  ? Indomethacin Other (See Comments)  ?  PEPTIC ULCERS   ? Lisinopril Cough  ? Lyrica [Pregabalin] Other (See Comments)  ?  Weight gain, drowsy  ? Prednisone Itching, Swelling and Other (See Comments)  ?  Facial redness/swelling   ? Simvastatin Other (See Comments)  ?  Elevated LFT's  ? Vioxx [Rofecoxib] Other (See Comments)  ?  Anxiety & chills  ? Troglitazone Other (See Comments)  ?  Pt is unaware of reaction.  ? Antihistamines, Chlorpheniramine-Type Other (See Comments)  ?  "HYPER"  ? ? ?HOME MEDICATIONS: ?Outpatient Medications Prior to Visit  ?Medication Sig Dispense Refill  ? Biotin 5000 MCG TABS Take 5,000 mcg by mouth daily.    ? calcium carbonate (OSCAL) 1500 (600 Ca) MG TABS tablet Take 1 tablet by mouth daily.    ? estradiol (ESTRACE) 1 MG tablet Take 1 mg by mouth every evening.     ? fexofenadine (ALLEGRA) 180 MG tablet Take 180 mg by mouth daily as needed for allergies or rhinitis.    ? fluticasone furoate-vilanterol (BREO ELLIPTA) 100-25 MCG/INH AEPB Inhale 1 puff into the lungs daily.    ? furosemide (LASIX) 20 MG tablet Take 20 mg by mouth as needed for fluid.  3  ? glipiZIDE (GLUCOTROL) 10 MG tablet Take 10 mg by mouth daily.    ? HYDROcodone-acetaminophen (NORCO) 7.5-325 MG tablet Take 1 tablet by mouth every 4 (four) hours as needed for moderate pain.    ? insulin glargine, 2 Unit Dial, (TOUJEO MAX SOLOSTAR) 300 UNIT/ML Solostar Pen Inject 60 Units into the  skin daily.    ? insulin glargine, 2 Unit Dial, (TOUJEO MAX SOLOSTAR) 300 UNIT/ML Solostar Pen Inject 60 Units into the skin daily.    ? losartan (COZAAR) 100 MG tablet Take 100 mg by mouth every evening.     ? metFORMIN (GLUCOPHAGE) 1000 MG tablet Take 1 tablet (1,000 mg total) by mouth 2 (two) times daily.    ? metoprolol succinate (TOPROL-XL) 50 MG 24 hr tablet Take 1 tablet (50 mg total) by mouth 2 (two) times daily. 180 tablet 3  ? Omega-3 Fatty Acids (FISH OIL) 1200 MG CAPS Take 1,200 mg by mouth 2 (two) times daily.    ? omeprazole (PRILOSEC) 40 MG capsule Take 40 mg by mouth as needed  (Heartburn).    ? rosuvastatin (CRESTOR) 10 MG tablet Take 10 mg by mouth every evening.     ? ?No facility-administered medications prior to visit.  ? ? ?PAST MEDICAL HISTORY: ?Past Medical History:  ?Diagnosis Date  ? Anemia   ? Arthritis   ? Battery end of life of spinal cord stimulator 2016  ? battery replaced.   ? COPD (chronic obstructive pulmonary disease) ([place])   ? 'Mild' per pt report  ? Diabetes mellitus   ? DVT (deep venous thrombosis) ([place])   ? Dyspnea   ? on exertion  ? GERD (gastroesophageal reflux disease)   ? History of kidney stones   ? History of stomach ulcers   ? Hypertension   ? dr Marlou Porch  ? Neuropathy   ? Rapid heartbeat   ? Sleep apnea   ? borderline study; could use a CPAP if desired.   ? ? ?PAST SURGICAL HISTORY: ?Past Surgical History:  ?Procedure Laterality Date  ? [place]  ? ovaries and uterus removed at different times.  ? APPENDECTOMY    ? ARTHOSCOPIC ROTAOR CUFF REPAIR Left 2010 or 2011  ? BACK SURGERY  x 2 1999 and 2008  ? lower back L 4 to L 5 L5 to S 1  ? BREAST BIOPSY Bilateral 05/18/1997  ? CARPAL TUNNEL RELEASE Right 2014  ? CARPAL TUNNEL RELEASE Bilateral   ? COLONOSCOPY WITH PROPOFOL N/A 10/25/2012  ? Procedure: COLONOSCOPY WITH PROPOFOL;  Surgeon: [name], MD;  [facility]: [facility];  Service: Endoscopy;  Laterality: N/A;  ? EYE SURGERY    ? eyelid surgery  ? JOINT REPLACEMENT Right 2012  ? knee  ? KNEE ARTHROSCOPY Right 2004  ? LUMBAR LAMINECTOMY/DECOMPRESSION MICRODISCECTOMY Bilateral 03/03/2018  ? Procedure: Laminectomy and Foraminotomy - Lumbar one-Lumbar two - bilateral Posterior lateral fusion;  Surgeon: [name], MD;  Location: [facility];  Service: Neurosurgery;  Laterality: Bilateral;  ? ovarian wedge resection  1966  ? RIGHT/LEFT HEART CATH AND CORONARY ANGIOGRAPHY N/A 06/11/2021  ? Procedure: RIGHT/LEFT HEART CATH AND CORONARY ANGIOGRAPHY;  Surgeon: Jettie Booze, MD;  Location: [facility];  Service: Cardiovascular;   Laterality: N/A;  ? SPINAL CORD STIMULATOR INSERTION N/A 11/03/2012  ? Procedure: LUMBAR SPINAL CORD STIMULATOR INSERTION;  Surgeon: [name], MD;  Location: [facility];  Service: Orthopedics;  Laterality: N/A;  ? SPINAL FUSION  10/2016  ? TONSILLECTOMY  1960  ? TOTAL KNEE ARTHROPLASTY Left 06/13/2018  ? Procedure: TOTAL KNEE ARTHROPLASTY;  Surgeon: Gaynelle Arabian, MD;  [facility]: [facility];  Service: Orthopedics;  Laterality: Left;  Adductor Block  ? ? ?FAMILY HISTORY: ?Family History  ?Problem Relation Age of Onset  ? Heart disease Father   ? Diabetes  Mother   ? Breast cancer Daughter 21  ? ? ?SOCIAL HISTORY: ?Social History  ? ?Socioeconomic History  ? Marital status: Married  ?  Spouse name: Not on file  ? Number of children: Not on file  ? Years of education: Not on file  ? Highest education level: Not on file  ?Occupational History  ? Not on file  ?Tobacco Use  ? Smoking status: Never  ? Smokeless tobacco: Never  ?Vaping Use  ? Vaping Use: Never used  ?Substance and Sexual Activity  ? Alcohol use: No  ? Drug use: No  ? Sexual activity: Not on file  ?Other Topics Concern  ? Not on file  ?Social History Narrative  ? Not on file  ? ?Social Determinants of Health  ? ?Financial Resource Strain: Not on file  ?Food Insecurity: Not on file  ?Transportation Needs: Not on file  ?Physical Activity: Not on file  ?Stress: Not on file  ?Social Connections: Not on file  ?Intimate Partner Violence: Not on file  ? ? ?PHYSICAL EXAM ? ?GENERAL EXAM/CONSTITUTIONAL: ?Vitals:  ?Vitals:  ? 09/19/21 0830  ?Weight: 204 lb (92.5 kg)  ?Height: '5\' 4"'$  (1.626 m)  ? ?Body mass index is 35.02 kg/m?. ?Wt Readings from Last 3 Encounters:  ?09/19/21 204 lb (92.5 kg)  ?06/11/21 205 lb (93 kg)  ?06/05/21 213 lb (96.6 kg)  ?Orthostatic VS for the past 72 hrs (Last 3 readings): ? Orthostatic BP Orthostatic Pulse  ?09/19/21 0834 152/76 84  ?09/19/21 0830 124/69 75  ? ? ?Patient is in no distress; well developed, nourished and groomed; neck is  supple ?There is right cerumen impaction.  ? ?EYES: ?Pupils round and reactive to light, Visual fields full to confrontation, Extraocular movements intacts, but there is nystagmus noted on right lateral gaze ? ?MUSCUL

## 2021-09-19 NOTE — Telephone Encounter (Signed)
[facility] adv NPR sent to GI they will call patient to schedule. ?

## 2021-09-19 NOTE — Patient Instructions (Signed)
Follow-up with your primary care doctor to have the earwax removed ?Continue meclizine as needed ?Head CT to rule out stroke, I will contact you to go over the result ?Follow-up as needed ?

## 2021-09-20 DIAGNOSIS — M47896 Other spondylosis, lumbar region: Secondary | ICD-10-CM | POA: Diagnosis not present

## 2021-09-20 DIAGNOSIS — M47816 Spondylosis without myelopathy or radiculopathy, lumbar region: Secondary | ICD-10-CM | POA: Diagnosis not present

## 2021-09-24 DIAGNOSIS — H6121 Impacted cerumen, right ear: Secondary | ICD-10-CM | POA: Diagnosis not present

## 2021-09-25 ENCOUNTER — Ambulatory Visit
Admission: RE | Admit: 2021-09-25 | Discharge: 2021-09-25 | Disposition: A | Discharge status: Home / Self Care | Payer: HMO | Source: Ambulatory Visit | Attending: Neurology | Admitting: Neurology

## 2021-09-25 DIAGNOSIS — H8111 Benign paroxysmal vertigo, right ear: Secondary | ICD-10-CM | POA: Diagnosis not present

## 2021-09-25 DIAGNOSIS — R42 Dizziness and giddiness: Secondary | ICD-10-CM | POA: Diagnosis not present

## 2021-09-29 DIAGNOSIS — K219 Gastro-esophageal reflux disease without esophagitis: Secondary | ICD-10-CM | POA: Diagnosis not present

## 2021-09-29 DIAGNOSIS — I10 Essential (primary) hypertension: Secondary | ICD-10-CM | POA: Diagnosis not present

## 2021-09-29 DIAGNOSIS — J449 Chronic obstructive pulmonary disease, unspecified: Secondary | ICD-10-CM | POA: Diagnosis not present

## 2021-09-29 DIAGNOSIS — E782 Mixed hyperlipidemia: Secondary | ICD-10-CM | POA: Diagnosis not present

## 2021-09-29 DIAGNOSIS — E1140 Type 2 diabetes mellitus with diabetic neuropathy, unspecified: Secondary | ICD-10-CM | POA: Diagnosis not present

## 2021-09-30 ENCOUNTER — Encounter: Payer: Self-pay | Admitting: Neurology

## 2021-10-09 ENCOUNTER — Other Ambulatory Visit: Payer: Self-pay | Admitting: Internal Medicine

## 2021-10-09 DIAGNOSIS — Z1231 Encounter for screening mammogram for malignant neoplasm of breast: Secondary | ICD-10-CM

## 2021-10-16 NOTE — Therapy (Signed)
OUTPATIENT PHYSICAL THERAPY VESTIBULAR EVALUATION     Patient Name: [name] MRN: [id number] DOB:[birth year], 81 y.o., female 46 Date: 10/21/2021  PCP: [name], MD  REFERRING PROVIDER: [name], MD    PT End of Session - 10/21/21 1628     Visit Number 1    Number of Visits 7    Date for PT Re-Evaluation 12/02/21    Authorization Type [employer]    PT Start Time 1450    PT Stop Time 1535    PT Time Calculation (min) 45 min    Equipment Utilized During Treatment Gait belt    Activity Tolerance Patient tolerated treatment well    Behavior During Therapy WFL for tasks assessed/performed             Past Medical History:  Diagnosis Date   Anemia    Arthritis    Battery end of life of spinal cord stimulator 2016   battery replaced.    COPD (chronic obstructive pulmonary disease) ([facility])    'Mild' per pt report   Diabetes mellitus    DVT (deep venous thrombosis) ([facility])    Dyspnea    on exertion   GERD (gastroesophageal reflux disease)    History of kidney stones    History of stomach ulcers    Hypertension    dr Marlou Porch   Neuropathy    Rapid heartbeat    Sleep apnea    borderline study; could use a CPAP if desired.    Past Surgical History:  Procedure Laterality Date   ABDOMINAL HYSTERECTOMY  1979 & 1981   ovaries and uterus removed at different times.   APPENDECTOMY     ARTHOSCOPIC ROTAOR CUFF REPAIR Left 2010 or 2011   BACK SURGERY  x 2 1999 and 2008   lower back L 4 to L 5 L5 to S 1   BREAST BIOPSY Bilateral 05/18/1997   CARPAL TUNNEL RELEASE Right 2014   CARPAL TUNNEL RELEASE Bilateral    COLONOSCOPY WITH PROPOFOL N/A 10/25/2012   Procedure: COLONOSCOPY WITH PROPOFOL;  Surgeon: [name], MD;  [facility];  Service: Endoscopy;  Laterality: N/A;   EYE SURGERY     eyelid surgery   JOINT REPLACEMENT Right 2012   knee   KNEE ARTHROSCOPY Right 2004   LUMBAR LAMINECTOMY/DECOMPRESSION MICRODISCECTOMY Bilateral  03/03/2018   Procedure: Laminectomy and Foraminotomy - Lumbar one-Lumbar two - bilateral Posterior lateral fusion;  Surgeon: [name], MD;  Location: [facility];  Service: Neurosurgery;  Laterality: Bilateral;   ovarian wedge resection  1966   RIGHT/LEFT HEART CATH AND CORONARY ANGIOGRAPHY N/A 06/11/2021   Procedure: RIGHT/LEFT HEART CATH AND CORONARY ANGIOGRAPHY;  Surgeon: Jettie Booze, MD;  Location: [facility];  Service: Cardiovascular;  Laterality: N/A;   SPINAL CORD STIMULATOR INSERTION N/A 11/03/2012   Procedure: LUMBAR SPINAL CORD STIMULATOR INSERTION;  Surgeon: [name], MD;  Location: [facility];  Service: Orthopedics;  Laterality: N/A;   SPINAL FUSION  10/2016   TONSILLECTOMY  1960   TOTAL KNEE ARTHROPLASTY Left 06/13/2018   Procedure: TOTAL KNEE ARTHROPLASTY;  Surgeon: Gaynelle Arabian, MD;  [facility];  Service: Orthopedics;  Laterality: Left;  Adductor Block   Patient Active Problem List   Diagnosis Date Noted   Aortic atherosclerosis ([place]) 06/05/2021   Chest pain of uncertain etiology 22/06/5425   Temperature intolerance 10/23/2019   Abnormal ECG 10/23/2019   Acute bronchitis 10/23/2019   Acute tracheitis without obstruction 10/23/2019   Allergic  rhinitis 10/23/2019   Anemia, iron deficiency 10/23/2019   Anxiety disorder 10/23/2019   Arthralgia of lower leg 10/23/2019   Avitaminosis D 10/23/2019   Cannot sleep 10/23/2019   Combined fat and carbohydrate induced hyperlipemia 10/23/2019   Cough 10/23/2019   Diabetic peripheral neuropathy associated with type 2 diabetes mellitus ([place]) 10/23/2019   Encounter for other procedures for purposes other than remedying health state 10/23/2019   Fam hx-ischem heart disease 10/23/2019   Fast heart beat 10/23/2019   Fatigue 10/23/2019   Gastric ulcer with hemorrhage and perforation but without obstruction ([facility]) 10/23/2019   Gastro-esophageal reflux disease without esophagitis 10/23/2019   Generalized hyperhidrosis  10/23/2019   Hereditary and idiopathic neuropathy 10/23/2019   Muscle inflammation 10/23/2019   Other long term (current) drug therapy 10/23/2019   Other specified diseases of anus and rectum 10/23/2019   Thrombophlebitis of the femoral vein ([place]) 10/23/2019   Type 2 diabetes mellitus with hyperglycemia ([place]) 10/23/2019   Urticaria 10/23/2019   Tendonitis, Achilles, left 05/15/2019   Aftercare 08/22/2018   History of total knee replacement, left 08/22/2018   OA (osteoarthritis) of knee 06/13/2018   S/P lumbar laminectomy 03/03/2018   Sacroiliac joint pain 07/14/2017   Pain in joint of left shoulder 06/15/2017   Pain in joint of right shoulder 06/15/2017   Osteoarthritis of glenohumeral joint 06/15/2017   S/P lumbar spinal fusion 10/09/2016   DOE (dyspnea on exertion) 01/19/2014   Pure hypercholesterolemia 01/19/2014   Essential hypertension, benign 01/19/2014   Obesity, unspecified 01/19/2014    ONSET DATE: a couple months ago  REFERRING DIAG: R42 (ICD-10-CM) - Dizziness and giddiness   THERAPY DIAG:  Dizziness and giddiness  Unsteadiness on feet  Other abnormalities of gait and mobility  Rationale for Evaluation and Treatment Rehabilitation  SUBJECTIVE:   SUBJECTIVE STATEMENT: Patient reports dizziness for the past couple of months without known mechanism. Reports that it first occurred when sitting reading the newspaper. This episode lasted hours and was not able to mobilize normally for a couple days. Since then, episodes have lasted 10-15 minutes and are less severe. Unable to specify aggravating factors- sometimes occur when reading, watching tv, or spontaneous. Seeing a chiropractor for her neck, which has helped with her dizziness. Denies head trauma, infection/illness, vision changes/double vision, hearing loss, tinnitus, migraines. Reports a "strange" sensation over the top of her head. Was told by a MD that she needed to get her R ear cleaned but when she went to do  so was told that there wasn't much there. Describes dizziness as spinning, sometimes lightheadedness.   Pt accompanied by: self  PERTINENT HISTORY: anemia, [place] stimulator, COPD, DM, GERD, HTN, neuropathy, L RTC repair, back surgery 2018, 2019, B TKA   PAIN:  Are you having pain? Yes: NPRS scale: 7/10 Pain location: generalized Pain description:   Aggravating factors: constant Relieving factors: none  PRECAUTIONS: Fall and Other: [place] stimulator  WEIGHT BEARING RESTRICTIONS No  FALLS: Has patient fallen in last 6 months? No  LIVING ENVIRONMENT: Lives with: lives with their spouse Lives in: House/apartment Stairs:  3 steps to get inside; 2nd story home  Has following equipment at home: Single point cane and Walker - 4 wheeled  PLOF: Independent  PATIENT GOALS improve dizziness  OBJECTIVE:   DIAGNOSTIC FINDINGS: 09/25/21 head CT: This is a normal age-appropriate CT scan of the head.  COGNITION: Overall cognitive status: Within functional limits for tasks assessed   SENSATION: Reports N/T in L foot d/t neuropathy  POSTURE: rounded shoulders and forward head   GAIT: Gait pattern:  significant antalgia and forward trunk d/t L hip weakness/instability Assistive device utilized: Single point cane Level of assistance: Modified independence; slow; unsteady    PATIENT SURVEYS:  FOTO [phone]   VESTIBULAR ASSESSMENT   GENERAL OBSERVATION:     OCULOMOTOR EXAM:   Ocular Alignment: normal   Ocular ROM: No Limitations   Spontaneous Nystagmus: absent   Gaze-Induced Nystagmus: absent   Smooth Pursuits: intact   Saccades: intact   Convergence/Divergence: 5 inches (L eye limited convergence- patient reports she has a "lazy eye")    VESTIBULAR - OCULAR REFLEX:    Slow VOR: Normal and Comment: horizontal and vertical   VOR Cancellation: Corrective Saccades to the L   Head-Impulse Test: HIT Right: positive HIT Left: negative    POSITIONAL TESTING:  Right Roll Test:  negative; Duration: - Left Roll Test: negative; Duration: -  Right Sidelying: negative; Duration: - Left Sidelying: negative; Duration: -     M-CTSIB  Condition 1: Firm Surface, EO 30 Sec, Mild Sway  Condition 2: Firm Surface, EC 30 Sec, Moderate and Severe Sway  Condition 3: Foam Surface, EO 30 Sec, Mild Sway *unable to perform in romberg  Condition 4: Foam Surface, EC 8 Sec,  unable  Sway         VESTIBULAR TREATMENT:   PATIENT EDUCATION: Education details: prognosis, POC, HEP; edu on vestibular hypofunction  Person educated: Patient Education method: Explanation, Demonstration, and Handouts Education comprehension: verbalized understanding  Access Code: [email or web address] URL: [email or web address] Date: 10/21/2021 Prepared by: [facility]  Exercises - Seated Gaze Stabilization with Head Rotation  - 1 x daily - 5 x weekly - 2-3 sets - 30 sec hold - Seated Gaze Stabilization with Head Nod  - 1 x daily - 5 x weekly - 2-3 sets - 30 sec hold - Romberg Stance  - 1 x daily - 5 x weekly - 2-3 sets - 30 sec hold - Standing Balance in Corner with Eyes Closed  - 1 x daily - 5 x weekly - 2-3 sets - 30 sec hold   GOALS: Goals reviewed with patient? Yes  SHORT TERM GOALS: Target date: 11/04/2021  Patient to be independent with initial HEP. Baseline: HEP initiated Goal status: INITIAL    LONG TERM GOALS: Target date: 12/02/2021  Patient to be independent with advanced HEP. Baseline: Not yet initiated  Goal status: INITIAL  Patient to demonstrate mild sway with M-CTSIB condition with eyes closed/FIRM surface in order to improve safety in environments with dim lighting. Baseline: moderate sway Goal status: INITIAL  Patient to report 60% improvement in dizziness.  Baseline: - Goal status: INITIAL    ASSESSMENT:  CLINICAL IMPRESSION:   Patient is a 81 y/o F presenting to OPPT with c/o dizziness for the past couple  months. Episodes are described as "spinning, sometimes lightheadedness" and lasts minutes. Unable to identify aggravating factors- episodes sometimes occur when reading, watching tv, or can be spontaneous. Denies head trauma, infection/illness, vision changes/double vision, hearing loss, tinnitus, migraines. Reports a "strange" sensation over the top of her head. Patient today presenting with forward flexed posture, imbalance with EC, and gait deviations- patient with multiple orthopedic conditions causing chronic pain at baseline. Oculomotor exam revealed Convergence insufficiency, positive R HIT. Positional testing was negative.Unable to reproduce dizziness today, thus plan to test orthostatics next session. Patient was educated on gentle balance and VOR HEP and reported understanding. Would  benefit from skilled PT services 1x/week for 6 weeks to address aforementioned impairments in order to optimize level of function.     OBJECTIVE IMPAIRMENTS Abnormal gait, decreased balance, difficulty walking, dizziness, improper body mechanics, postural dysfunction, and pain.   ACTIVITY LIMITATIONS bending, standing, squatting, stairs, transfers, bathing, and dressing  PARTICIPATION LIMITATIONS: meal prep, cleaning, laundry, driving, community activity, and church  PERSONAL FACTORS Age, Fitness, Past/current experiences, Time since onset of injury/illness/exacerbation, and 3+ comorbidities: anemia, [place] stimulator, COPD, DM, GERD, HTN, neuropathy, L RTC repair, back surgery 2018, 2019, B TKA  are also affecting patient's functional outcome.   REHAB POTENTIAL: Fair    CLINICAL DECISION MAKING: Unstable/unpredictable  EVALUATION COMPLEXITY: High   PLAN: PT FREQUENCY: 1x/week  PT DURATION: 6 weeks  PLANNED INTERVENTIONS: Therapeutic exercises, Therapeutic activity, Neuromuscular re-education, Balance training, Gait training, Patient/Family education, Joint mobilization, Stair training, Vestibular training,  Canalith repositioning, Dry Needling, Cryotherapy, Moist heat, Taping, Manual therapy, and Re-evaluation  PLAN FOR NEXT SESSION: check orthostatics, assess tolerance for VOR and add optokinetics for greater challenge, balance EC   Janene Harvey, PT, DPT 10/21/21 4:37 PM  [facility] at [facility] [address], [place], [place] [address] Phone # [phone] Fax # [phone]

## 2021-10-21 ENCOUNTER — Encounter: Payer: Self-pay | Admitting: Physical Therapy

## 2021-10-21 ENCOUNTER — Ambulatory Visit: Payer: HMO | Attending: Internal Medicine | Admitting: Physical Therapy

## 2021-10-21 DIAGNOSIS — R2689 Other abnormalities of gait and mobility: Secondary | ICD-10-CM | POA: Insufficient documentation

## 2021-10-21 DIAGNOSIS — R42 Dizziness and giddiness: Secondary | ICD-10-CM | POA: Insufficient documentation

## 2021-10-21 DIAGNOSIS — R2681 Unsteadiness on feet: Secondary | ICD-10-CM | POA: Insufficient documentation

## 2021-10-23 DIAGNOSIS — M47816 Spondylosis without myelopathy or radiculopathy, lumbar region: Secondary | ICD-10-CM | POA: Diagnosis not present

## 2021-10-29 DIAGNOSIS — E559 Vitamin D deficiency, unspecified: Secondary | ICD-10-CM | POA: Diagnosis not present

## 2021-10-29 DIAGNOSIS — N1831 Chronic kidney disease, stage 3a: Secondary | ICD-10-CM | POA: Diagnosis not present

## 2021-10-29 DIAGNOSIS — M48061 Spinal stenosis, lumbar region without neurogenic claudication: Secondary | ICD-10-CM | POA: Diagnosis not present

## 2021-10-29 DIAGNOSIS — R5383 Other fatigue: Secondary | ICD-10-CM | POA: Diagnosis not present

## 2021-10-29 DIAGNOSIS — I700 Atherosclerosis of aorta: Secondary | ICD-10-CM | POA: Diagnosis not present

## 2021-10-29 DIAGNOSIS — E1140 Type 2 diabetes mellitus with diabetic neuropathy, unspecified: Secondary | ICD-10-CM | POA: Diagnosis not present

## 2021-10-29 DIAGNOSIS — J449 Chronic obstructive pulmonary disease, unspecified: Secondary | ICD-10-CM | POA: Diagnosis not present

## 2021-10-29 DIAGNOSIS — G894 Chronic pain syndrome: Secondary | ICD-10-CM | POA: Diagnosis not present

## 2021-10-29 DIAGNOSIS — Z86718 Personal history of other venous thrombosis and embolism: Secondary | ICD-10-CM | POA: Diagnosis not present

## 2021-10-29 DIAGNOSIS — R269 Unspecified abnormalities of gait and mobility: Secondary | ICD-10-CM | POA: Diagnosis not present

## 2021-10-29 DIAGNOSIS — Z1331 Encounter for screening for depression: Secondary | ICD-10-CM | POA: Diagnosis not present

## 2021-10-29 DIAGNOSIS — Z79899 Other long term (current) drug therapy: Secondary | ICD-10-CM | POA: Diagnosis not present

## 2021-10-29 DIAGNOSIS — I10 Essential (primary) hypertension: Secondary | ICD-10-CM | POA: Diagnosis not present

## 2021-10-29 DIAGNOSIS — N183 Chronic kidney disease, stage 3 unspecified: Secondary | ICD-10-CM | POA: Diagnosis not present

## 2021-10-29 DIAGNOSIS — Z0000 Encounter for general adult medical examination without abnormal findings: Secondary | ICD-10-CM | POA: Diagnosis not present

## 2021-10-29 DIAGNOSIS — D509 Iron deficiency anemia, unspecified: Secondary | ICD-10-CM | POA: Diagnosis not present

## 2021-10-29 DIAGNOSIS — F411 Generalized anxiety disorder: Secondary | ICD-10-CM | POA: Diagnosis not present

## 2021-11-03 ENCOUNTER — Ambulatory Visit: Payer: HMO | Admitting: Physical Therapy

## 2021-11-03 ENCOUNTER — Encounter: Payer: Self-pay | Admitting: Physical Therapy

## 2021-11-03 DIAGNOSIS — R2689 Other abnormalities of gait and mobility: Secondary | ICD-10-CM

## 2021-11-03 DIAGNOSIS — R42 Dizziness and giddiness: Secondary | ICD-10-CM

## 2021-11-03 DIAGNOSIS — R2681 Unsteadiness on feet: Secondary | ICD-10-CM

## 2021-11-04 ENCOUNTER — Ambulatory Visit: Payer: HMO | Admitting: Physical Therapy

## 2021-11-12 ENCOUNTER — Ambulatory Visit: Payer: HMO | Attending: Internal Medicine

## 2021-11-12 DIAGNOSIS — R2681 Unsteadiness on feet: Secondary | ICD-10-CM | POA: Insufficient documentation

## 2021-11-12 DIAGNOSIS — R42 Dizziness and giddiness: Secondary | ICD-10-CM | POA: Diagnosis not present

## 2021-11-12 DIAGNOSIS — R2689 Other abnormalities of gait and mobility: Secondary | ICD-10-CM | POA: Diagnosis not present

## 2021-11-12 NOTE — Therapy (Signed)
OUTPATIENT PHYSICAL THERAPY VESTIBULAR TREATMENT     Patient Name: [name] MRN: [id number] DOB:[birth year], 81 y.o., female 88 Date: 11/12/2021  PCP: [name], MD  REFERRING PROVIDER: [name], MD    PT End of Session - 11/12/21 1319     Visit Number 3    Number of Visits 7    Date for PT Re-Evaluation 12/02/21    Authorization Type [employer]    PT Start Time 1318    PT Stop Time 1400    PT Time Calculation (min) 42 min    Equipment Utilized During Treatment Gait belt    Activity Tolerance Patient tolerated treatment well    Behavior During Therapy WFL for tasks assessed/performed              Past Medical History:  Diagnosis Date   Anemia    Arthritis    Battery end of life of spinal cord stimulator 2016   battery replaced.    COPD (chronic obstructive pulmonary disease) (HCC)    'Mild' per pt report   Diabetes mellitus    DVT (deep venous thrombosis) (HCC)    Dyspnea    on exertion   GERD (gastroesophageal reflux disease)    History of kidney stones    History of stomach ulcers    Hypertension    dr Marlou Porch   Neuropathy    Rapid heartbeat    Sleep apnea    borderline study; could use a CPAP if desired.    Past Surgical History:  Procedure Laterality Date   ABDOMINAL HYSTERECTOMY  1979 & 1981   ovaries and uterus removed at different times.   APPENDECTOMY     ARTHOSCOPIC ROTAOR CUFF REPAIR Left 2010 or 2011   BACK SURGERY  x 2 1999 and 2008   lower back L 4 to L 5 L5 to S 1   BREAST BIOPSY Bilateral 05/18/1997   CARPAL TUNNEL RELEASE Right 2014   CARPAL TUNNEL RELEASE Bilateral    COLONOSCOPY WITH PROPOFOL N/A 10/25/2012   Procedure: COLONOSCOPY WITH PROPOFOL;  Surgeon: [name], MD;  Location: [facility];  Service: Endoscopy;  Laterality: N/A;   EYE SURGERY     eyelid surgery   JOINT REPLACEMENT Right 2012   knee   KNEE ARTHROSCOPY Right 2004   LUMBAR LAMINECTOMY/DECOMPRESSION MICRODISCECTOMY Bilateral  03/03/2018   Procedure: Laminectomy and Foraminotomy - Lumbar one-Lumbar two - bilateral Posterior lateral fusion;  Surgeon: [name], MD;  Location: [place];  Service: Neurosurgery;  Laterality: Bilateral;   ovarian wedge resection  1966   RIGHT/LEFT HEART CATH AND CORONARY ANGIOGRAPHY N/A 06/11/2021   Procedure: RIGHT/LEFT HEART CATH AND CORONARY ANGIOGRAPHY;  Surgeon: Jettie Booze, MD;  Location: [facility];  Service: Cardiovascular;  Laterality: N/A;   SPINAL CORD STIMULATOR INSERTION N/A 11/03/2012   Procedure: LUMBAR SPINAL CORD STIMULATOR INSERTION;  Surgeon: [name], MD;  Location: [place];  Service: Orthopedics;  Laterality: N/A;   SPINAL FUSION  10/2016   TONSILLECTOMY  1960   TOTAL KNEE ARTHROPLASTY Left 06/13/2018   Procedure: TOTAL KNEE ARTHROPLASTY;  Surgeon: Gaynelle Arabian, MD;  [facility];  Service: Orthopedics;  Laterality: Left;  Adductor Block   Patient Active Problem List   Diagnosis Date Noted   Aortic atherosclerosis ([place]) 06/05/2021   Chest pain of uncertain etiology 35/00/9381   Temperature intolerance 10/23/2019   Abnormal ECG 10/23/2019   Acute bronchitis 10/23/2019   Acute tracheitis without obstruction 10/23/2019  Allergic rhinitis 10/23/2019   Anemia, iron deficiency 10/23/2019   Anxiety disorder 10/23/2019   Arthralgia of lower leg 10/23/2019   Avitaminosis D 10/23/2019   Cannot sleep 10/23/2019   Combined fat and carbohydrate induced hyperlipemia 10/23/2019   Cough 10/23/2019   Diabetic peripheral neuropathy associated with type 2 diabetes mellitus ([place]) 10/23/2019   Encounter for other procedures for purposes other than remedying health state 10/23/2019   Fam hx-ischem heart disease 10/23/2019   Fast heart beat 10/23/2019   Fatigue 10/23/2019   Gastric ulcer with hemorrhage and perforation but without obstruction ([place]) 10/23/2019   Gastro-esophageal reflux disease without esophagitis 10/23/2019   Generalized hyperhidrosis  10/23/2019   Hereditary and idiopathic neuropathy 10/23/2019   Muscle inflammation 10/23/2019   Other long term (current) drug therapy 10/23/2019   Other specified diseases of anus and rectum 10/23/2019   Thrombophlebitis of the femoral vein ([place]) 10/23/2019   Type 2 diabetes mellitus with hyperglycemia ([place]) 10/23/2019   Urticaria 10/23/2019   Tendonitis, Achilles, left 05/15/2019   Aftercare 08/22/2018   History of total knee replacement, left 08/22/2018   OA (osteoarthritis) of knee 06/13/2018   S/P lumbar laminectomy 03/03/2018   Sacroiliac joint pain 07/14/2017   Pain in joint of left shoulder 06/15/2017   Pain in joint of right shoulder 06/15/2017   Osteoarthritis of glenohumeral joint 06/15/2017   S/P lumbar spinal fusion 10/09/2016   DOE (dyspnea on exertion) 01/19/2014   Pure hypercholesterolemia 01/19/2014   Essential hypertension, benign 01/19/2014   Obesity, unspecified 01/19/2014    ONSET DATE: a couple months ago  REFERRING DIAG: R42 (ICD-10-CM) - Dizziness and giddiness   THERAPY DIAG:  Dizziness and giddiness  Unsteadiness on feet  Other abnormalities of gait and mobility  Rationale for Evaluation and Treatment Rehabilitation  SUBJECTIVE:   SUBJECTIVE STATEMENT: Pt notes very recent episode of dizziness that was experienced about an hour ago when she was driving and had to pull over to rest and had some lingering effects with walking into the house. Per report she was driving at a steady speed   Pt accompanied by: self  PERTINENT HISTORY: anemia, [place] stimulator, COPD, DM, GERD, HTN, neuropathy, L RTC repair, back surgery 2018, 2019, B TKA   PAIN:  Are you having pain? Yes: NPRS scale: 7/10 Pain location: generalized Pain description:   Aggravating factors: constant Relieving factors: none  PRECAUTIONS: Fall and Other: [place] stimulator  PATIENT GOALS improve dizziness  OBJECTIVE:     TODAY'S TREATMENT: 11/12/21 Activity Comments  sitting VOR  horizontal 2x30" No dizziness, cues for speed vs amplitude  sitting VOR vertical 30" No dizziness, no gaze instability noted  Standing VOR horizontal 30" No dizziness     Standing VOR vertical 30" in front of blinds No dizziness   Standing VOR cancellation 30" C/o difficulty tracking target but no dizziness   1/2 turns to targets 10x and CGA Turns left/right to find playing cards on table, no symptoms  Standing on foam: EO/EC x 15 sec, head turns/vertical EO/EC, EC head tilt x 15 sec 2 rounds            Orthostatic Testing   Supine Sitting *c/o mild wooziness Standing  x1 Minute Standing x 3 Minutes  BP 118/77 110/77 138/84 NT d/t limited standing tolerance  HR 68 84 87 NT d/t limited standing tolerance     PATIENT EDUCATION: Education details: edu on results of orthostatic testing, edu on HEP update and appropriate level of dizziness with exercises Person  educated: Patient Education method: Explanation, Demonstration, Tactile cues, Verbal cues, and Handouts Education comprehension: verbalized understanding and returned demonstration   Below measures were taken at time of initial evaluation unless otherwise specified:  DIAGNOSTIC FINDINGS: 09/25/21 head CT: This is a normal age-appropriate CT scan of the head.  COGNITION: Overall cognitive status: Within functional limits for tasks assessed   SENSATION: Reports N/T in L foot d/t neuropathy    POSTURE: rounded shoulders and forward head   GAIT: Gait pattern:  significant antalgia and forward trunk d/t L hip weakness/instability Assistive device utilized: Single point cane Level of assistance: Modified independence; slow; unsteady    PATIENT SURVEYS:  FOTO [phone]   VESTIBULAR ASSESSMENT   GENERAL OBSERVATION:     OCULOMOTOR EXAM:   Ocular Alignment: normal   Ocular ROM: No Limitations   Spontaneous Nystagmus: absent   Gaze-Induced Nystagmus: absent   Smooth Pursuits: intact   Saccades:  intact   Convergence/Divergence: 5 inches (L eye limited convergence- patient reports she has a "lazy eye")    VESTIBULAR - OCULAR REFLEX:    Slow VOR: Normal and Comment: horizontal and vertical   VOR Cancellation: Corrective Saccades to the L   Head-Impulse Test: HIT Right: positive HIT Left: negative    POSITIONAL TESTING:  Right Roll Test: negative; Duration: - Left Roll Test: negative; Duration: -  Right Sidelying: negative; Duration: - Left Sidelying: negative; Duration: -     M-CTSIB  Condition 1: Firm Surface, EO 30 Sec, Mild Sway  Condition 2: Firm Surface, EC 30 Sec, Moderate and Severe Sway  Condition 3: Foam Surface, EO 30 Sec, Mild Sway *unable to perform in romberg  Condition 4: Foam Surface, EC 8 Sec,  unable  Sway         VESTIBULAR TREATMENT:   PATIENT EDUCATION: Education details: prognosis, POC, HEP; edu on vestibular hypofunction  Person educated: Patient Education method: Explanation, Demonstration, and Handouts Education comprehension: verbalized understanding  Access Code: [email or web address] URL: [email or web address] Date: 10/21/2021 Prepared by: [facility]  Exercises - Seated Gaze Stabilization with Head Rotation  - 1 x daily - 5 x weekly - 2-3 sets - 30 sec hold - Seated Gaze Stabilization with Head Nod  - 1 x daily - 5 x weekly - 2-3 sets - 30 sec hold - Romberg Stance  - 1 x daily - 5 x weekly - 2-3 sets - 30 sec hold - Standing Balance in Corner with Eyes Closed  - 1 x daily - 5 x weekly - 2-3 sets - 30 sec hold   GOALS: Goals reviewed with patient? Yes  SHORT TERM GOALS: Target date: 11/04/2021  Patient to be independent with initial HEP. Baseline: HEP initiated Goal status: IN PROGRESS    LONG TERM GOALS: Target date: 12/02/2021  Patient to be independent with advanced HEP. Baseline: Not yet initiated  Goal status: IN PROGRESS  Patient to demonstrate mild sway with M-CTSIB  condition with eyes closed/FIRM surface in order to improve safety in environments with dim lighting. Baseline: moderate sway Goal status: IN PROGRESS  Patient to report 60% improvement in dizziness.  Baseline: - Goal status: IN PROGRESS    ASSESSMENT:  CLINICAL IMPRESSION:  Pt notes that the vertigo/dizziness episodes are sporadic and are not positional in nature.  Notes that when in the pool and performing vigorous movemnts this does not provoke symptoms and usually happens when sitting, reading, watching TV.  No provocation with VOR activities in sitting/standing.  When standing on compliant surface eyes closed tends to LOB right side. Continued sessions to progress POC details    OBJECTIVE IMPAIRMENTS Abnormal gait, decreased balance, difficulty walking, dizziness, improper body mechanics, postural dysfunction, and pain.   ACTIVITY LIMITATIONS bending, standing, squatting, stairs, transfers, bathing, and dressing  PARTICIPATION LIMITATIONS: meal prep, cleaning, laundry, driving, community activity, and church  PERSONAL FACTORS Age, Fitness, Past/current experiences, Time since onset of injury/illness/exacerbation, and 3+ comorbidities: anemia, [place] stimulator, COPD, DM, GERD, HTN, neuropathy, L RTC repair, back surgery 2018, 2019, B TKA  are also affecting patient's functional outcome.   REHAB POTENTIAL: Fair    CLINICAL DECISION MAKING: Unstable/unpredictable  EVALUATION COMPLEXITY: High   PLAN: PT FREQUENCY: 1x/week  PT DURATION: 6 weeks  PLANNED INTERVENTIONS: Therapeutic exercises, Therapeutic activity, Neuromuscular re-education, Balance training, Gait training, Patient/Family education, Joint mobilization, Stair training, Vestibular training, Canalith repositioning, Dry Needling, Cryotherapy, Moist heat, Taping, Manual therapy, and Re-evaluation  PLAN FOR NEXT SESSION: continue working on horizontal VOR activities and 1/2 turns, balance with EC, general balance and hip  strengthening to improve reliance of SPC   Janene [place], PT, DPT 11/12/21 1:20 PM  [facility] at [facility] [address], [place], [place] [address] Phone # [phone] Fax # [phone]

## 2021-11-13 ENCOUNTER — Ambulatory Visit
Admission: RE | Admit: 2021-11-13 | Discharge: 2021-11-13 | Disposition: A | Discharge status: Home / Self Care | Payer: HMO | Source: Ambulatory Visit | Attending: Internal Medicine | Admitting: Internal Medicine

## 2021-11-13 DIAGNOSIS — Z1231 Encounter for screening mammogram for malignant neoplasm of breast: Secondary | ICD-10-CM

## 2021-11-15 DIAGNOSIS — Z981 Arthrodesis status: Secondary | ICD-10-CM | POA: Diagnosis not present

## 2021-11-15 DIAGNOSIS — M5136 Other intervertebral disc degeneration, lumbar region: Secondary | ICD-10-CM | POA: Diagnosis not present

## 2021-11-19 DIAGNOSIS — M7662 Achilles tendinitis, left leg: Secondary | ICD-10-CM | POA: Diagnosis not present

## 2021-11-20 ENCOUNTER — Ambulatory Visit: Payer: HMO

## 2021-11-20 DIAGNOSIS — R42 Dizziness and giddiness: Secondary | ICD-10-CM | POA: Diagnosis not present

## 2021-11-20 DIAGNOSIS — R2689 Other abnormalities of gait and mobility: Secondary | ICD-10-CM

## 2021-11-20 DIAGNOSIS — R2681 Unsteadiness on feet: Secondary | ICD-10-CM

## 2021-11-20 NOTE — Therapy (Signed)
OUTPATIENT PHYSICAL THERAPY VESTIBULAR TREATMENT and D/C Summary     Patient Name: [name] MRN: [id number] DOB:1941/02/24, 81 y.o., female Today's Date: 11/20/2021 PHYSICAL THERAPY DISCHARGE SUMMARY  Visits from Start of Care: 4  Current functional level related to goals / functional outcomes: Pt notes some improvement in symptoms with approx 50% improvement   Remaining deficits: Symptoms of dizziness are sporadic   Education / Equipment: HEP   Patient agrees to discharge. Patient goals were partially met. Patient is being discharged due to being pleased with the current functional level.  PCP: [name], MD  REFERRING PROVIDER: [name], MD    PT End of Session - 11/20/21 1404     Visit Number 4    Number of Visits 7    Date for PT Re-Evaluation 12/02/21    Authorization Type [employer]    PT Start Time 1400    PT Stop Time 1445    PT Time Calculation (min) 45 min    Equipment Utilized During Treatment Gait belt    Activity Tolerance Patient tolerated treatment well    Behavior During Therapy WFL for tasks assessed/performed              Past Medical History:  Diagnosis Date   Anemia    Arthritis    Battery end of life of spinal cord stimulator 2016   battery replaced.    COPD (chronic obstructive pulmonary disease) (HCC)    'Mild' per pt report   Diabetes mellitus    DVT (deep venous thrombosis) (HCC)    Dyspnea    on exertion   GERD (gastroesophageal reflux disease)    History of kidney stones    History of stomach ulcers    Hypertension    dr Marlou Porch   Neuropathy    Rapid heartbeat    Sleep apnea    borderline study; could use a CPAP if desired.    Past Surgical History:  Procedure Laterality Date   ABDOMINAL HYSTERECTOMY  1979 & 1981   ovaries and uterus removed at different times.   APPENDECTOMY     ARTHOSCOPIC ROTAOR CUFF REPAIR Left 2010 or 2011   BACK SURGERY  x 2 1999 and 2008   lower back L 4 to L 5 L5 to S 1    BREAST BIOPSY Bilateral 05/18/1997   CARPAL TUNNEL RELEASE Right 2014   CARPAL TUNNEL RELEASE Bilateral    COLONOSCOPY WITH PROPOFOL N/A 10/25/2012   Procedure: COLONOSCOPY WITH PROPOFOL;  Surgeon: [name], MD;  [facility];  Service: Endoscopy;  Laterality: N/A;   EYE SURGERY     eyelid surgery   JOINT REPLACEMENT Right 2012   knee   KNEE ARTHROSCOPY Right 2004   LUMBAR LAMINECTOMY/DECOMPRESSION MICRODISCECTOMY Bilateral 03/03/2018   Procedure: Laminectomy and Foraminotomy - Lumbar one-Lumbar two - bilateral Posterior lateral fusion;  Surgeon: [name], MD;  Location: [place];  Service: Neurosurgery;  Laterality: Bilateral;   ovarian wedge resection  1966   RIGHT/LEFT HEART CATH AND CORONARY ANGIOGRAPHY N/A 06/11/2021   Procedure: RIGHT/LEFT HEART CATH AND CORONARY ANGIOGRAPHY;  Surgeon: Jettie Booze, MD;  Location: [place] [facility];  Service: Cardiovascular;  Laterality: N/A;   SPINAL CORD STIMULATOR INSERTION N/A 11/03/2012   Procedure: LUMBAR SPINAL CORD STIMULATOR INSERTION;  Surgeon: [name], MD;  Location: [place];  Service: Orthopedics;  Laterality: N/A;   SPINAL FUSION  10/2016   TONSILLECTOMY  1960   TOTAL KNEE ARTHROPLASTY Left 06/13/2018  Procedure: TOTAL KNEE ARTHROPLASTY;  Surgeon: Gaynelle Arabian, MD;  [facility];  Service: Orthopedics;  Laterality: Left;  Adductor Block   Patient Active Problem List   Diagnosis Date Noted   Aortic atherosclerosis ([place]) 06/05/2021   Chest pain of uncertain etiology 90/38/3338   Temperature intolerance 10/23/2019   Abnormal ECG 10/23/2019   Acute bronchitis 10/23/2019   Acute tracheitis without obstruction 10/23/2019   Allergic rhinitis 10/23/2019   Anemia, iron deficiency 10/23/2019   Anxiety disorder 10/23/2019   Arthralgia of lower leg 10/23/2019   Avitaminosis D 10/23/2019   Cannot sleep 10/23/2019   Combined fat and carbohydrate induced hyperlipemia 10/23/2019   Cough 10/23/2019   Diabetic  peripheral neuropathy associated with type 2 diabetes mellitus ([place]) 10/23/2019   Encounter for other procedures for purposes other than remedying health state 10/23/2019   Fam hx-ischem heart disease 10/23/2019   Fast heart beat 10/23/2019   Fatigue 10/23/2019   Gastric ulcer with hemorrhage and perforation but without obstruction ([place]) 10/23/2019   Gastro-esophageal reflux disease without esophagitis 10/23/2019   Generalized hyperhidrosis 10/23/2019   Hereditary and idiopathic neuropathy 10/23/2019   Muscle inflammation 10/23/2019   Other long term (current) drug therapy 10/23/2019   Other specified diseases of anus and rectum 10/23/2019   Thrombophlebitis of the femoral vein ([place]) 10/23/2019   Type 2 diabetes mellitus with hyperglycemia ([place]) 10/23/2019   Urticaria 10/23/2019   Tendonitis, Achilles, left 05/15/2019   Aftercare 08/22/2018   History of total knee replacement, left 08/22/2018   OA (osteoarthritis) of knee 06/13/2018   S/P lumbar laminectomy 03/03/2018   Sacroiliac joint pain 07/14/2017   Pain in joint of left shoulder 06/15/2017   Pain in joint of right shoulder 06/15/2017   Osteoarthritis of glenohumeral joint 06/15/2017   S/P lumbar spinal fusion 10/09/2016   DOE (dyspnea on exertion) 01/19/2014   Pure hypercholesterolemia 01/19/2014   Essential hypertension, benign 01/19/2014   Obesity, unspecified 01/19/2014    ONSET DATE: a couple months ago  REFERRING DIAG: R42 (ICD-10-CM) - Dizziness and giddiness   THERAPY DIAG:  No diagnosis found.  Rationale for Evaluation and Treatment Rehabilitation  SUBJECTIVE:   SUBJECTIVE STATEMENT: No new symptoms, notes that the frequency of episodes has decreased.  Notes main provocation comes when watching TV or reading  Pt accompanied by: self  PERTINENT HISTORY: anemia, [place] stimulator, COPD, DM, GERD, HTN, neuropathy, L RTC repair, back surgery 2018, 2019, B TKA   PAIN:  Are you having pain? Yes: NPRS scale:  7/10 Pain location: generalized Pain description:   Aggravating factors: constant Relieving factors: none  PRECAUTIONS: Fall and Other: [place] stimulator  PATIENT GOALS improve dizziness  OBJECTIVE:   TODAY'S TREATMENT: 11/20/21 Activity Comments  Brock string No symptoms other than fatigue  VOR x 1 sitting x 30 sec Horizontal/vertical--no symptoms  VOR cancellation x 30 sec Sitting--"slightly off"  optokinetics Vertical and horizontal scrolling bars--no symptoms during activity with some dizziness after 15 sec ended  Standing on foam EO 1.5 min--normal sway EC 5x10 sec--mild sway EO 5 head turns/vertical           TODAY'S TREATMENT: 11/12/21 Activity Comments  sitting VOR horizontal 2x30" No dizziness, cues for speed vs amplitude  sitting VOR vertical 30" No dizziness, no gaze instability noted  Standing VOR horizontal 30" No dizziness     Standing VOR vertical 30" in front of blinds No dizziness   Standing VOR cancellation 30" C/o difficulty tracking target but no dizziness   1/2 turns  to targets 10x and CGA Turns left/right to find playing cards on table, no symptoms  Standing on foam: EO/EC x 15 sec, head turns/vertical EO/EC, EC head tilt x 15 sec 2 rounds            Orthostatic Testing   Supine Sitting *c/o mild wooziness Standing  x1 Minute Standing x 3 Minutes  BP 118/77 110/77 138/84 NT d/t limited standing tolerance  HR 68 84 87 NT d/t limited standing tolerance     PATIENT EDUCATION: Education details: edu on results of orthostatic testing, edu on HEP update and appropriate level of dizziness with exercises Person educated: Patient Education method: Explanation, Demonstration, Tactile cues, Verbal cues, and Handouts Education comprehension: verbalized understanding and returned demonstration   Below measures were taken at time of initial evaluation unless otherwise specified:  DIAGNOSTIC FINDINGS: 09/25/21 head CT: This is a normal age-appropriate CT scan  of the head.  COGNITION: Overall cognitive status: Within functional limits for tasks assessed   SENSATION: Reports N/T in L foot d/t neuropathy    POSTURE: rounded shoulders and forward head   GAIT: Gait pattern:  significant antalgia and forward trunk d/t L hip weakness/instability Assistive device utilized: Single point cane Level of assistance: Modified independence; slow; unsteady    PATIENT SURVEYS:  FOTO [phone]   VESTIBULAR ASSESSMENT   GENERAL OBSERVATION:     OCULOMOTOR EXAM:   Ocular Alignment: normal   Ocular ROM: No Limitations   Spontaneous Nystagmus: absent   Gaze-Induced Nystagmus: absent   Smooth Pursuits: intact   Saccades: intact   Convergence/Divergence: 5 inches (L eye limited convergence- patient reports she has a "lazy eye")    VESTIBULAR - OCULAR REFLEX:    Slow VOR: Normal and Comment: horizontal and vertical   VOR Cancellation: Corrective Saccades to the L   Head-Impulse Test: HIT Right: positive HIT Left: negative    POSITIONAL TESTING:  Right Roll Test: negative; Duration: - Left Roll Test: negative; Duration: -  Right Sidelying: negative; Duration: - Left Sidelying: negative; Duration: -     M-CTSIB  Condition 1: Firm Surface, EO 30 Sec, Mild Sway  Condition 2: Firm Surface, EC 30 Sec, Moderate and Severe Sway  Condition 3: Foam Surface, EO 30 Sec, Mild Sway *unable to perform in romberg  Condition 4: Foam Surface, EC 8 Sec,  unable  Sway         VESTIBULAR TREATMENT:   PATIENT EDUCATION: Education details: prognosis, POC, HEP; edu on vestibular hypofunction  Person educated: Patient Education method: Explanation, Demonstration, and Handouts Education comprehension: verbalized understanding  Access Code: [email or web address] URL: [email or web address] Date: 10/21/2021 Prepared by: [facility]  Exercises - Seated Gaze Stabilization with Head Rotation  - 1 x daily - 5 x  weekly - 2-3 sets - 30 sec hold - Seated Gaze Stabilization with Head Nod  - 1 x daily - 5 x weekly - 2-3 sets - 30 sec hold - Romberg Stance  - 1 x daily - 5 x weekly - 2-3 sets - 30 sec hold - Standing Balance in Corner with Eyes Closed  - 1 x daily - 5 x weekly - 2-3 sets - 30 sec hold   GOALS: Goals reviewed with patient? Yes  SHORT TERM GOALS: Target date: 11/04/2021  Patient to be independent with initial HEP. Baseline: HEP initiated Goal status: MET    LONG TERM GOALS: Target date: 12/02/2021  Patient to be independent with advanced HEP. Baseline: Not  yet initiated  Goal status: MET  Patient to demonstrate mild sway with M-CTSIB condition with eyes closed/FIRM surface in order to improve safety in environments with dim lighting. Baseline: moderate sway Goal status: MET  Patient to report 60% improvement in dizziness.  Baseline: - 50% improvement Goal status: Not met    ASSESSMENT:  CLINICAL IMPRESSION:  Pt notes that the vertigo/dizziness episodes are sporadic and are not positional in nature and tend to be transient and primarily when sitting and watching TV/reading.  Pt feels confident in continuing VOR activities on her own and notes that the symptoms have been less frequent and intense.  Reports 50% improvement and feels she can continue with these activities at home and understands progression. D/C to HEP at this time per request    OBJECTIVE IMPAIRMENTS Abnormal gait, decreased balance, difficulty walking, dizziness, improper body mechanics, postural dysfunction, and pain.   ACTIVITY LIMITATIONS bending, standing, squatting, stairs, transfers, bathing, and dressing  PARTICIPATION LIMITATIONS: meal prep, cleaning, laundry, driving, community activity, and church  PERSONAL FACTORS Age, Fitness, Past/current experiences, Time since onset of injury/illness/exacerbation, and 3+ comorbidities: anemia, [place] stimulator, COPD, DM, GERD, HTN, neuropathy, L RTC repair,  back surgery 2018, 2019, B TKA  are also affecting patient's functional outcome.   REHAB POTENTIAL: Fair    CLINICAL DECISION MAKING: Unstable/unpredictable  EVALUATION COMPLEXITY: High   PLAN: PT FREQUENCY: 1x/week  PT DURATION: 6 weeks  PLANNED INTERVENTIONS: Therapeutic exercises, Therapeutic activity, Neuromuscular re-education, Balance training, Gait training, Patient/Family education, Joint mobilization, Stair training, Vestibular training, Canalith repositioning, Dry Needling, Cryotherapy, Moist heat, Taping, Manual therapy, and Re-evaluation  PLAN FOR NEXT SESSION: D/C   2:54 PM, 11/20/21 M. Sherlyn Lees, PT, DPT Physical Therapist- [place] Office Number: [phone]   [place] at [facility] [address] [place], [place] [address] Phone # [phone] Fax # [phone]

## 2021-12-10 ENCOUNTER — Ambulatory Visit (INDEPENDENT_AMBULATORY_CARE_PROVIDER_SITE_OTHER): Payer: HMO | Admitting: Physician Assistant

## 2021-12-10 ENCOUNTER — Encounter: Payer: Self-pay | Admitting: Physician Assistant

## 2021-12-10 VITALS — BP 110/60 | HR 90 | Ht 64.0 in | Wt 205.0 lb

## 2021-12-10 DIAGNOSIS — E7800 Pure hypercholesterolemia, unspecified: Secondary | ICD-10-CM | POA: Diagnosis not present

## 2021-12-10 DIAGNOSIS — R0600 Dyspnea, unspecified: Secondary | ICD-10-CM | POA: Diagnosis not present

## 2021-12-10 DIAGNOSIS — I10 Essential (primary) hypertension: Secondary | ICD-10-CM

## 2021-12-10 DIAGNOSIS — R079 Chest pain, unspecified: Secondary | ICD-10-CM

## 2021-12-10 MED ORDER — ROSUVASTATIN CALCIUM 20 MG PO TABS
20.0000 mg | ORAL_TABLET | Freq: Every evening | ORAL | 3 refills | Status: AC
Start: 2021-12-10 — End: ?

## 2021-12-10 NOTE — Patient Instructions (Addendum)
Medication Instructions:  Your physician has recommended you make the following change in your medication:  1-Increase Crestor 20 mg by mouth daily.  *If you need a refill on your cardiac medications before your next appointment, please call your pharmacy*  Lab Work: Your physician recommends that you return for lab work in: 3 months for fasting lipid panel and liver function test.   If you have labs (blood work) drawn today and your tests are completely normal, you will receive your results only by: [place] (if you have MyChart) OR A paper copy in the mail If you have any lab test that is abnormal or we need to change your treatment, we will call you to review the results.  Testing/Procedures: None ordered today.  Follow-Up: At [facility], you and your health needs are our priority.  As part of our continuing mission to provide you with exceptional heart care, we have created designated Provider Care Teams.  These Care Teams include your primary Cardiologist (physician) and Advanced Practice Providers (APPs -  Physician Assistants and Nurse Practitioners) who all work together to provide you with the care you need, when you need it.  We recommend signing up for the patient portal called "MyChart".  Sign up information is provided on this After Visit Summary.  MyChart is used to connect with patients for Virtual Visits (Telemedicine).  Patients are able to view lab/test results, encounter notes, upcoming appointments, etc.  Non-urgent messages can be sent to your provider as well.   To learn more about what you can do with MyChart, go to [email or web address].    Your next appointment:   12 month(s)  The format for your next appointment:   In Person  Provider:   [name], MD {   Important Information About Sugar

## 2021-12-10 NOTE — Progress Notes (Signed)
Cardiology Office Note    Date:  12/10/2021   ID:  Lori, [name] 12-Feb-1941, MRN [id number]   PCP:  Lori Huddle, MD   [place]  Cardiologist:  Lori Furbish, MD   Advanced Practice Provider:  No care team member to display Electrophysiologist:  None   [id number]}   Chief Complaint  Patient presents with   Follow-up    History of Present Illness:  [name] is a 81 y.o. female  with long history of chest pain and DOE with normal cardiopulmonary strest test 2017, echo, stress tests 2012, 2014, 2017, no CAD on cath 06/2021.  Still complains of chest pain with walking but can do water aerobics without any difficulty. Labs reviewed from PCP and [id number].    Past Medical History:  Diagnosis Date   Anemia    Arthritis    Battery end of life of spinal cord stimulator 2016   battery replaced.    COPD (chronic obstructive pulmonary disease) (HCC)    'Mild' per pt report   Diabetes mellitus    DVT (deep venous thrombosis) (HCC)    Dyspnea    on exertion   GERD (gastroesophageal reflux disease)    History of kidney stones    History of stomach ulcers    Hypertension    dr [name]   Neuropathy    Rapid heartbeat    Sleep apnea    borderline study; could use a CPAP if desired.     Past Surgical History:  Procedure Laterality Date   ABDOMINAL HYSTERECTOMY  1979 & 1981   ovaries and uterus removed at different times.   APPENDECTOMY     ARTHOSCOPIC ROTAOR CUFF REPAIR Left 2010 or 2011   BACK SURGERY  x 2 1999 and 2008   lower back L 4 to L 5 L5 to S 1   BREAST BIOPSY Bilateral 05/18/1997   CARPAL TUNNEL RELEASE Right 2014   CARPAL TUNNEL RELEASE Bilateral    COLONOSCOPY WITH PROPOFOL N/A 10/25/2012   Procedure: COLONOSCOPY WITH PROPOFOL;  Surgeon: [name], MD;  Location: [facility];  Service: Endoscopy;  Laterality: N/A;   EYE SURGERY     eyelid surgery   JOINT REPLACEMENT Right 2012   knee   KNEE ARTHROSCOPY Right  2004   LUMBAR LAMINECTOMY/DECOMPRESSION MICRODISCECTOMY Bilateral 03/03/2018   Procedure: Laminectomy and Foraminotomy - Lumbar one-Lumbar two - bilateral Posterior lateral fusion;  Surgeon: [name], MD;  Location: [place];  Service: Neurosurgery;  Laterality: Bilateral;   ovarian wedge resection  1966   RIGHT/LEFT HEART CATH AND CORONARY ANGIOGRAPHY N/A 06/11/2021   Procedure: RIGHT/LEFT HEART CATH AND CORONARY ANGIOGRAPHY;  Surgeon: Jettie Booze, MD;  Location: [place] [facility];  Service: Cardiovascular;  Laterality: N/A;   SPINAL CORD STIMULATOR INSERTION N/A 11/03/2012   Procedure: LUMBAR SPINAL CORD STIMULATOR INSERTION;  Surgeon: [name], MD;  Location: [place];  Service: Orthopedics;  Laterality: N/A;   SPINAL FUSION  10/2016   TONSILLECTOMY  1960   TOTAL KNEE ARTHROPLASTY Left 06/13/2018   Procedure: TOTAL KNEE ARTHROPLASTY;  Surgeon: Gaynelle Arabian, MD;  Location: [facility];  Service: Orthopedics;  Laterality: Left;  Adductor Block    Current Medications: Current Meds  Medication Sig   Biotin 5000 MCG TABS Take 5,000 mcg by mouth daily.   calcium carbonate (OSCAL) 1500 (600 Ca) MG TABS tablet Take 1 tablet by mouth daily.   co-enzyme Q-10 30 MG capsule Take 100  mg by mouth 2 (two) times daily.   estradiol (ESTRACE) 1 MG tablet Take 1 mg by mouth every evening.    fexofenadine (ALLEGRA) 180 MG tablet Take 180 mg by mouth daily as needed for allergies or rhinitis.   fluticasone furoate-vilanterol (BREO ELLIPTA) 100-25 MCG/INH AEPB Inhale 1 puff into the lungs daily.   furosemide (LASIX) 20 MG tablet Take 20 mg by mouth as needed for fluid.   glipiZIDE (GLUCOTROL) 10 MG tablet Take 10 mg by mouth daily.   HYDROcodone-acetaminophen (NORCO) 7.5-325 MG tablet Take 1 tablet by mouth every 4 (four) hours as needed for moderate pain.   insulin glargine, 2 Unit Dial, (TOUJEO MAX SOLOSTAR) 300 UNIT/ML Solostar Pen Inject 60 Units into the skin daily.   losartan (COZAAR) 100 MG  tablet Take 100 mg by mouth every evening.    magnesium 30 MG tablet Take 30 mg by mouth 2 (two) times daily.   metFORMIN (GLUCOPHAGE) 1000 MG tablet Take 1 tablet (1,000 mg total) by mouth 2 (two) times daily.   metoprolol succinate (TOPROL-XL) 50 MG 24 hr tablet Take 1 tablet (50 mg total) by mouth 2 (two) times daily.   Omega-3 Fatty Acids (FISH OIL) 1200 MG CAPS Take 1,200 mg by mouth 2 (two) times daily.   omeprazole (PRILOSEC) 40 MG capsule Take 40 mg by mouth as needed (Heartburn).   vitamin B-12 (CYANOCOBALAMIN) 500 MCG tablet Take 1,000 mcg by mouth daily.   [DISCONTINUED] rosuvastatin (CRESTOR) 10 MG tablet Take 10 mg by mouth every evening.      Allergies:   Hydrochlorothiazide; Ace inhibitors; Ezetimibe-simvastatin; Indomethacin; Lisinopril; Lyrica [pregabalin]; Prednisone; Simvastatin; Vioxx [rofecoxib]; Troglitazone; and Antihistamines, chlorpheniramine-type   Social History   Socioeconomic History   Marital status: Married    Spouse name: Not on file   Number of children: Not on file   Years of education: Not on file   Highest education level: Not on file  Occupational History   Not on file  Tobacco Use   Smoking status: Never   Smokeless tobacco: Never  Vaping Use   Vaping Use: Never used  Substance and Sexual Activity   Alcohol use: No   Drug use: No   Sexual activity: Not on file  Other Topics Concern   Not on file  Social History Narrative   Not on file   Social Determinants of Health   Financial Resource Strain: Not on file  Food Insecurity: Not on file  Transportation Needs: No Transportation Needs (08/23/2019)   PRAPARE - Hydrologist (Medical): No    Lack of Transportation (Non-Medical): No  Physical Activity: Not on file  Stress: Not on file  Social Connections: Not on file     Family History:  The patient's  family history includes Breast cancer (age of onset: 67) in her daughter; Diabetes in her mother; Heart  disease in her father.   ROS:   Please see the history of present illness.    ROS All other systems reviewed and are negative.   PHYSICAL EXAM:   VS:  BP 110/60 (BP Location: Left Arm, Patient Position: Sitting, Cuff Size: Normal)   Pulse 90   Ht '5\' 4"'$  (1.626 m)   Wt 205 lb (93 kg)   LMP  (LMP Unknown)   SpO2 94%   BMI 35.19 kg/m   Physical Exam  GEN: Obese, in no acute distress  Neck: no JVD, carotid bruits, or masses Cardiac:RRR; no murmurs, rubs, or gallops  Respiratory:  clear to auscultation bilaterally, normal work of breathing GI: soft, nontender, nondistended, + BS Ext: without cyanosis, clubbing, or edema, Good distal pulses bilaterally Neuro:  Alert and Oriented x 3,  Psych: euthymic mood, full affect  Wt Readings from Last 3 Encounters:  12/10/21 205 lb (93 kg)  09/19/21 204 lb (92.5 kg)  06/11/21 205 lb (93 kg)       Studies/Labs Reviewed:   EKG:  EKG is not ordered today.   Recent Labs: 06/05/2021: BUN 30; Creatinine, Ser 1.09; Platelets 260 06/11/2021: Hemoglobin 12.2; Potassium 4.3; Sodium 141   Lipid Panel No results found for: "CHOL", "TRIG", "HDL", "CHOLHDL", "VLDL", "LDLCALC", "LDLDIRECT"  Additional studies/ records that were reviewed today include:    LHC 05/17/77   LV end diastolic pressure is normal.   There is no aortic valve stenosis.   No angiographically apparent coronary artery disease.   Aortic saturation 95%, PA saturation 68%, PA pressure 32/14, mean PA pressure 21 mmHg, mean PCWP 10 mmHg, cardiac output 5.75 L/min, cardiac index 2.94.   No significant CAD.  Normal right heart pressures.  Dye exposure minimize due to mildly decreased GFR.   Echo 06/06/21 IMPRESSIONS     1. Left ventricular ejection fraction, by estimation, is 60 to 65%. The  left ventricle has normal function. The left ventricle has no regional  wall motion abnormalities. There is moderate left ventricular hypertrophy.  Left ventricular diastolic  parameters are  consistent with Grade I diastolic dysfunction (impaired  relaxation).   2. Right ventricular systolic function is normal. The right ventricular  size is normal.   3. Left atrial size was mildly dilated.   4. The mitral valve is abnormal. Trivial mitral valve regurgitation. No  evidence of mitral stenosis.   5. Calcified non coronary cusp. The aortic valve is tricuspid. There is  moderate calcification of the aortic valve. There is moderate thickening  of the aortic valve. Aortic valve regurgitation is not visualized. Aortic  valve sclerosis/calcification is  present, without any evidence of aortic stenosis.   6. The inferior vena cava is normal in size with greater than 50%  respiratory variability, suggesting right atrial pressure of 3 mmHg.   Bilateral LE Dopplers 01/04/2019: FINDINGS: Right Lower Extremity   Resting ABI:  1.1   Resting TBI: 0.7   Segmental Pressures: Normal segmental pressures, no significant (20 mmHg) pressure gradient between adjacent segments. Great toe pressure: 89 mm Hg   Arterial Waveforms: Normal tri-phasic arterial waveforms.   PVRs: Normal PVRs with maintained waveform amplitude, augmentation and quality.   Left Lower Extremity:   Resting ABI: 1.2   Resting TBI: 0.7   Segmental Pressures: Normal segmental pressures, no significant (20 mmHg) pressure gradient between adjacent segments. Great toe pressure: 90 mm Hg   Arterial Waveforms: Normal tri-phasic arterial waveforms.   PVRs: Normal PVRs with maintained waveform amplitude, augmentation and quality.   Other: Symmetric upper extremity pressures.   Ankle Brachial index   > 1.4 Non diagnostic secondary to incompressible vessel calcifications   1.0-1.4       Normal   0.9-0.99     Borderline PAD   0.8-0.89     Mild PAD   0.5-0.79     Moderate PAD   < 0.5          Severe PAD   Toe Brachial Index   Normal     >0.65   Moderate  0.53-0.64   Severe     <0.23   Toe  Pressures   Absolute toe pressure >11mHg sufficient for wound healing.   Toe pressures <535mg = critical limb ischemia.   IMPRESSION: Normal examination. No evidence of hemodynamically significant peripheral arterial disease.   Nuclear Stress 04/01/2016: Nuclear stress EF: 76%. There was no ST segment deviation noted during stress. The study is normal. This is a low risk study. The left ventricular ejection fraction is hyperdynamic (>65%).   Cardiopulmonary Exercise Test 07/11/2015: Interpretation   Notes: Patient gave a very good effort. Pulse-oximetry remained  98% or above throughout exercise. Exercise was performed on a  cycle ergometer starting at [facility] increasing by 10W/min.   ECG:  Resting ECG in sinus tachycardia with occasional PACs. The  HR response was adequate. There were no sustained arrhythmias and  no ST-T changes throughout the exercise. BP response was  hypertensive.   PFT:  Pre-exercise spirometry suggests possible mild restriction.  MVV normal.   CPX:  The RER of 1.28 indicates a maximal effort. The peak VO2 is  normal at 13.6 ml/kg/min (101% of the age/gender/weight matched  sedentary norm). When adjusted to the patients ideal body weight  of 142.2 lb (64.5 kg) the peak VO2 is 20.4 ml/kg (ibw)/min (118%  of the IBW adjusted predicted). The VO2 at the ventilatory  threshold is normal at 68.7% of the predicted peak VO2. At peak  exercise the ventilation was 57.6% of the measured MVV indicating  ventilatory reserve remained. As mentioned above there was an  adequate HR response to the exercise with a HR reserve of 8 bpm.  The O2pulse (a surrogate for stroke volume) increased with  exercise itensity reaching 9 ml/beat (100% of predicted). The  VE/VCO2 slope is normal. The oxygen uptake efficiency slope  (OUES) is normal and reflects the patient's measured functional  capacity.    Conclusion: Exercise testing with gas exchange demonstrates  normal  functional capacity when compared to matched sedentary  norms. There are no obvious ventilatory or circulatory  limitations to exercise, however there was a hypertensive  response to the exercise. At peak exercise, patient appears  primarily limited by her body habitus.     Risk Assessment/Calculations:         ASSESSMENT:    1. Chest pain, unspecified type   2. Pure hypercholesterolemia   3. Essential hypertension   4. Dyspnea, unspecified type      PLAN:  In order of problems listed above:  History of chest pain, no CAD on cath 06/2021   HTN BP well controlled  HLD LDL 132 10/2021 on crestor 10 mg. Recommend 20 mg daily and repeat FLP & LFT's in 3 months.  Dyspnea extensive work up including CPX    Shared Decision Making/Informed Consent        Medication Adjustments/Labs and Tests Ordered: Current medicines are reviewed at length with the patient today.  Concerns regarding medicines are outlined above.  Medication changes, Labs and Tests ordered today are listed in the Patient Instructions below. Patient Instructions  Medication Instructions:  Your physician has recommended you make the following change in your medication:  1-Increase Crestor 20 mg by mouth daily.  *If you need a refill on your cardiac medications before your next appointment, please call your pharmacy*  Lab Work: Your physician recommends that you return for lab work in: 3 months for fasting lipid panel and liver function test.   If you have labs (blood work) drawn today and your tests are completely normal, you will receive your results only  by: MyChart Message (if you have MyChart) OR A paper copy in the mail If you have any lab test that is abnormal or we need to change your treatment, we will call you to review the results.  Testing/Procedures: None ordered today.  Follow-Up: At [facility], you and your health needs are our priority.  As part of our continuing mission to provide  you with exceptional heart care, we have created designated Provider Care Teams.  These Care Teams include your primary Cardiologist (physician) and Advanced Practice Providers (APPs -  Physician Assistants and Nurse Practitioners) who all work together to provide you with the care you need, when you need it.  We recommend signing up for the patient portal called "MyChart".  Sign up information is provided on this After Visit Summary.  MyChart is used to connect with patients for Virtual Visits (Telemedicine).  Patients are able to view lab/test results, encounter notes, upcoming appointments, etc.  Non-urgent messages can be sent to your provider as well.   To learn more about what you can do with MyChart, go to [email or web address].    Your next appointment:   12 month(s)  The format for your next appointment:   In Person  Provider:   [name], MD {   Important Information About Sugar         Signed, Ermalinda Barrios, PA-C  12/10/2021 1:55 PM    [address], [place], [place]  [id number] Phone: [phone]; Fax: [phone]

## 2022-01-01 DIAGNOSIS — J449 Chronic obstructive pulmonary disease, unspecified: Secondary | ICD-10-CM | POA: Diagnosis not present

## 2022-01-01 DIAGNOSIS — M1990 Unspecified osteoarthritis, unspecified site: Secondary | ICD-10-CM | POA: Diagnosis not present

## 2022-01-01 DIAGNOSIS — N183 Chronic kidney disease, stage 3 unspecified: Secondary | ICD-10-CM | POA: Diagnosis not present

## 2022-01-01 DIAGNOSIS — K219 Gastro-esophageal reflux disease without esophagitis: Secondary | ICD-10-CM | POA: Diagnosis not present

## 2022-01-01 DIAGNOSIS — I10 Essential (primary) hypertension: Secondary | ICD-10-CM | POA: Diagnosis not present

## 2022-01-01 DIAGNOSIS — E1140 Type 2 diabetes mellitus with diabetic neuropathy, unspecified: Secondary | ICD-10-CM | POA: Diagnosis not present

## 2022-01-01 DIAGNOSIS — E782 Mixed hyperlipidemia: Secondary | ICD-10-CM | POA: Diagnosis not present

## 2022-01-08 DIAGNOSIS — J449 Chronic obstructive pulmonary disease, unspecified: Secondary | ICD-10-CM | POA: Diagnosis not present

## 2022-01-08 DIAGNOSIS — I10 Essential (primary) hypertension: Secondary | ICD-10-CM | POA: Diagnosis not present

## 2022-01-08 DIAGNOSIS — J069 Acute upper respiratory infection, unspecified: Secondary | ICD-10-CM | POA: Diagnosis not present

## 2022-01-29 DIAGNOSIS — J449 Chronic obstructive pulmonary disease, unspecified: Secondary | ICD-10-CM | POA: Diagnosis not present

## 2022-01-29 DIAGNOSIS — Z23 Encounter for immunization: Secondary | ICD-10-CM | POA: Diagnosis not present

## 2022-01-29 DIAGNOSIS — E1165 Type 2 diabetes mellitus with hyperglycemia: Secondary | ICD-10-CM | POA: Diagnosis not present

## 2022-01-29 DIAGNOSIS — I700 Atherosclerosis of aorta: Secondary | ICD-10-CM | POA: Diagnosis not present

## 2022-01-29 DIAGNOSIS — J069 Acute upper respiratory infection, unspecified: Secondary | ICD-10-CM | POA: Diagnosis not present

## 2022-01-29 DIAGNOSIS — I10 Essential (primary) hypertension: Secondary | ICD-10-CM | POA: Diagnosis not present

## 2022-03-12 ENCOUNTER — Ambulatory Visit: Payer: HMO | Attending: Physician Assistant

## 2022-03-12 DIAGNOSIS — E7800 Pure hypercholesterolemia, unspecified: Secondary | ICD-10-CM

## 2022-03-12 DIAGNOSIS — R079 Chest pain, unspecified: Secondary | ICD-10-CM

## 2022-03-12 LAB — HEPATIC FUNCTION PANEL
ALT: 12 IU/L (ref 0–32)
AST: 12 IU/L (ref 0–40)
Albumin: 4.2 g/dL (ref 3.8–4.8)
Alkaline Phosphatase: 56 IU/L (ref 44–121)
Bilirubin Total: 0.3 mg/dL (ref 0.0–1.2)
Bilirubin, Direct: 0.1 mg/dL (ref 0.00–0.40)
Total Protein: 6.5 g/dL (ref 6.0–8.5)

## 2022-03-12 LAB — LIPID PANEL
Chol/HDL Ratio: 2.6 ratio (ref 0.0–4.4)
Cholesterol, Total: 192 mg/dL (ref 100–199)
HDL: 73 mg/dL (ref >39)
LDL Chol Calc (NIH): 95 mg/dL (ref 0–99)
Triglycerides: 139 mg/dL (ref 0–149)
VLDL Cholesterol Cal: 24 mg/dL (ref 5–40)

## 2022-05-15 DIAGNOSIS — E119 Type 2 diabetes mellitus without complications: Secondary | ICD-10-CM | POA: Diagnosis not present

## 2022-05-15 DIAGNOSIS — I10 Essential (primary) hypertension: Secondary | ICD-10-CM | POA: Diagnosis not present

## 2022-05-15 DIAGNOSIS — F419 Anxiety disorder, unspecified: Secondary | ICD-10-CM | POA: Diagnosis not present

## 2022-05-15 DIAGNOSIS — Z86718 Personal history of other venous thrombosis and embolism: Secondary | ICD-10-CM | POA: Diagnosis not present

## 2022-05-15 DIAGNOSIS — J449 Chronic obstructive pulmonary disease, unspecified: Secondary | ICD-10-CM | POA: Diagnosis not present

## 2022-05-15 DIAGNOSIS — E669 Obesity, unspecified: Secondary | ICD-10-CM | POA: Diagnosis not present

## 2022-05-15 DIAGNOSIS — Z79899 Other long term (current) drug therapy: Secondary | ICD-10-CM | POA: Diagnosis not present

## 2022-05-15 DIAGNOSIS — K219 Gastro-esophageal reflux disease without esophagitis: Secondary | ICD-10-CM | POA: Diagnosis not present

## 2022-05-15 DIAGNOSIS — M461 Sacroiliitis, not elsewhere classified: Secondary | ICD-10-CM | POA: Diagnosis not present

## 2022-05-15 DIAGNOSIS — Z7984 Long term (current) use of oral hypoglycemic drugs: Secondary | ICD-10-CM | POA: Diagnosis not present

## 2022-05-15 DIAGNOSIS — Z6836 Body mass index (BMI) 36.0-36.9, adult: Secondary | ICD-10-CM | POA: Diagnosis not present

## 2022-06-09 DIAGNOSIS — I10 Essential (primary) hypertension: Secondary | ICD-10-CM | POA: Diagnosis not present

## 2022-06-09 DIAGNOSIS — E1140 Type 2 diabetes mellitus with diabetic neuropathy, unspecified: Secondary | ICD-10-CM | POA: Diagnosis not present

## 2022-06-09 DIAGNOSIS — N183 Chronic kidney disease, stage 3 unspecified: Secondary | ICD-10-CM | POA: Diagnosis not present

## 2022-06-09 DIAGNOSIS — E1122 Type 2 diabetes mellitus with diabetic chronic kidney disease: Secondary | ICD-10-CM | POA: Diagnosis not present

## 2022-06-24 ENCOUNTER — Encounter: Payer: Self-pay | Admitting: Podiatry

## 2022-06-24 ENCOUNTER — Ambulatory Visit (INDEPENDENT_AMBULATORY_CARE_PROVIDER_SITE_OTHER): Payer: HMO | Admitting: Podiatry

## 2022-06-24 DIAGNOSIS — M2041 Other hammer toe(s) (acquired), right foot: Secondary | ICD-10-CM

## 2022-06-24 DIAGNOSIS — E1149 Type 2 diabetes mellitus with other diabetic neurological complication: Secondary | ICD-10-CM | POA: Diagnosis not present

## 2022-06-24 DIAGNOSIS — E1140 Type 2 diabetes mellitus with diabetic neuropathy, unspecified: Secondary | ICD-10-CM

## 2022-06-24 NOTE — Progress Notes (Signed)
Subjective:   Patient ID: [name], female   DOB: 82 y.o.   MRN: [id number]   HPI Patient presents long-term diabetic neuropathy with digital deformities history of diabetic shoes which help help to prevent ulcerations with slight redness on top of the second digit right   ROS      Objective:  Physical Exam  Vascular mildly reduced neurological reduced sharp dull vibratory bilateral digital deformity long-term history of diabetes moderate reduction range of motion subtalar midtarsal joint     Assessment:  Chronic discomfort in both feet long-term diabetic neuropathy digital deformity     Plan:  Recommended diabetic shoes in order to accommodate deformity explained all this to her and she is fitted for diabetic shoes which we will get to her when approved.  All questions answered today

## 2022-07-03 DIAGNOSIS — K219 Gastro-esophageal reflux disease without esophagitis: Secondary | ICD-10-CM | POA: Diagnosis not present

## 2022-07-03 DIAGNOSIS — N183 Chronic kidney disease, stage 3 unspecified: Secondary | ICD-10-CM | POA: Diagnosis not present

## 2022-07-03 DIAGNOSIS — M1990 Unspecified osteoarthritis, unspecified site: Secondary | ICD-10-CM | POA: Diagnosis not present

## 2022-07-03 DIAGNOSIS — J449 Chronic obstructive pulmonary disease, unspecified: Secondary | ICD-10-CM | POA: Diagnosis not present

## 2022-07-03 DIAGNOSIS — I10 Essential (primary) hypertension: Secondary | ICD-10-CM | POA: Diagnosis not present

## 2022-07-03 DIAGNOSIS — E782 Mixed hyperlipidemia: Secondary | ICD-10-CM | POA: Diagnosis not present

## 2022-07-03 DIAGNOSIS — E1140 Type 2 diabetes mellitus with diabetic neuropathy, unspecified: Secondary | ICD-10-CM | POA: Diagnosis not present

## 2022-07-15 ENCOUNTER — Telehealth: Payer: Self-pay | Admitting: Podiatry

## 2022-07-15 DIAGNOSIS — M47816 Spondylosis without myelopathy or radiculopathy, lumbar region: Secondary | ICD-10-CM | POA: Diagnosis not present

## 2022-07-15 DIAGNOSIS — Z981 Arthrodesis status: Secondary | ICD-10-CM | POA: Diagnosis not present

## 2022-07-15 NOTE — Telephone Encounter (Signed)
Faxed prior authorization to [facility] for diabetic shoes

## 2022-07-22 DIAGNOSIS — M48061 Spinal stenosis, lumbar region without neurogenic claudication: Secondary | ICD-10-CM | POA: Diagnosis not present

## 2022-07-22 DIAGNOSIS — J449 Chronic obstructive pulmonary disease, unspecified: Secondary | ICD-10-CM | POA: Diagnosis not present

## 2022-07-22 DIAGNOSIS — N183 Chronic kidney disease, stage 3 unspecified: Secondary | ICD-10-CM | POA: Diagnosis not present

## 2022-07-22 DIAGNOSIS — Z7409 Other reduced mobility: Secondary | ICD-10-CM | POA: Diagnosis not present

## 2022-07-22 DIAGNOSIS — Z794 Long term (current) use of insulin: Secondary | ICD-10-CM | POA: Diagnosis not present

## 2022-07-22 DIAGNOSIS — I700 Atherosclerosis of aorta: Secondary | ICD-10-CM | POA: Diagnosis not present

## 2022-07-22 DIAGNOSIS — E1140 Type 2 diabetes mellitus with diabetic neuropathy, unspecified: Secondary | ICD-10-CM | POA: Diagnosis not present

## 2022-08-04 NOTE — Telephone Encounter (Signed)
Lm w spouse to call back to set up appt for diabetic shoe pick up

## 2022-08-14 DIAGNOSIS — M549 Dorsalgia, unspecified: Secondary | ICD-10-CM | POA: Diagnosis not present

## 2022-08-17 ENCOUNTER — Ambulatory Visit (INDEPENDENT_AMBULATORY_CARE_PROVIDER_SITE_OTHER): Payer: PPO | Admitting: Podiatry

## 2022-08-17 DIAGNOSIS — M2041 Other hammer toe(s) (acquired), right foot: Secondary | ICD-10-CM | POA: Diagnosis not present

## 2022-08-17 DIAGNOSIS — E1140 Type 2 diabetes mellitus with diabetic neuropathy, unspecified: Secondary | ICD-10-CM | POA: Diagnosis not present

## 2022-08-17 DIAGNOSIS — E1149 Type 2 diabetes mellitus with other diabetic neurological complication: Secondary | ICD-10-CM | POA: Diagnosis not present

## 2022-08-17 NOTE — Progress Notes (Signed)
Patient presents today to pick up diabetic shoes and insoles.  Patient was dispensed 1 pair of diabetic shoes and 3 pairs of foam casted diabetic insoles. She tried on the shoes with the insoles and the fit was satisfactory.   Will follow up next year for new order.    

## 2022-08-18 DIAGNOSIS — G894 Chronic pain syndrome: Secondary | ICD-10-CM | POA: Diagnosis not present

## 2022-08-18 DIAGNOSIS — M545 Low back pain, unspecified: Secondary | ICD-10-CM | POA: Diagnosis not present

## 2022-08-24 ENCOUNTER — Telehealth: Payer: Self-pay | Admitting: Podiatry

## 2022-08-24 NOTE — Telephone Encounter (Signed)
Patient called her shoes are too small, she will drop them off on 4/18. She wants to order the same one she had before the [place] 851 10xw.  Order has been placed , patient schedule to come in 5/2 to try them on.

## 2022-09-08 ENCOUNTER — Ambulatory Visit (INDEPENDENT_AMBULATORY_CARE_PROVIDER_SITE_OTHER): Payer: PPO | Admitting: Podiatry

## 2022-09-08 VITALS — BP 134/72

## 2022-09-08 DIAGNOSIS — E1140 Type 2 diabetes mellitus with diabetic neuropathy, unspecified: Secondary | ICD-10-CM

## 2022-09-08 DIAGNOSIS — M2041 Other hammer toe(s) (acquired), right foot: Secondary | ICD-10-CM

## 2022-09-08 DIAGNOSIS — E119 Type 2 diabetes mellitus without complications: Secondary | ICD-10-CM

## 2022-09-08 DIAGNOSIS — E1149 Type 2 diabetes mellitus with other diabetic neurological complication: Secondary | ICD-10-CM

## 2022-09-08 DIAGNOSIS — Z0189 Encounter for other specified special examinations: Secondary | ICD-10-CM

## 2022-09-08 NOTE — Patient Instructions (Signed)
Purchase Nervive Pain Cream or Roll On. Apply to feet before bedtime. Can be purchased at your local drug store over the counter. 

## 2022-09-10 ENCOUNTER — Other Ambulatory Visit: Payer: PPO

## 2022-09-10 ENCOUNTER — Encounter: Payer: Self-pay | Admitting: Podiatry

## 2022-09-10 NOTE — Progress Notes (Signed)
ANNUAL DIABETIC FOOT EXAM  Subjective: [name] presents today annual diabetic foot exam.  Chief Complaint  Patient presents with   Nail Problem    DFC/Foot Exam BS-110 A1C-7.1 PCP-Raju PCP VST- Has not met yet    Patient confirms h/o diabetes.  Patient denies any h/o foot wounds.  Patient has been diagnosed with neuropathy.  Risk factors: diabetes, diabetic neuropathy, h/o DVT, HTN, hypercholesterolemia.  Lori Ates, MD is patient's PCP.  Past Medical History:  Diagnosis Date   Anemia    Arthritis    Battery end of life of spinal cord stimulator 2016   battery replaced.    COPD (chronic obstructive pulmonary disease) (HCC)    'Mild' per pt report   Diabetes mellitus    DVT (deep venous thrombosis) (HCC)    Dyspnea    on exertion   GERD (gastroesophageal reflux disease)    History of kidney stones    History of stomach ulcers    Hypertension    dr [name]   Neuropathy    Rapid heartbeat    Sleep apnea    borderline study; could use a CPAP if desired.    Patient Active Problem List   Diagnosis Date Noted   Aortic atherosclerosis (HCC) 06/05/2021   Chest pain of uncertain etiology 06/05/2021   Temperature intolerance 10/23/2019   Abnormal ECG 10/23/2019   Acute bronchitis 10/23/2019   Acute tracheitis without obstruction 10/23/2019   Allergic rhinitis 10/23/2019   Anemia, iron deficiency 10/23/2019   Anxiety disorder 10/23/2019   Arthralgia of lower leg 10/23/2019   Avitaminosis D 10/23/2019   Cannot sleep 10/23/2019   Combined fat and carbohydrate induced hyperlipemia 10/23/2019   Cough 10/23/2019   Diabetic peripheral neuropathy associated with type 2 diabetes mellitus (HCC) 10/23/2019   Encounter for other procedures for purposes other than remedying health state 10/23/2019   Fam hx-ischem heart disease 10/23/2019   Fast heart beat 10/23/2019   Fatigue 10/23/2019   Gastric ulcer with hemorrhage and perforation but without obstruction  (HCC) 10/23/2019   Gastro-esophageal reflux disease without esophagitis 10/23/2019   Generalized hyperhidrosis 10/23/2019   Hereditary and idiopathic neuropathy 10/23/2019   Muscle inflammation 10/23/2019   Other long term (current) drug therapy 10/23/2019   Other specified diseases of anus and rectum 10/23/2019   Thrombophlebitis of the femoral vein (HCC) 10/23/2019   Type 2 diabetes mellitus with hyperglycemia (HCC) 10/23/2019   Urticaria 10/23/2019   Tendonitis, Achilles, left 05/15/2019   Aftercare 08/22/2018   History of total knee replacement, left 08/22/2018   OA (osteoarthritis) of knee 06/13/2018   S/P lumbar laminectomy 03/03/2018   Sacroiliac joint pain 07/14/2017   Pain in joint of left shoulder 06/15/2017   Pain in joint of right shoulder 06/15/2017   Osteoarthritis of glenohumeral joint 06/15/2017   S/P lumbar spinal fusion 10/09/2016   DOE (dyspnea on exertion) 01/19/2014   Pure hypercholesterolemia 01/19/2014   Essential hypertension, benign 01/19/2014   Obesity, unspecified 01/19/2014   Past Surgical History:  Procedure Laterality Date   ABDOMINAL HYSTERECTOMY  1979 & 1981   ovaries and uterus removed at different times.   APPENDECTOMY     ARTHOSCOPIC ROTAOR CUFF REPAIR Left 2010 or 2011   BACK SURGERY  x 2 1999 and 2008   lower back L 4 to L 5 L5 to S 1   BREAST BIOPSY Bilateral 05/18/1997   CARPAL TUNNEL RELEASE Right 2014   CARPAL TUNNEL RELEASE Bilateral    COLONOSCOPY WITH PROPOFOL  N/A 10/25/2012   Procedure: COLONOSCOPY WITH PROPOFOL;  Surgeon: Charolett Bumpers, MD;  Location: [facility];  Service: Endoscopy;  Laterality: N/A;   EYE SURGERY     eyelid surgery   JOINT REPLACEMENT Right 2012   knee   KNEE ARTHROSCOPY Right 2004   LUMBAR LAMINECTOMY/DECOMPRESSION MICRODISCECTOMY Bilateral 03/03/2018   Procedure: Laminectomy and Foraminotomy - Lumbar one-Lumbar two - bilateral Posterior lateral fusion;  Surgeon: Lori Alert, MD;  Location: [facility];   Service: Neurosurgery;  Laterality: Bilateral;   ovarian wedge resection  1966   RIGHT/LEFT HEART CATH AND CORONARY ANGIOGRAPHY N/A 06/11/2021   Procedure: RIGHT/LEFT HEART CATH AND CORONARY ANGIOGRAPHY;  Surgeon: Lori Crafts, MD;  Location: [facility] INVASIVE CV LAB;  Service: Cardiovascular;  Laterality: N/A;   SPINAL CORD STIMULATOR INSERTION N/A 11/03/2012   Procedure: LUMBAR SPINAL CORD STIMULATOR INSERTION;  Surgeon: Lori Lick, MD;  Location: [facility] OR;  Service: Orthopedics;  Laterality: N/A;   SPINAL FUSION  10/2016   TONSILLECTOMY  1960   TOTAL KNEE ARTHROPLASTY Left 06/13/2018   Procedure: TOTAL KNEE ARTHROPLASTY;  Surgeon: Ollen Gross, MD;  Location: [facility] ORS;  Service: Orthopedics;  Laterality: Left;  Adductor Block   Current Outpatient Medications on File Prior to Visit  Medication Sig Dispense Refill   Biotin 5000 MCG TABS Take 5,000 mcg by mouth daily.     calcium carbonate (OSCAL) 1500 (600 Ca) MG TABS tablet Take 1 tablet by mouth daily.     co-enzyme Q-10 30 MG capsule Take 100 mg by mouth 2 (two) times daily.     estradiol (ESTRACE) 1 MG tablet Take 1 mg by mouth every evening.      fexofenadine (ALLEGRA) 180 MG tablet Take 180 mg by mouth daily as needed for allergies or rhinitis.     fluticasone furoate-vilanterol (BREO ELLIPTA) 100-25 MCG/INH AEPB Inhale 1 puff into the lungs daily.     furosemide (LASIX) 20 MG tablet Take 20 mg by mouth as needed for fluid.  3   glipiZIDE (GLUCOTROL) 10 MG tablet Take 10 mg by mouth daily.     HYDROcodone-acetaminophen (NORCO) 7.5-325 MG tablet Take 1 tablet by mouth every 4 (four) hours as needed for moderate pain.     insulin glargine, 2 Unit Dial, (TOUJEO MAX SOLOSTAR) 300 UNIT/ML Solostar Pen Inject 60 Units into the skin daily.     losartan (COZAAR) 100 MG tablet Take 100 mg by mouth every evening.      magnesium 30 MG tablet Take 30 mg by mouth 2 (two) times daily.     metFORMIN (GLUCOPHAGE) 1000 MG tablet Take 1 tablet (1,000 mg  total) by mouth 2 (two) times daily.     metoprolol succinate (TOPROL-XL) 50 MG 24 hr tablet Take 1 tablet (50 mg total) by mouth 2 (two) times daily. 180 tablet 3   Omega-3 Fatty Acids (FISH OIL) 1200 MG CAPS Take 1,200 mg by mouth 2 (two) times daily.     omeprazole (PRILOSEC) 40 MG capsule Take 40 mg by mouth as needed (Heartburn).     rosuvastatin (CRESTOR) 20 MG tablet Take 1 tablet (20 mg total) by mouth every evening. 90 tablet 3   vitamin B-12 (CYANOCOBALAMIN) 500 MCG tablet Take 1,000 mcg by mouth daily.     No current facility-administered medications on file prior to visit.    Allergies  Allergen Reactions   Hydrochlorothiazide Anaphylaxis and Other (See Comments)    comatose   Ace Inhibitors Cough   Ezetimibe-Simvastatin Other (  See Comments)    MYALGIA   Indomethacin Other (See Comments)    PEPTIC ULCERS   Lisinopril Cough   Lyrica [Pregabalin] Other (See Comments)    Weight gain, drowsy   Prednisone Itching, Swelling and Other (See Comments)    Facial redness/swelling    Simvastatin Other (See Comments)    Elevated LFT's   Vioxx [Rofecoxib] Other (See Comments)    Anxiety & chills   Troglitazone Other (See Comments)    Pt is unaware of reaction.   Antihistamines, Chlorpheniramine-Type Other (See Comments)    "HYPER"   Social History   Occupational History   Not on file  Tobacco Use   Smoking status: Never   Smokeless tobacco: Never  Vaping Use   Vaping Use: Never used  Substance and Sexual Activity   Alcohol use: No   Drug use: No   Sexual activity: Not on file   Family History  Problem Relation Age of Onset   Heart disease Father    Diabetes Mother    Breast cancer Daughter 28   Immunization History  Administered Date(s) Administered   Hep A / Hep B 07/04/2008, 08/01/2008, 12/24/2008   Influenza Split 02/25/2009, 02/04/2011, 02/12/2012, 03/02/2013, 04/09/2014, 02/28/2015   Influenza, High Dose Seasonal PF 01/07/2017, 01/16/2018   Influenza,  Seasonal, Injecte, Preservative Fre 03/11/2015   Influenza,inj,quad, With Preservative 01/16/2018   Pneumococcal Conjugate-13 07/10/2014   Pneumococcal Polysaccharide-23 11/13/2000, 08/05/2011   Tdap 07/01/2012   Zoster Recombinat (Shingrix) 06/25/2017   Zoster, Live 11/13/2004     Review of Systems: Negative except as noted in the HPI.   Objective: Vitals:   09/08/22 1034  BP: 134/72    ELLAINA SCHULER is a pleasant 82 y.o. female in NAD. AAO X 3.  Vascular Examination: CFT <3 seconds b/l LE. Pedal hair absent. No pain with calf compression b/l. Lower extremity skin temperature gradient within normal limits. Trace edema noted BLE. No cyanosis or clubbing noted b/l LE.  Dermatological Examination: Pedal skin is warm and supple b/l LE. No open wounds b/l LE. No interdigital macerations noted b/l LE. Toenails 1-5 b/l well maintained with adequate length. No erythema, no edema, no drainage, no fluctuance. No hyperkeratotic nor porokeratotic lesions present on today's visit.  Neurological Examination: Pt has subjective symptoms of neuropathy. Protective sensation intact 5/5 intact bilaterally with 10g monofilament b/l. Vibratory sensation intact b/l.  Musculoskeletal Examination: Normal muscle strength 5/5 to all lower extremity muscle groups bilaterally. Hammertoe(s) noted to the R 2nd toe.Marland Kitchen No pain, crepitus or joint limitation noted with ROM b/l LE.  Patient ambulates independently without assistive aids.  Lab Results  Component Value Date   HGBA1C 7.2 (H) 06/06/2018   No results found. ADA Risk Categorization: Low Risk :  Patient has all of the following: Intact protective sensation No prior foot ulcer  No severe deformity Pedal pulses present  Assessment: 1. Diabetic neuropathy with neurologic complication (HCC)   2. Hammer toe of right foot   3. Encounter for diabetic foot exam [facility])     Plan: -Patient was evaluated and treated. All patient's and/or POA's  questions/concerns answered on today's visit. -Discussed neuropathy symptoms. Advised patient/POA to purchase Nervive Pain Cream or Roll On. Apply to foot/feet before bedtime. -Diabetic foot examination performed today. -Continue diabetic foot care principles: inspect feet daily, monitor glucose as recommended by PCP and/or Endocrinologist, and follow prescribed diet per PCP, Endocrinologist and/or dietician. -Patient to continue soft, supportive shoe gear daily. -Patient/POA to call should there be  question/concern in the interim. Return in about 6 months (around 03/10/2023).  Freddie Breech, DPM

## 2022-09-17 DIAGNOSIS — E1122 Type 2 diabetes mellitus with diabetic chronic kidney disease: Secondary | ICD-10-CM | POA: Diagnosis not present

## 2022-09-17 DIAGNOSIS — M48061 Spinal stenosis, lumbar region without neurogenic claudication: Secondary | ICD-10-CM | POA: Diagnosis not present

## 2022-09-17 DIAGNOSIS — I700 Atherosclerosis of aorta: Secondary | ICD-10-CM | POA: Diagnosis not present

## 2022-09-17 DIAGNOSIS — Z7409 Other reduced mobility: Secondary | ICD-10-CM | POA: Diagnosis not present

## 2022-09-17 DIAGNOSIS — E1140 Type 2 diabetes mellitus with diabetic neuropathy, unspecified: Secondary | ICD-10-CM | POA: Diagnosis not present

## 2022-09-17 DIAGNOSIS — N183 Chronic kidney disease, stage 3 unspecified: Secondary | ICD-10-CM | POA: Diagnosis not present

## 2022-09-17 DIAGNOSIS — E782 Mixed hyperlipidemia: Secondary | ICD-10-CM | POA: Diagnosis not present

## 2022-09-17 DIAGNOSIS — J449 Chronic obstructive pulmonary disease, unspecified: Secondary | ICD-10-CM | POA: Diagnosis not present

## 2022-09-24 DIAGNOSIS — T85192A Other mechanical complication of implanted electronic neurostimulator (electrode) of spinal cord, initial encounter: Secondary | ICD-10-CM | POA: Diagnosis not present

## 2022-09-24 DIAGNOSIS — Z6831 Body mass index (BMI) 31.0-31.9, adult: Secondary | ICD-10-CM | POA: Diagnosis not present

## 2022-09-30 ENCOUNTER — Telehealth: Payer: Self-pay | Admitting: *Deleted

## 2022-09-30 NOTE — Telephone Encounter (Signed)
   Name: ANJALINA LEHTO  DOB: March 04, 1941  MRN: [id number]  Primary Cardiologist: Donato Schultz, MD   Preoperative team, please contact this patient and set up a phone call appointment for further preoperative risk assessment. Please obtain consent and complete medication review. Thank you for your help.  I confirm that guidance regarding antiplatelet and oral anticoagulation therapy has been completed and, if necessary, noted below (none requested).    Joylene Grapes, NP 09/30/2022, 11:32 AM  [facility]

## 2022-09-30 NOTE — Telephone Encounter (Signed)
Pt has been scheduled for tele pre op appt 10/07/22 @ 2:20. Med rec and consent are done.      Patient Consent for Virtual Visit        [name] has provided verbal consent on 09/30/2022 for a virtual visit (video or telephone).   CONSENT FOR VIRTUAL VISIT FOR:  [name]  By participating in this virtual visit I agree to the following:  I hereby voluntarily request, consent and authorize [facility] and its employed or contracted physicians, physician assistants, nurse practitioners or other licensed health care professionals (the Practitioner), to provide me with telemedicine health care services (the "Services") as deemed necessary by the treating Practitioner. I acknowledge and consent to receive the Services by the Practitioner via telemedicine. I understand that the telemedicine visit will involve communicating with the Practitioner through live audiovisual communication technology and the disclosure of certain medical information by electronic transmission. I acknowledge that I have been given the opportunity to request an in-person assessment or other available alternative prior to the telemedicine visit and am voluntarily participating in the telemedicine visit.  I understand that I have the right to withhold or withdraw my consent to the use of telemedicine in the course of my care at any time, without affecting my right to future care or treatment, and that the Practitioner or I may terminate the telemedicine visit at any time. I understand that I have the right to inspect all information obtained and/or recorded in the course of the telemedicine visit and may receive copies of available information for a reasonable fee.  I understand that some of the potential risks of receiving the Services via telemedicine include:  Delay or interruption in medical evaluation due to technological equipment failure or disruption; Information transmitted may not be sufficient  (e.g. poor resolution of images) to allow for appropriate medical decision making by the Practitioner; and/or  In rare instances, security protocols could fail, causing a breach of personal health information.  Furthermore, I acknowledge that it is my responsibility to provide information about my medical history, conditions and care that is complete and accurate to the best of my ability. I acknowledge that Practitioner's advice, recommendations, and/or decision may be based on factors not within their control, such as incomplete or inaccurate data provided by me or distortions of diagnostic images or specimens that may result from electronic transmissions. I understand that the practice of medicine is not an exact science and that Practitioner makes no warranties or guarantees regarding treatment outcomes. I acknowledge that a copy of this consent can be made available to me via my patient portal [facility] MyChart), or I can request a printed copy by calling the office of [facility].    I understand that my insurance will be billed for this visit.   I have read or had this consent read to me. I understand the contents of this consent, which adequately explains the benefits and risks of the Services being provided via telemedicine.  I have been provided ample opportunity to ask questions regarding this consent and the Services and have had my questions answered to my satisfaction. I give my informed consent for the services to be provided through the use of telemedicine in my medical care

## 2022-09-30 NOTE — Telephone Encounter (Signed)
Pt has been scheduled for tele pre op appt 10/07/22 @ 2:20. Med rec and consent are done.

## 2022-09-30 NOTE — Telephone Encounter (Signed)
   Pre-operative Risk Assessment    Patient Name: [name]  DOB: 1941/03/07 MRN: [id number]      Request for Surgical Clearance    Procedure:   Spinal Cord Simulator Battery Removal  Date of Surgery:  Clearance 10/14/22                                 Surgeon:  Dr. Marikay Alar Surgeon's Group or Practice Name:  [facility] Phone number:  [phone] Fax number:  [phone]   Type of Clearance Requested:   - Medical    Type of Anesthesia:  General    Additional requests/questions:    Signed, Emmit Pomfret   09/30/2022, 11:15 AM

## 2022-10-07 ENCOUNTER — Ambulatory Visit: Payer: HMO | Attending: Cardiology

## 2022-10-07 DIAGNOSIS — Z01810 Encounter for preprocedural cardiovascular examination: Secondary | ICD-10-CM | POA: Diagnosis not present

## 2022-10-07 NOTE — Progress Notes (Signed)
Virtual Visit via Telephone Note   Because of [name]'s co-morbid illnesses, she is at least at moderate risk for complications without adequate follow up.  This format is felt to be most appropriate for this patient at this time.  The patient did not have access to video technology/had technical difficulties with video requiring transitioning to audio format only (telephone).  All issues noted in this document were discussed and addressed.  No physical exam could be performed with this format.  Please refer to the patient's chart for her consent to telehealth for [facility].  Evaluation Performed:  Preoperative cardiovascular risk assessment _____________   Date:  10/07/2022   Patient ID:  [name], DOB 1941-04-18, MRN [id number] Patient Location:  Home Provider location:   Office  Primary Care Provider:  Thana Ates, MD Primary Cardiologist:  Donato Schultz, MD  Chief Complaint / Patient Profile   82 y.o. y/o female with a h/o COPD, DVT, GERD, HTN, sleep apnea who is pending spinal cord stimulator battery removal and presents today for telephonic preoperative cardiovascular risk assessment.  History of Present Illness    [name] is a 82 y.o. female who presents via audio/video conferencing for a telehealth visit today.  Pt was last seen in cardiology clinic on 12/10/2021 by Herma Carson, PA.  At that time RANDE KREISHER was doing well with some complaint of chest pain with walking.  She underwent a LHC in 2023 showing normal coronaries.  Patient blood pressure was well-controlled.  The patient is now pending procedure as outlined above. Since her last visit, she has been doing well with no cardiac complaints since her visit.  She uses a walker but is able to ambulate and complete ADLs.  She denies chest pain, shortness of breath, lower extremity edema, fatigue, palpitations, melena, hematuria, hemoptysis, diaphoresis, weakness, presyncope,  syncope, orthopnea, and PND.    Past Medical History    Past Medical History:  Diagnosis Date   Anemia    Arthritis    Battery end of life of spinal cord stimulator 2016   battery replaced.    COPD (chronic obstructive pulmonary disease) (HCC)    'Mild' per pt report   Diabetes mellitus    DVT (deep venous thrombosis) (HCC)    Dyspnea    on exertion   GERD (gastroesophageal reflux disease)    History of kidney stones    History of stomach ulcers    Hypertension    dr Anne Fu   Neuropathy    Rapid heartbeat    Sleep apnea    borderline study; could use a CPAP if desired.    Past Surgical History:  Procedure Laterality Date   ABDOMINAL HYSTERECTOMY  1979 & 1981   ovaries and uterus removed at different times.   APPENDECTOMY     ARTHOSCOPIC ROTAOR CUFF REPAIR Left 2010 or 2011   BACK SURGERY  x 2 1999 and 2008   lower back L 4 to L 5 L5 to S 1   BREAST BIOPSY Bilateral 05/18/1997   CARPAL TUNNEL RELEASE Right 2014   CARPAL TUNNEL RELEASE Bilateral    COLONOSCOPY WITH PROPOFOL N/A 10/25/2012   Procedure: COLONOSCOPY WITH PROPOFOL;  Surgeon: Charolett Bumpers, MD;  Location: [facility];  Service: Endoscopy;  Laterality: N/A;   EYE SURGERY     eyelid surgery   JOINT REPLACEMENT Right 2012   knee   KNEE ARTHROSCOPY Right 2004   LUMBAR LAMINECTOMY/DECOMPRESSION MICRODISCECTOMY Bilateral 03/03/2018  Procedure: Laminectomy and Foraminotomy - Lumbar one-Lumbar two - bilateral Posterior lateral fusion;  Surgeon: Tia Alert, MD;  Location: [facility] OR;  Service: Neurosurgery;  Laterality: Bilateral;   ovarian wedge resection  1966   RIGHT/LEFT HEART CATH AND CORONARY ANGIOGRAPHY N/A 06/11/2021   Procedure: RIGHT/LEFT HEART CATH AND CORONARY ANGIOGRAPHY;  Surgeon: Corky Crafts, MD;  Location: [facility] INVASIVE CV LAB;  Service: Cardiovascular;  Laterality: N/A;   SPINAL CORD STIMULATOR INSERTION N/A 11/03/2012   Procedure: LUMBAR SPINAL CORD STIMULATOR INSERTION;  Surgeon: Venita Lick, MD;  Location: [facility] OR;  Service: Orthopedics;  Laterality: N/A;   SPINAL FUSION  10/2016   TONSILLECTOMY  1960   TOTAL KNEE ARTHROPLASTY Left 06/13/2018   Procedure: TOTAL KNEE ARTHROPLASTY;  Surgeon: Ollen Gross, MD;  Location: [facility]S;  Service: Orthopedics;  Laterality: Left;  Adductor Block    Allergies  Allergies  Allergen Reactions   Hydrochlorothiazide Anaphylaxis and Other (See Comments)    comatose   Ace Inhibitors Cough   Ezetimibe-Simvastatin Other (See Comments)    MYALGIA   Indomethacin Other (See Comments)    PEPTIC ULCERS   Lisinopril Cough   Lyrica [Pregabalin] Other (See Comments)    Weight gain, drowsy   Prednisone Itching, Swelling and Other (See Comments)    Facial redness/swelling    Simvastatin Other (See Comments)    Elevated LFT's   Vioxx [Rofecoxib] Other (See Comments)    Anxiety & chills   Troglitazone Other (See Comments)    Pt is unaware of reaction.   Antihistamines, Chlorpheniramine-Type Other (See Comments)    "HYPER"    Home Medications    Prior to Admission medications   Medication Sig Start Date End Date Taking? Authorizing Provider  Biotin 5000 MCG TABS Take 5,000 mcg by mouth daily.    [provider]  calcium carbonate (OSCAL) 1500 (600 Ca) MG TABS tablet Take 1 tablet by mouth daily.    [provider]  co-enzyme Q-10 30 MG capsule Take 100 mg by mouth 2 (two) times daily.    [provider]  estradiol (ESTRACE) 1 MG tablet Take 1 mg by mouth every evening.     [provider]  fexofenadine (ALLEGRA) 180 MG tablet Take 180 mg by mouth daily as needed for allergies or rhinitis.    [provider]  fluticasone furoate-vilanterol (BREO ELLIPTA) 100-25 MCG/INH AEPB Inhale 1 puff into the lungs daily.    [provider]  furosemide (LASIX) 20 MG tablet Take 20 mg by mouth as needed for fluid. 12/28/17   [provider]  glipiZIDE (GLUCOTROL) 10 MG tablet Take 10 mg by  mouth daily. 08/03/16   [provider]  HYDROcodone-acetaminophen (NORCO) 7.5-325 MG tablet Take 1 tablet by mouth every 4 (four) hours as needed for moderate pain.    [provider]  insulin glargine, 2 Unit Dial, (TOUJEO MAX SOLOSTAR) 300 UNIT/ML Solostar Pen Inject 60 Units into the skin daily.    [provider]  losartan (COZAAR) 100 MG tablet Take 100 mg by mouth every evening.     [provider]  magnesium 30 MG tablet Take 30 mg by mouth 2 (two) times daily.    [provider]  metFORMIN (GLUCOPHAGE) 1000 MG tablet Take 1 tablet (1,000 mg total) by mouth 2 (two) times daily. 06/13/21   Corky Crafts, MD  metoprolol succinate (TOPROL-XL) 50 MG 24 hr tablet Take 1 tablet (50 mg total) by mouth 2 (two) times  daily. 05/20/15   Jake Bathe, MD  Omega-3 Fatty Acids (FISH OIL) 1200 MG CAPS Take 1,200 mg by mouth 2 (two) times daily.    [provider]  omeprazole (PRILOSEC) 40 MG capsule Take 40 mg by mouth as needed (Heartburn). 02/01/19   [provider]  rosuvastatin (CRESTOR) 20 MG tablet Take 1 tablet (20 mg total) by mouth every evening. 12/10/21   Dyann Kief, PA-C  vitamin B-12 (CYANOCOBALAMIN) 500 MCG tablet Take 1,000 mcg by mouth daily.    [provider]    Physical Exam    Vital Signs:  IRIANNA MESSANO does not have vital signs available for review today.  Given telephonic nature of communication, physical exam is limited. AAOx3. NAD. Normal affect.  Speech and respirations are unlabored.  Accessory Clinical Findings    None  Assessment & Plan    1.  Preoperative Cardiovascular Risk Assessment:  Patient's RCRI is 0.4%  The patient affirms she has been doing well without any new cardiac symptoms. They are able to achieve 5 METS without cardiac limitations. Therefore, based on ACC/AHA guidelines, the patient would be at acceptable risk for the planned procedure without further cardiovascular  testing. The patient was advised that if she develops new symptoms prior to surgery to contact our office to arrange for a follow-up visit, and she verbalized understanding.   The patient was advised that if she develops new symptoms prior to surgery to contact our office to arrange for a follow-up visit, and she verbalized understanding.  No medications requested  A copy of this note will be routed to requesting surgeon.  Time:   Today, I have spent 5 minutes with the patient with telehealth technology discussing medical history, symptoms, and management plan.     Napoleon Form, Leodis Rains, NP  10/07/2022, 7:22 AM

## 2022-10-12 ENCOUNTER — Other Ambulatory Visit: Payer: Self-pay | Admitting: Internal Medicine

## 2022-10-12 DIAGNOSIS — Z1231 Encounter for screening mammogram for malignant neoplasm of breast: Secondary | ICD-10-CM

## 2022-10-14 DIAGNOSIS — Z4542 Encounter for adjustment and management of neuropacemaker (brain) (peripheral nerve) (spinal cord): Secondary | ICD-10-CM | POA: Diagnosis not present

## 2022-10-14 DIAGNOSIS — M5459 Other low back pain: Secondary | ICD-10-CM | POA: Diagnosis not present

## 2022-10-14 DIAGNOSIS — T85192A Other mechanical complication of implanted electronic neurostimulator (electrode) of spinal cord, initial encounter: Secondary | ICD-10-CM | POA: Diagnosis not present

## 2022-10-29 DIAGNOSIS — E1142 Type 2 diabetes mellitus with diabetic polyneuropathy: Secondary | ICD-10-CM | POA: Diagnosis not present

## 2022-10-29 DIAGNOSIS — E1169 Type 2 diabetes mellitus with other specified complication: Secondary | ICD-10-CM | POA: Diagnosis not present

## 2022-10-29 DIAGNOSIS — I10 Essential (primary) hypertension: Secondary | ICD-10-CM | POA: Diagnosis not present

## 2022-10-29 DIAGNOSIS — I479 Paroxysmal tachycardia, unspecified: Secondary | ICD-10-CM | POA: Diagnosis not present

## 2022-10-29 DIAGNOSIS — J449 Chronic obstructive pulmonary disease, unspecified: Secondary | ICD-10-CM | POA: Diagnosis not present

## 2022-10-29 DIAGNOSIS — K5900 Constipation, unspecified: Secondary | ICD-10-CM | POA: Diagnosis not present

## 2022-10-29 DIAGNOSIS — G8929 Other chronic pain: Secondary | ICD-10-CM | POA: Diagnosis not present

## 2022-10-29 DIAGNOSIS — H547 Unspecified visual loss: Secondary | ICD-10-CM | POA: Diagnosis not present

## 2022-10-29 DIAGNOSIS — E669 Obesity, unspecified: Secondary | ICD-10-CM | POA: Diagnosis not present

## 2022-10-29 DIAGNOSIS — E785 Hyperlipidemia, unspecified: Secondary | ICD-10-CM | POA: Diagnosis not present

## 2022-10-29 DIAGNOSIS — Z794 Long term (current) use of insulin: Secondary | ICD-10-CM | POA: Diagnosis not present

## 2022-10-29 DIAGNOSIS — E1165 Type 2 diabetes mellitus with hyperglycemia: Secondary | ICD-10-CM | POA: Diagnosis not present

## 2022-11-06 DIAGNOSIS — E538 Deficiency of other specified B group vitamins: Secondary | ICD-10-CM | POA: Diagnosis not present

## 2022-11-06 DIAGNOSIS — I700 Atherosclerosis of aorta: Secondary | ICD-10-CM | POA: Diagnosis not present

## 2022-11-06 DIAGNOSIS — Z0000 Encounter for general adult medical examination without abnormal findings: Secondary | ICD-10-CM | POA: Diagnosis not present

## 2022-11-06 DIAGNOSIS — E611 Iron deficiency: Secondary | ICD-10-CM | POA: Diagnosis not present

## 2022-11-06 DIAGNOSIS — J449 Chronic obstructive pulmonary disease, unspecified: Secondary | ICD-10-CM | POA: Diagnosis not present

## 2022-11-06 DIAGNOSIS — Z23 Encounter for immunization: Secondary | ICD-10-CM | POA: Diagnosis not present

## 2022-11-06 DIAGNOSIS — R300 Dysuria: Secondary | ICD-10-CM | POA: Diagnosis not present

## 2022-11-06 DIAGNOSIS — E1122 Type 2 diabetes mellitus with diabetic chronic kidney disease: Secondary | ICD-10-CM | POA: Diagnosis not present

## 2022-11-06 DIAGNOSIS — Z7409 Other reduced mobility: Secondary | ICD-10-CM | POA: Diagnosis not present

## 2022-11-06 DIAGNOSIS — E782 Mixed hyperlipidemia: Secondary | ICD-10-CM | POA: Diagnosis not present

## 2022-11-06 DIAGNOSIS — Z79899 Other long term (current) drug therapy: Secondary | ICD-10-CM | POA: Diagnosis not present

## 2022-11-06 DIAGNOSIS — E1140 Type 2 diabetes mellitus with diabetic neuropathy, unspecified: Secondary | ICD-10-CM | POA: Diagnosis not present

## 2022-11-06 DIAGNOSIS — N183 Chronic kidney disease, stage 3 unspecified: Secondary | ICD-10-CM | POA: Diagnosis not present

## 2022-11-10 ENCOUNTER — Other Ambulatory Visit: Payer: Self-pay | Admitting: Internal Medicine

## 2022-11-10 DIAGNOSIS — E2839 Other primary ovarian failure: Secondary | ICD-10-CM

## 2022-11-16 ENCOUNTER — Ambulatory Visit
Admission: RE | Admit: 2022-11-16 | Discharge: 2022-11-16 | Disposition: A | Discharge status: Home / Self Care | Payer: PPO | Source: Ambulatory Visit | Attending: Internal Medicine | Admitting: Internal Medicine

## 2022-11-16 DIAGNOSIS — Z1231 Encounter for screening mammogram for malignant neoplasm of breast: Secondary | ICD-10-CM | POA: Diagnosis not present

## 2022-11-25 ENCOUNTER — Ambulatory Visit
Admission: RE | Admit: 2022-11-25 | Discharge: 2022-11-25 | Disposition: A | Discharge status: Home / Self Care | Payer: PPO | Source: Ambulatory Visit | Attending: Internal Medicine | Admitting: Internal Medicine

## 2022-11-25 DIAGNOSIS — J449 Chronic obstructive pulmonary disease, unspecified: Secondary | ICD-10-CM | POA: Diagnosis not present

## 2022-11-25 DIAGNOSIS — N958 Other specified menopausal and perimenopausal disorders: Secondary | ICD-10-CM | POA: Diagnosis not present

## 2022-11-25 DIAGNOSIS — E119 Type 2 diabetes mellitus without complications: Secondary | ICD-10-CM | POA: Diagnosis not present

## 2022-11-25 DIAGNOSIS — E349 Endocrine disorder, unspecified: Secondary | ICD-10-CM | POA: Diagnosis not present

## 2022-11-25 DIAGNOSIS — E2839 Other primary ovarian failure: Secondary | ICD-10-CM

## 2022-12-01 DIAGNOSIS — M5416 Radiculopathy, lumbar region: Secondary | ICD-10-CM | POA: Diagnosis not present

## 2023-01-04 NOTE — Progress Notes (Unsigned)
Cardiology Office Note    Date:  01/05/2023  ID:  [name], Lori 18-May-1940, MRN [id number] PCP:  Thana Ates, MD  Cardiologist:  Donato Schultz, MD  Electrophysiologist:  None   Chief Complaint: f/u chest pain, elevated HR  History of Present Illness: .    [name] is a 82 y.o. female with visit-pertinent history of chest pain, elevated HR, HLD, anemia, arthritis, COPD, remote DVT provoked by flight to [place], DM, stomach ulcers, kidney stones, borderline sleep apnea, neuropathy presented for follow-up. The patient reports that she had remotely established with Dr. Anne Fu way back when he was with [facility], and was put on metoprolol for elevated HR, no specific details on arrhythmia though she declines h/o Afib. Dr. Anne Fu' note from 2017 remarks that she had prior event monitor which was unremarkable. She has done well on metoprolol over the years. Chart outlines long history of CP + DOE with last cath 06/2021 showing no evidence of CAD, normal right heart pressures and normal LVEDP. 2D Echo 05/2021 showed EF 60-65%, G1DD, mild LAE, trivial MR, aortic sclerosis without stenosis. This was ultimately felt due to deconditioning. She has also seen pulmonary who report that CPX showed normal exercise capacity with no cardiac or pulmonary limitations. ABIs 2020 were normal.  She is seen for follow-up today reporting she is doing well from a cardiac standpoint without any new symptoms. She is not describing any chest pain, palpitations, myalgias, syncope or dizziness.   Labwork independently reviewed: [facility] 10/2022 Cr 1.19, Hgb 12.3, LDL 103, K 5.0, trig 246, HDL 74 04/2022 K 4.5, Cr 1.07, Ca 11.2, AST/ALT OK, A1c 7.3, Hgb 12.2, plt 266 03/2022 LDL 95, trig 139 [facility] TSH 2023 wnl  ROS: .    Please see the history of present illness. Otherwise, review of systems is positive for NSR 73bpm, LAFB, left axis deviation, poor R wave progression.  All other systems are reviewed and otherwise  negative.  Studies Reviewed: Marland Kitchen    EKG:  EKG is ordered today, personally reviewed, demonstrating NSR 73bpm, LAFB, left axis deviation, poor R wave progression  CV Studies: Cardiac studies reviewed are outlined and summarized above. Otherwise please see EMR for full report.   Current Reported Medications:.    Current Meds  Medication Sig   albuterol (VENTOLIN HFA) 108 (90 Base) MCG/ACT inhaler as needed for shortness of breath or wheezing.   ALPRAZolam (XANAX) 0.25 MG tablet as needed for anxiety.   Biotin 5000 MCG TABS Take 5,000 mcg by mouth daily.   calcium carbonate (OSCAL) 1500 (600 Ca) MG TABS tablet Take 1 tablet by mouth daily.   fexofenadine (ALLEGRA) 180 MG tablet Take 180 mg by mouth daily as needed for allergies or rhinitis.   fluticasone furoate-vilanterol (BREO ELLIPTA) 100-25 MCG/INH AEPB Inhale 1 puff into the lungs daily.   furosemide (LASIX) 20 MG tablet Take 20 mg by mouth as needed for fluid.   HYDROcodone-acetaminophen (NORCO) 7.5-325 MG tablet Take 1 tablet by mouth every 4 (four) hours as needed for moderate pain.   insulin glargine, 2 Unit Dial, (TOUJEO MAX SOLOSTAR) 300 UNIT/ML Solostar Pen Inject 60 Units into the skin daily.   losartan (COZAAR) 100 MG tablet Take 100 mg by mouth every evening.    magnesium gluconate (MAGONATE) 500 MG tablet Take 1,000 mg by mouth 2 (two) times daily.   metFORMIN (GLUCOPHAGE) 1000 MG tablet Take 1 tablet (1,000 mg total) by mouth 2 (two) times daily.   metoprolol succinate (  TOPROL-XL) 50 MG 24 hr tablet Take 1 tablet (50 mg total) by mouth 2 (two) times daily.   Omega-3 Fatty Acids (FISH OIL) 1200 MG CAPS Take 1,200 mg by mouth 2 (two) times daily.   omeprazole (PRILOSEC) 40 MG capsule Take 40 mg by mouth as needed (Heartburn).   OZEMPIC, 0.25 OR 0.5 MG/DOSE, 2 MG/3ML SOPN Inject into the skin once a week.   rosuvastatin (CRESTOR) 20 MG tablet Take 1 tablet (20 mg total) by mouth every evening.   [DISCONTINUED] co-enzyme Q-10 30  MG capsule Take 100 mg by mouth 2 (two) times daily.   [DISCONTINUED] estradiol (ESTRACE) 1 MG tablet Take 1 mg by mouth every evening.    [DISCONTINUED] glipiZIDE (GLUCOTROL) 10 MG tablet Take 10 mg by mouth daily.   [DISCONTINUED] magnesium 30 MG tablet Take 30 mg by mouth 2 (two) times daily.   [DISCONTINUED] vitamin B-12 (CYANOCOBALAMIN) 500 MCG tablet Take 1,000 mcg by mouth daily.    Physical Exam:    VS:  BP 118/70   Pulse 75   Ht 5' 2.5" (1.588 m)   Wt 195 lb (88.5 kg)   LMP  (LMP Unknown)   SpO2 98%   BMI 35.10 kg/m    Wt Readings from Last 3 Encounters:  01/05/23 195 lb (88.5 kg)  12/10/21 205 lb (93 kg)  09/19/21 204 lb (92.5 kg)    GEN: Well nourished, well developed in no acute distress NECK: No JVD; No carotid bruits CARDIAC: RRR, no murmurs, rubs, gallops RESPIRATORY:  Clear to auscultation without rales, wheezing or rhonchi  ABDOMEN: Soft, non-tender, non-distended EXTREMITIES:  No edema; No acute deformity   Asessement and Plan:.    1. H/o chest pain/dyspnea - felt noncardiac in nature. She denies any accelerating symptoms and feels she is doing well from a cardiac standpoint. No additional testing indicated at this time.  2. Hyperlipidemia - does carry hx of aortic atherosclerosis remotely but no CAD. Discussed intensification of statin therapy to a goal LDL <70 but she prefers to keep at present dose. Her PCP had most recently followed her lipids and I think this can be managed in primary care going forward.  3. Hypercalcemia - noted on outside labs in 2023. I am not able to see the full panel from her PCP's labs in 2024, but recommended she touch base with their office to ensure that had normalized.  4. Diastolic dysfunction without heart failure - no concerns today. Has not filled Lasix in a long time. We discussed observation for recurrent symptoms.  5. Tachycardia - quiescent on metoprolol. Dr. Anne Fu has not filled this rx since 2017; primary care has  assumed refills. No change in regimen today.    Disposition: Given reassuring cardiac testing and conditions that can be managed in primary care, we discussed scheduled f/u versus f/u PRN. She elects the latter. She will notify us in the future if she has any recurrent concerns.  Signed, Laurann Montana, PA-C

## 2023-01-05 ENCOUNTER — Ambulatory Visit: Payer: PPO | Attending: Physician Assistant | Admitting: Physician Assistant

## 2023-01-05 ENCOUNTER — Encounter: Payer: Self-pay | Admitting: Physician Assistant

## 2023-01-05 VITALS — BP 118/70 | HR 75 | Ht 62.5 in | Wt 195.0 lb

## 2023-01-05 DIAGNOSIS — E785 Hyperlipidemia, unspecified: Secondary | ICD-10-CM

## 2023-01-05 DIAGNOSIS — R079 Chest pain, unspecified: Secondary | ICD-10-CM | POA: Diagnosis not present

## 2023-01-05 DIAGNOSIS — I5189 Other ill-defined heart diseases: Secondary | ICD-10-CM

## 2023-01-05 DIAGNOSIS — R000 Tachycardia, unspecified: Secondary | ICD-10-CM | POA: Diagnosis not present

## 2023-01-05 NOTE — Patient Instructions (Addendum)
Medication Instructions:  Your physician recommends that you continue on your current medications as directed. Please refer to the Current Medication list given to you today.  *If you need a refill on your cardiac medications before your next appointment, please call your pharmacy*  Lab Work: If you have labs (blood work) drawn today and your tests are completely normal, you will receive your results only by: MyChart Message (if you have MyChart) OR A paper copy in the mail If you have any lab test that is abnormal or we need to change your treatment, we will call you to review the results.  Follow-Up: At [facility], you and your health needs are our priority.  As part of our continuing mission to provide you with exceptional heart care, we have created designated Provider Care Teams.  These Care Teams include your primary Cardiologist (physician) and Advanced Practice Providers (APPs -  Physician Assistants and Nurse Practitioners) who all work together to provide you with the care you need, when you need it.  We recommend signing up for the patient portal called "MyChart".  Sign up information is provided on this After Visit Summary.  MyChart is used to connect with patients for Virtual Visits (Telemedicine).  Patients are able to view lab/test results, encounter notes, upcoming appointments, etc.  Non-urgent messages can be sent to your provider as well.   To learn more about what you can do with MyChart, go to [email or web address].    Your next appointment:   AS NEEDED  Provider:   Donato Schultz, MD

## 2023-02-24 DIAGNOSIS — M533 Sacrococcygeal disorders, not elsewhere classified: Secondary | ICD-10-CM | POA: Diagnosis not present

## 2023-03-10 ENCOUNTER — Ambulatory Visit: Payer: PPO | Admitting: Podiatry

## 2023-03-10 DIAGNOSIS — M533 Sacrococcygeal disorders, not elsewhere classified: Secondary | ICD-10-CM | POA: Diagnosis not present

## 2023-03-11 DIAGNOSIS — Z23 Encounter for immunization: Secondary | ICD-10-CM | POA: Diagnosis not present

## 2023-03-11 DIAGNOSIS — J449 Chronic obstructive pulmonary disease, unspecified: Secondary | ICD-10-CM | POA: Diagnosis not present

## 2023-03-11 DIAGNOSIS — E1122 Type 2 diabetes mellitus with diabetic chronic kidney disease: Secondary | ICD-10-CM | POA: Diagnosis not present

## 2023-03-11 DIAGNOSIS — R194 Change in bowel habit: Secondary | ICD-10-CM | POA: Diagnosis not present

## 2023-03-11 DIAGNOSIS — K9089 Other intestinal malabsorption: Secondary | ICD-10-CM | POA: Diagnosis not present

## 2023-03-11 DIAGNOSIS — N183 Chronic kidney disease, stage 3 unspecified: Secondary | ICD-10-CM | POA: Diagnosis not present

## 2023-03-11 DIAGNOSIS — E1140 Type 2 diabetes mellitus with diabetic neuropathy, unspecified: Secondary | ICD-10-CM | POA: Diagnosis not present

## 2023-03-11 DIAGNOSIS — R6889 Other general symptoms and signs: Secondary | ICD-10-CM | POA: Diagnosis not present

## 2023-03-11 DIAGNOSIS — Z794 Long term (current) use of insulin: Secondary | ICD-10-CM | POA: Diagnosis not present

## 2023-03-22 DIAGNOSIS — R1031 Right lower quadrant pain: Secondary | ICD-10-CM | POA: Diagnosis not present

## 2023-03-22 DIAGNOSIS — R8290 Unspecified abnormal findings in urine: Secondary | ICD-10-CM | POA: Diagnosis not present

## 2023-03-22 DIAGNOSIS — R1030 Lower abdominal pain, unspecified: Secondary | ICD-10-CM | POA: Diagnosis not present

## 2023-04-07 DIAGNOSIS — R1031 Right lower quadrant pain: Secondary | ICD-10-CM | POA: Diagnosis not present

## 2023-04-28 ENCOUNTER — Ambulatory Visit (HOSPITAL_BASED_OUTPATIENT_CLINIC_OR_DEPARTMENT_OTHER)
Admission: RE | Admit: 2023-04-28 | Discharge: 2023-04-28 | Disposition: A | Discharge status: Home / Self Care | Payer: PPO | Source: Ambulatory Visit | Attending: Internal Medicine | Admitting: Internal Medicine

## 2023-04-28 ENCOUNTER — Other Ambulatory Visit (HOSPITAL_COMMUNITY): Payer: Self-pay | Admitting: Internal Medicine

## 2023-04-28 DIAGNOSIS — R1031 Right lower quadrant pain: Secondary | ICD-10-CM | POA: Insufficient documentation

## 2023-04-28 DIAGNOSIS — N390 Urinary tract infection, site not specified: Secondary | ICD-10-CM | POA: Diagnosis not present

## 2023-04-28 DIAGNOSIS — N281 Cyst of kidney, acquired: Secondary | ICD-10-CM | POA: Diagnosis not present

## 2023-04-28 DIAGNOSIS — K8020 Calculus of gallbladder without cholecystitis without obstruction: Secondary | ICD-10-CM | POA: Diagnosis not present

## 2023-04-28 DIAGNOSIS — K5730 Diverticulosis of large intestine without perforation or abscess without bleeding: Secondary | ICD-10-CM | POA: Diagnosis not present

## 2023-04-28 DIAGNOSIS — N200 Calculus of kidney: Secondary | ICD-10-CM | POA: Diagnosis not present

## 2023-04-28 DIAGNOSIS — R0989 Other specified symptoms and signs involving the circulatory and respiratory systems: Secondary | ICD-10-CM | POA: Diagnosis not present

## 2023-04-28 LAB — POCT I-STAT CREATININE: Creatinine, Ser: 1.3 mg/dL — ABNORMAL HIGH (ref 0.44–1.00)

## 2023-04-28 MED ORDER — IOHEXOL 300 MG/ML  SOLN
100.0000 mL | Freq: Once | INTRAMUSCULAR | Status: AC | PRN
  Administered 2023-04-28: 80 mL via INTRAVENOUS

## 2023-05-11 DIAGNOSIS — D509 Iron deficiency anemia, unspecified: Secondary | ICD-10-CM | POA: Diagnosis not present

## 2023-06-18 DIAGNOSIS — R350 Frequency of micturition: Secondary | ICD-10-CM | POA: Diagnosis not present

## 2023-07-23 DIAGNOSIS — F411 Generalized anxiety disorder: Secondary | ICD-10-CM | POA: Diagnosis not present

## 2023-07-23 DIAGNOSIS — E1140 Type 2 diabetes mellitus with diabetic neuropathy, unspecified: Secondary | ICD-10-CM | POA: Diagnosis not present

## 2023-07-23 DIAGNOSIS — K219 Gastro-esophageal reflux disease without esophagitis: Secondary | ICD-10-CM | POA: Diagnosis not present

## 2023-07-23 DIAGNOSIS — K5909 Other constipation: Secondary | ICD-10-CM | POA: Diagnosis not present
